# Patient Record
Sex: Male | Born: 1950
Health system: Southern US, Community
[De-identification: ages and names within clinical notes are randomized; demographics above are authoritative.]

## PROBLEM LIST (undated history)

## (undated) DIAGNOSIS — G459 Transient cerebral ischemic attack, unspecified: Secondary | ICD-10-CM

## (undated) DIAGNOSIS — Z89519 Acquired absence of unspecified leg below knee: Secondary | ICD-10-CM

## (undated) DIAGNOSIS — E119 Type 2 diabetes mellitus without complications: Secondary | ICD-10-CM

## (undated) DIAGNOSIS — E7800 Pure hypercholesterolemia, unspecified: Secondary | ICD-10-CM

## (undated) DIAGNOSIS — I10 Essential (primary) hypertension: Secondary | ICD-10-CM

## (undated) DIAGNOSIS — F329 Major depressive disorder, single episode, unspecified: Secondary | ICD-10-CM

## (undated) DIAGNOSIS — F32A Depression, unspecified: Secondary | ICD-10-CM

## (undated) DIAGNOSIS — I639 Cerebral infarction, unspecified: Secondary | ICD-10-CM

## (undated) DIAGNOSIS — F0390 Unspecified dementia without behavioral disturbance: Secondary | ICD-10-CM

## (undated) HISTORY — DX: Major depressive disorder, single episode, unspecified: F32.9

## (undated) HISTORY — DX: Acquired absence of unspecified leg below knee: Z89.519

## (undated) HISTORY — DX: Essential (primary) hypertension: I10

## (undated) HISTORY — DX: Cerebral infarction, unspecified: I63.9

## (undated) HISTORY — DX: Unspecified dementia, unspecified severity, without behavioral disturbance, psychotic disturbance, mood disturbance, and anxiety: F03.90

## (undated) HISTORY — PX: OTHER SURGICAL HISTORY: SHX169

## (undated) HISTORY — DX: Type 2 diabetes mellitus without complications: E11.9

## (undated) HISTORY — DX: Transient cerebral ischemic attack, unspecified: G45.9

## (undated) HISTORY — DX: Depression, unspecified: F32.A

## (undated) HISTORY — PX: SHOULDER SURGERY: SHX246

---

## 2007-06-25 ENCOUNTER — Ambulatory Visit (HOSPITAL_COMMUNITY): Payer: Self-pay | Admitting: Psychiatry

## 2007-06-30 ENCOUNTER — Ambulatory Visit (HOSPITAL_COMMUNITY): Payer: Self-pay | Admitting: Psychiatry

## 2007-08-20 ENCOUNTER — Ambulatory Visit (HOSPITAL_COMMUNITY): Admission: RE | Admit: 2007-08-20 | Discharge: 2007-08-20 | Payer: Self-pay | Admitting: Ophthalmology

## 2007-10-06 ENCOUNTER — Ambulatory Visit (HOSPITAL_COMMUNITY): Payer: Self-pay | Admitting: Psychiatry

## 2007-10-19 ENCOUNTER — Encounter: Admission: RE | Admit: 2007-10-19 | Discharge: 2007-11-19 | Payer: Self-pay | Admitting: Surgical Oncology

## 2007-10-20 ENCOUNTER — Ambulatory Visit (HOSPITAL_COMMUNITY): Payer: Self-pay | Admitting: Psychiatry

## 2008-02-09 ENCOUNTER — Ambulatory Visit (HOSPITAL_COMMUNITY): Payer: Self-pay | Admitting: Psychiatry

## 2008-02-19 ENCOUNTER — Ambulatory Visit (HOSPITAL_COMMUNITY): Payer: Self-pay | Admitting: Psychiatry

## 2008-06-01 ENCOUNTER — Ambulatory Visit (HOSPITAL_COMMUNITY): Payer: Self-pay | Admitting: Psychiatry

## 2008-06-07 ENCOUNTER — Ambulatory Visit (HOSPITAL_COMMUNITY): Payer: Self-pay | Admitting: Psychiatry

## 2008-06-15 ENCOUNTER — Ambulatory Visit (HOSPITAL_COMMUNITY): Payer: Self-pay | Admitting: Psychiatry

## 2008-08-04 ENCOUNTER — Ambulatory Visit (HOSPITAL_COMMUNITY): Payer: Self-pay | Admitting: Psychiatry

## 2008-08-23 ENCOUNTER — Ambulatory Visit (HOSPITAL_COMMUNITY): Payer: Self-pay | Admitting: Psychiatry

## 2008-09-20 ENCOUNTER — Ambulatory Visit (HOSPITAL_COMMUNITY): Payer: Self-pay | Admitting: Psychiatry

## 2008-10-31 ENCOUNTER — Ambulatory Visit: Payer: Self-pay | Admitting: Surgery

## 2009-02-07 ENCOUNTER — Ambulatory Visit (HOSPITAL_COMMUNITY): Payer: Self-pay | Admitting: Psychiatry

## 2009-02-28 ENCOUNTER — Ambulatory Visit (HOSPITAL_COMMUNITY): Payer: Self-pay | Admitting: Psychiatry

## 2009-03-20 ENCOUNTER — Ambulatory Visit (HOSPITAL_COMMUNITY): Payer: Self-pay | Admitting: Psychiatry

## 2009-03-30 ENCOUNTER — Ambulatory Visit (HOSPITAL_COMMUNITY): Payer: Self-pay | Admitting: Psychiatry

## 2009-04-11 ENCOUNTER — Ambulatory Visit (HOSPITAL_COMMUNITY): Payer: Self-pay | Admitting: Psychiatry

## 2009-04-27 ENCOUNTER — Ambulatory Visit (HOSPITAL_COMMUNITY): Payer: Self-pay | Admitting: Psychiatry

## 2009-05-25 ENCOUNTER — Ambulatory Visit (HOSPITAL_COMMUNITY): Payer: Self-pay | Admitting: Psychiatry

## 2009-06-29 ENCOUNTER — Ambulatory Visit (HOSPITAL_COMMUNITY): Payer: Self-pay | Admitting: Psychiatry

## 2009-07-07 ENCOUNTER — Ambulatory Visit (HOSPITAL_COMMUNITY): Payer: Self-pay | Admitting: Psychology

## 2009-07-27 ENCOUNTER — Ambulatory Visit (HOSPITAL_COMMUNITY): Payer: Self-pay | Admitting: Psychiatry

## 2009-08-03 ENCOUNTER — Ambulatory Visit (HOSPITAL_COMMUNITY): Payer: Self-pay | Admitting: Psychology

## 2009-08-23 ENCOUNTER — Ambulatory Visit (HOSPITAL_COMMUNITY): Payer: Self-pay | Admitting: Psychology

## 2009-09-07 ENCOUNTER — Ambulatory Visit (HOSPITAL_COMMUNITY): Payer: Self-pay | Admitting: Psychology

## 2009-09-21 ENCOUNTER — Ambulatory Visit (HOSPITAL_COMMUNITY): Payer: Self-pay | Admitting: Psychiatry

## 2009-10-12 ENCOUNTER — Ambulatory Visit (HOSPITAL_COMMUNITY): Payer: Self-pay | Admitting: Psychology

## 2009-11-02 ENCOUNTER — Ambulatory Visit (HOSPITAL_COMMUNITY): Payer: Self-pay | Admitting: Psychiatry

## 2009-11-30 ENCOUNTER — Ambulatory Visit (HOSPITAL_COMMUNITY): Payer: Self-pay | Admitting: Psychology

## 2009-12-21 ENCOUNTER — Ambulatory Visit (HOSPITAL_COMMUNITY): Payer: Self-pay | Admitting: Psychiatry

## 2010-02-22 ENCOUNTER — Ambulatory Visit (HOSPITAL_COMMUNITY): Admit: 2010-02-22 | Payer: Self-pay | Admitting: Psychiatry

## 2010-02-22 ENCOUNTER — Encounter (HOSPITAL_COMMUNITY): Payer: Self-pay | Admitting: Psychiatry

## 2010-03-05 ENCOUNTER — Encounter (INDEPENDENT_AMBULATORY_CARE_PROVIDER_SITE_OTHER): Payer: 59 | Admitting: Surgery

## 2010-03-05 ENCOUNTER — Other Ambulatory Visit (INDEPENDENT_AMBULATORY_CARE_PROVIDER_SITE_OTHER): Payer: 59

## 2010-03-05 DIAGNOSIS — E1159 Type 2 diabetes mellitus with other circulatory complications: Secondary | ICD-10-CM

## 2010-03-05 DIAGNOSIS — I63239 Cerebral infarction due to unspecified occlusion or stenosis of unspecified carotid arteries: Secondary | ICD-10-CM

## 2010-03-05 DIAGNOSIS — L98499 Non-pressure chronic ulcer of skin of other sites with unspecified severity: Secondary | ICD-10-CM

## 2010-03-06 ENCOUNTER — Encounter (HOSPITAL_COMMUNITY): Payer: Self-pay | Admitting: Psychiatry

## 2010-03-06 NOTE — Assessment & Plan Note (Signed)
OFFICE VISIT  Chad Grant, Chad Grant DOB:  1950-05-19                                       03/05/2010 CHART#:[id number]  REASON FOR CONSULT:  Right great toe ulcer.  REFERRING PHYSICIAN:  Jake Shark A. Tanda Rockers, M.D.  PRIMARY FOOT SPECIALIST:  Denny Peon. Ulice Brilliant, D.P.M.  HISTORY:  The patient is a very pleasant 60 year old gentleman who I am seeing at the request of Dr. Tanda Rockers for evaluation of a right great toe ulcer.  The patient states that the ulcer has been there since approximately October.  Dr. Ulice Brilliant performed treatments with Silvadene as well as Regranex.  However, this failed to get his wound to heal.  He recently saw Dr. Tanda Rockers who has performed debridement as well as a bone biopsy.  He has also started on hyperbaric oxygen.  The patient states that the wound is slowly getting better.  He denies fevers, chills, redness, drainage, or foul odor.  The patient suffers from diabetes.  His blood sugars are moderately well controlled; they are usually in the 150s.  He also is medically managed for his hypertension and hypercholesterolemia.  The patient had a history of stroke in 2010 which has left him with some memory difficulties.  He has also had a left below-knee amputation.  REVIEW OF SYSTEMS:  VASCULAR:  Positive for nonhealing ulcer and stroke. GI:  Positive for reflux. MUSCULOSKELETAL:  Positive for joint pain. PSYCH:  Positive for anxiety. SKIN:  Positive for right great toe ulcer. All other review of systems are negative.  PAST MEDICAL HISTORY:  Diabetes, hypertension, hypercholesterolemia, peripheral vascular disease, history of stroke in 2010.  SOCIAL HISTORY:  He is married with 2 children.  Does not smoke or drink.  Has never smoked.  FAMILY HISTORY:  Positive for stroke in a sister at age 47.  PHYSICAL EXAMINATION:  Vital signs:  Heart rate 68, blood pressure 137/71, temperature is 97.8.  General:  Well-appearing in no distress. HEENT:   Within normal limits.  Lungs:  Clear bilaterally.  No wheezes or rhonchi.  Cardiovascular:  Regular rate and rhythm.  No carotid bruits. I cannot palpate pedal pulses.  Abdomen:  Soft, nontender. Musculoskeletal:  Left below-knee amputation.  Neuro:  No focal deficits.  Skin:  Without rash.  There is an ulcer on the tip of right great toe which does not have any evidence of infection.  Vascular lab studies were performed.  Although his ABIs are near normal, he has dampened wave forms.  ASSESSMENT:  Right great toe ulcer.  PLAN:  I have discussed with the patient that while his Doppler study shows he has relatively good blood flow, the wave forms are somewhat dampened.  I feel like in the setting with a diabetic with nonhealing wound that we at least need to proceed with a formal angiogram to confirm the ultrasound findings or to determine if he needs revascularization.  In the meantime, we will continue with his wound care and hyperbaric therapy.  I have scheduled his angiogram for this Wednesday.  Two years ago, the patient had a stroke and had an ultrasound which showed approximately 70% left common carotid stenosis.  This was on the contralateral side of his stroke.  I do not believe this has been evaluated since that time.  We will get a formal carotid ultrasound today in the office.    Lala Lund  Basilio Cairo, MD Electronically Signed  VWB/MEDQ  D:  03/05/2010  T:  03/06/2010  Job:  [id number]  cc:   Kirstie Peri, MD Theresia Majors. Tanda Rockers, M.D. Denny Peon. Ulice Brilliant, D.P.M.

## 2010-03-07 ENCOUNTER — Ambulatory Visit (HOSPITAL_COMMUNITY)
Admission: RE | Admit: 2010-03-07 | Discharge: 2010-03-07 | Disposition: A | Discharge status: Home / Self Care | Payer: 59 | Source: Ambulatory Visit | Attending: Surgery | Admitting: Surgery

## 2010-03-07 DIAGNOSIS — Z01810 Encounter for preprocedural cardiovascular examination: Secondary | ICD-10-CM | POA: Insufficient documentation

## 2010-03-07 DIAGNOSIS — L97509 Non-pressure chronic ulcer of other part of unspecified foot with unspecified severity: Secondary | ICD-10-CM | POA: Insufficient documentation

## 2010-03-07 DIAGNOSIS — L98499 Non-pressure chronic ulcer of skin of other sites with unspecified severity: Secondary | ICD-10-CM

## 2010-03-07 DIAGNOSIS — Z01812 Encounter for preprocedural laboratory examination: Secondary | ICD-10-CM | POA: Insufficient documentation

## 2010-03-07 DIAGNOSIS — I739 Peripheral vascular disease, unspecified: Secondary | ICD-10-CM

## 2010-03-07 LAB — GLUCOSE, CAPILLARY: Glucose-Capillary: 130 mg/dL — ABNORMAL HIGH (ref 70–99)

## 2010-03-07 LAB — POCT I-STAT, CHEM 8
BUN: 15 mg/dL (ref 6–23)
Calcium, Ion: 1.22 mmol/L (ref 1.12–1.32)
Chloride: 105 meq/L (ref 96–112)
Creatinine, Ser: 1.1 mg/dL (ref 0.4–1.5)
Glucose, Bld: 129 mg/dL — ABNORMAL HIGH (ref 70–99)
HCT: 40 % (ref 39.0–52.0)
Hemoglobin: 13.6 g/dL (ref 13.0–17.0)
Potassium: 4.7 meq/L (ref 3.5–5.1)
Sodium: 138 meq/L (ref 135–145)
TCO2: 24 mmol/L (ref 0–100)

## 2010-03-13 NOTE — Procedures (Unsigned)
CAROTID DUPLEX EXAM  INDICATION:  CVA, carotid disease.  HISTORY: Diabetes:  Yes. Cardiac:  No. Hypertension:  No. Smoking: Previous Surgery: CV History:  CVA. Amaurosis Fugax No, Paresthesias No, Hemiparesis Yes.                                      RIGHT             LEFT Brachial systolic pressure:                           137 Brachial Doppler waveforms: Vertebral direction of flow:        Antegrade         Antegrade DUPLEX VELOCITIES (cm/sec) CCA peak systolic                   117               306 ECA peak systolic                   67                62 ICA peak systolic                   71                67 ICA end diastolic                   16                18 PLAQUE MORPHOLOGY:                  Heterogenous Heterogenous/smooth PLAQUE AMOUNT:                      Mild              Moderate PLAQUE LOCATION:                    ICA/ECA           CCA/ICA/ECA  IMPRESSION: 1. 1% to 39% bilateral internal carotid artery stenosis. 2. Bilateral external carotid artery waveforms appear slightly     abnormal. 3. Left common carotid artery is abnormal with significant plaquing. 4. Bilateral vertebral arteries are within normal limits.  ___________________________________________ V. Charlena Cross, MD  LT/MEDQ  D:  03/05/2010  T:  03/05/2010  Job:  [id number]

## 2010-03-19 ENCOUNTER — Encounter: Payer: Medicare Other | Admitting: Surgery

## 2010-03-20 ENCOUNTER — Encounter (INDEPENDENT_AMBULATORY_CARE_PROVIDER_SITE_OTHER): Payer: 59 | Admitting: Psychiatry

## 2010-03-20 DIAGNOSIS — F329 Major depressive disorder, single episode, unspecified: Secondary | ICD-10-CM

## 2010-03-25 NOTE — Op Note (Addendum)
  [name], [name]                ACCOUNT NO.:  [id number]  MEDICAL RECORD NO.:  [id number]           PATIENT TYPE:  O  LOCATION:  [facility]                         FACILITY:  [facility]  PHYSICIAN:  Juleen China IV, MDDATE OF BIRTH:  Jul 13, 1950  DATE OF PROCEDURE:  03/07/2010 DATE OF DISCHARGE:  03/07/2010                              OPERATIVE REPORT   PREOPERATIVE DIAGNOSIS:  Right toe ulcer.  POSTOPERATIVE DIAGNOSIS:  Right toe ulcer.  PROCEDURE PERFORMED: 1. Ultrasound access, left femoral artery. 2. Abdominal aortogram. 3. Right leg runoff. 4. Second-order catheterization.  INDICATIONS:  This is a 60 year old gentleman with history of left leg amputation, who has had a ulcer on his right toe.  He had duplex which showed monophasic waveforms, but normal near normal ankle-brachial indices.  He comes in today for formal angiographic evaluation and possible intervention.  PROCEDURE:  The patient was identified in the holding area and taken to room #8, placed supine on the table.  The both groins were prepped and draped in usual fashion.  Time-out was called.  The left femoral artery was evaluated with ultrasound and found to be widely patent.  A digital image was acquired.  Anesthetized with 1% lidocaine.  The left femoral artery was accessed under ultrasound guidance with an 18-gauge needle. An 0.035 wire was advanced into the aorta under fluoroscopic visualization.  A 5-French sheath was placed.  Over the wire, Omniflush catheter was advanced to the level of L1.  Abdominal aortogram was obtained.  Next, using Omniflush catheter and Bentson wire, the area of bifurcation was crossed.  Catheter was placed in the right external iliac artery and right leg runoff was performed.  FINDINGS:  Aortogram:  The visualized portions of suprarenal abdominal aorta showed no significant disease.  There are single renal arteries which are patent without significant stenosis bilaterally.   The infrarenal abdominal aorta is widely patent.  Bilateral common iliac arteries are widely patent.  Bilateral hypogastric arteries are widely patent.  Bilateral external iliac arteries are widely patent.  Right lower extremity:  The right common femoral artery is widely patent. The right profunda femoral artery is widely patent. The right superficial femoral artery is widely patent. The right popliteal artery is widely patent above and below the knee.  The anterior tibial and posterior tibial arteries are occluded.  There is a single-vessel runoff via the peroneal artery.  There is reconstitution of the dorsalis pedis at the ankle from the peroneal collaterals.  At this point in time, decision made to terminate the procedure.  No revascularization options were indicated.  The patient was taken to holding area for sheath pull.  IMPRESSION: 1. No significant aortoiliac occlusive disease. 2. Widely patent right superficial femoral and popliteal artery. 3. Single-vessel runoff via the peroneal artery, which reconstitutes     the dorsalis pedis at the ankle.     Chad Ny, MD     VWB/MEDQ  D:  03/07/2010  T:  03/07/2010  Job:  [id number]  Electronically Signed by Arelia Longest IV MD on 03/25/2010 07:39:41 PM

## 2010-03-26 ENCOUNTER — Encounter: Payer: Self-pay | Admitting: Surgery

## 2010-04-16 ENCOUNTER — Ambulatory Visit: Payer: 59 | Admitting: Surgery

## 2010-04-16 ENCOUNTER — Ambulatory Visit (INDEPENDENT_AMBULATORY_CARE_PROVIDER_SITE_OTHER): Payer: 59 | Admitting: Surgery

## 2010-04-16 DIAGNOSIS — L98499 Non-pressure chronic ulcer of skin of other sites with unspecified severity: Secondary | ICD-10-CM

## 2010-04-17 NOTE — Assessment & Plan Note (Signed)
OFFICE VISIT  Chad Grant, Chad Grant DOB:  February 11, 1950                                       04/16/2010 CHART#:[id number]  Patient comes back today for follow-up of his right great toe ulcer which has been there since approximately October.  He underwent an angiogram on 02/15, which showed a single vessel runoff via the peroneal artery which had a brisk collateral supply getting blood flow on the foot.  His anterior tibial and posterior tibial arteries were chronically occluded.  Based on his angiogram, I felt he should have adequate blood supply to heal his wound.  No percutaneous revascularization options were needed.  He comes back today for followup.  He has recently had the nail bed removed with concern over fungus.  He is undergoing continued hyperbaric oxygen treatments.  PHYSICAL EXAMINATION:  Heart rate 80, blood pressure 146/91, temperature 97.8.  Generally, he is well-appearing in no distress.  The left leg is surgically absent.  The right great toe has an area of ulceration where the nail bed has been removed.  There is some pink granulation tissue there as well as some areas of concern.  I cannot palpate a pulse today.  PLAN:  Based on the patient's blood supply from his angiogram, I would think he has adequate circulation to heal this wound.  However, there has been extensive soft tissue damage from his ulcer that may preclude its ability to heal.  He may require a toe amputation, which I would expect should heal; however, there is a possibility that it may not, and I reiterated this to the patient.  I agree with continuing local wound care as well as hyperbaric oxygen therapy to give this every opportunity to heal.  If it does not, I would recommend a great toe amputation.  I will let, at this point, Dr. Tanda Rockers contact me if he fails treatment at the wound center, and I would be happy to get re-involved with his toe ulcer.  Otherwise, I will plan on  seeing him back in a year for a follow- up carotid ultrasound.    Jorge Ny, MD Electronically Signed  VWB/MEDQ  D:  04/16/2010  T:  04/17/2010  Job:  [id number]  cc:   Jake Shark A. Tanda Rockers, M.D. Kirstie Peri, MD Denny Peon. Ulice Brilliant, D.P.M.

## 2010-06-05 NOTE — Assessment & Plan Note (Signed)
OFFICE VISIT   Seitzinger, Londell  DOB:  [birth year]                                       10/31/2008  CHART#:[id number]   REASON FOR VISIT:  Evaluate stroke.   REFERRING PHYSICIANS:  Kirstie Peri, MD   HISTORY:  This is a 60 year old gentleman who [place] developed dizziness and the inability to speak of, as well as  having problems dragging his left leg and left arm weakness.  His  symptoms lasted approximately 7-10 days.  His workup at the beach  included a carotid ultrasound which revealed normal internal carotid  arteries bilaterally with a likely near 70% stenosis of the left common  carotid artery.  A CT head showed a focal hypodensity in the right  thalamus compatible with age indeterminate lacunar infarct.  The MRI  reveals subacute right pontine infarct with a remote infarction in the  right thalamus and dorsal inferior right pons, with moderate chronic  microvascular ischemic changes and supratentorial brain.  The patient  was sent home on new medications including Plavix as well as blood  pressure and cholesterol medications.  Both his blood pressure and  cholesterol were found to be elevated.  He is referred to me for  evaluation of his carotid disease with regards to contributions to his  neurologic decline.  The patient is near his baseline.  He still does  have issues with memory according to his family.  His strength issues  have nearly resolved.   PAST MEDICAL HISTORY:  Diabetes, hypertension, and hypercholesterolemia  as well as history of stroke in September 2010.   PAST SURGICAL HISTORY:  Left below-knee amputation secondary to  complications of diabetes.  This was done in [place] in 2009.  He  had left shoulder surgery in December 2009 as well as cataract removal.   FAMILY HISTORY:  Positive for stroke in his sister at age 22.   SOCIAL HISTORY:  He is married with 2 children.  He is a retired  Estate agent.  He does not smoke, has never smoked.  He does not  drink alcohol currently.   REVIEW OF SYSTEMS:  GENERAL:  Positive for weight gain.  CARDIAC:  Negative for chest pain and chest tightness.  PULMONARY:  Negative shortness of breath, coughing up blood, or asthma.  GI:  Positive for reflux.  GU:  Positive for frequent urination.  VASCULAR:  Positive for stroke and slurred speech.  NEURO:  Positive dizziness.  ORTHO:  Negative.  PSYCH:  Positive for depression, nervousness.  ENT:  Negative.  HEM:  Negative.   MEDICATIONS:  Include Reglan, nortriptyline, Humulin, Plavix,  metoprolol, Zocor, Celexa, Pepcid.   ALLERGIES:  None.   PHYSICAL EXAMINATION:  Blood pressure 146/77, pulse 75.  General:  Sell-  appearing, in no distress.  HEENT:  Normocephalic, atraumatic.  Pupils  equal.  Sclerae anicteric.  Neck:  Supple.  No JVD.  No carotid bruits.  Cardiovascular:  Regular rate and rhythm.  No murmurs, rubs, or gallops.  Abdomen:  Soft, nontender.  Extremities:  Left leg is absent secondary  to amputation.  Right leg is warm and well-perfused.  Neuro:  Cranial  nerves II-XII are grossly intact.  Skin:  Without rash.   ASSESSMENT:  Right brain stroke.   PLAN:  The patient does not have significant extracranial  carotid  disease which would account for his symptoms.  His only finding on  ultrasound was a left common carotid lesion which was less than 70%.  I  think at this point he will need to be managed medically for his stroke.  I do not think his carotid disease contributed towards it.  I am going  to recommend sending him to Neurology for a complete stroke workup.  I  do not see an echo in his reports.  He is not going to need an echo to  rule out cardiac sources.  He will be maintained on his Plavix as well  as his blood pressure and cholesterol medication.  He will follow up  with me on a p.r.n. basis.   Jorge Ny, MD  Electronically Signed    VWB/MEDQ  D:  10/31/2008  T:  11/01/2008  Job:  [id number]   cc:   Kirstie Peri, MD

## 2010-06-14 ENCOUNTER — Encounter (HOSPITAL_COMMUNITY): Payer: 59 | Admitting: Psychiatry

## 2010-06-21 ENCOUNTER — Encounter (INDEPENDENT_AMBULATORY_CARE_PROVIDER_SITE_OTHER): Payer: 59 | Admitting: Psychiatry

## 2010-06-21 DIAGNOSIS — F329 Major depressive disorder, single episode, unspecified: Secondary | ICD-10-CM

## 2010-09-11 ENCOUNTER — Encounter (INDEPENDENT_AMBULATORY_CARE_PROVIDER_SITE_OTHER): Payer: 59 | Admitting: Psychiatry

## 2010-09-11 DIAGNOSIS — F329 Major depressive disorder, single episode, unspecified: Secondary | ICD-10-CM

## 2010-09-12 ENCOUNTER — Encounter (INDEPENDENT_AMBULATORY_CARE_PROVIDER_SITE_OTHER): Payer: 59 | Admitting: Psychology

## 2010-09-12 DIAGNOSIS — F0630 Mood disorder due to known physiological condition, unspecified: Secondary | ICD-10-CM

## 2010-09-12 DIAGNOSIS — F09 Unspecified mental disorder due to known physiological condition: Secondary | ICD-10-CM

## 2010-09-13 ENCOUNTER — Encounter (HOSPITAL_COMMUNITY): Payer: 59 | Admitting: Psychiatry

## 2010-09-13 ENCOUNTER — Encounter (HOSPITAL_COMMUNITY): Payer: 59 | Admitting: Psychology

## 2010-09-18 ENCOUNTER — Encounter (HOSPITAL_COMMUNITY): Payer: 59 | Admitting: Psychiatry

## 2010-10-04 ENCOUNTER — Encounter (INDEPENDENT_AMBULATORY_CARE_PROVIDER_SITE_OTHER): Payer: 59 | Admitting: Psychiatry

## 2010-10-04 DIAGNOSIS — F329 Major depressive disorder, single episode, unspecified: Secondary | ICD-10-CM

## 2010-10-12 ENCOUNTER — Encounter (INDEPENDENT_AMBULATORY_CARE_PROVIDER_SITE_OTHER): Payer: 59 | Admitting: Psychology

## 2010-10-12 DIAGNOSIS — F0630 Mood disorder due to known physiological condition, unspecified: Secondary | ICD-10-CM

## 2010-10-12 DIAGNOSIS — F09 Unspecified mental disorder due to known physiological condition: Secondary | ICD-10-CM

## 2010-10-19 LAB — BASIC METABOLIC PANEL
BUN: 14
GFR calc non Af Amer: >60
Potassium: 4.2
Sodium: 137

## 2010-10-19 LAB — HEMOGLOBIN AND HEMATOCRIT, BLOOD: HCT: 42.2

## 2010-10-19 LAB — HEMOGLOBIN A1C: Hgb A1c MFr Bld: 6.3 — ABNORMAL HIGH

## 2010-11-06 ENCOUNTER — Encounter (HOSPITAL_COMMUNITY): Payer: 59 | Admitting: Psychiatry

## 2010-11-08 ENCOUNTER — Encounter (INDEPENDENT_AMBULATORY_CARE_PROVIDER_SITE_OTHER): Payer: BC Managed Care – PPO | Admitting: Psychology

## 2010-11-08 DIAGNOSIS — F0630 Mood disorder due to known physiological condition, unspecified: Secondary | ICD-10-CM

## 2010-11-08 DIAGNOSIS — F09 Unspecified mental disorder due to known physiological condition: Secondary | ICD-10-CM

## 2010-12-25 ENCOUNTER — Encounter (HOSPITAL_COMMUNITY): Payer: BC Managed Care – PPO | Admitting: Psychiatry

## 2010-12-25 ENCOUNTER — Ambulatory Visit (INDEPENDENT_AMBULATORY_CARE_PROVIDER_SITE_OTHER): Payer: BC Managed Care – PPO | Admitting: Psychiatry

## 2010-12-25 DIAGNOSIS — F329 Major depressive disorder, single episode, unspecified: Secondary | ICD-10-CM

## 2010-12-25 MED ORDER — LAMOTRIGINE 100 MG PO TABS: 100.0000 mg | ORAL_TABLET | Freq: Every day | ORAL | Status: DC

## 2010-12-25 MED ORDER — BUPROPION HCL ER (SR) 150 MG PO TB12: 150.0000 mg | ORAL_TABLET | Freq: Every day | ORAL | Status: DC

## 2010-12-25 NOTE — Progress Notes (Signed)
Patient came for his followup appointment with his wife. He has been compliant with his medication. He reported no side effects including any rash or itching with Lamictal. He had a good Thanksgiving he went to his sister and enjoy the day. He is sleeping better. He denies any agitation anger or mood swings. Wife also endorsed that his behavior has much improved and he's been more compliant with his medication. He is not taking Ambien every night and sleeping better without Ambien.  Mental status examination She is calm cooperative pleasant. He maintained fair eye contact. His speech is slow but coherent. He has poverty of thought content but he denies any paranoid thinking or delusions. There are no psychotic symptoms present. He denies any active or passive suicidal thinking homicidal thinking. He denies any auditory or visual hallucination. He's alert and oriented x3. He uses cane for his walking. His insight judgment and impulse control is fair.  Assessment Depressive disorder NOS  Plan I will continue his Lamictal 100 mg daily Wellbutrin SR 150 mg daily. Prescription for 90 days per given today. I have explained risks and benefits of medication in detail. I will see him again in 2 months

## 2010-12-29 ENCOUNTER — Other Ambulatory Visit (HOSPITAL_COMMUNITY): Payer: Self-pay | Admitting: Psychiatry

## 2011-01-22 DIAGNOSIS — I639 Cerebral infarction, unspecified: Secondary | ICD-10-CM

## 2011-01-22 HISTORY — DX: Cerebral infarction, unspecified: I63.9

## 2011-01-31 ENCOUNTER — Ambulatory Visit (HOSPITAL_COMMUNITY): Payer: BC Managed Care – PPO | Admitting: Psychiatry

## 2011-02-13 ENCOUNTER — Ambulatory Visit (INDEPENDENT_AMBULATORY_CARE_PROVIDER_SITE_OTHER): Payer: BC Managed Care – PPO | Admitting: Psychiatry

## 2011-02-13 DIAGNOSIS — F329 Major depressive disorder, single episode, unspecified: Secondary | ICD-10-CM

## 2011-02-18 NOTE — Patient Instructions (Signed)
Discussed orally 

## 2011-02-18 NOTE — Progress Notes (Signed)
Patient:  Chad Grant   DOB: 1950/02/21  MR Number: [id number]  [facility]  [address] [place] [address] [place],  [place], [address]  Start: Wednesday 02/13/2011 3:00 PM End: Wednesday 02/13/2011 3:50 PM  Provider/Observer:     Florencia Reasons, MSW, LCSW   Chief Complaint:      Chief Complaint  Patient presents with  . Depression    Reason For Service:     The patient is referred for services by Dr.Arfeen to improve coping skills. Patient has severe diabetes and lost his leg and 2009 due to to complications of diabetes. The patient has had difficulty coping with this as well as issues related to having a stroke in 2010. His wife reports the patient has no motivation to take care of his health and does not follow instructions regarding his health care such as using his insulin and testing his blood sugar at scheduled times. She also reports that patient is not motivated to perform tasks he can perform at home. The patient expresses frustration with his wife as he reports that she does not understand and that she does not see what he does.  Interventions Strategy:  Supportive therapy  Participation Level:   Active  Participation Quality:  Attentive      Behavioral Observation:  Casual, Alert, and Appropriate.   Current Psychosocial Factors: Patient and his wife have marital discord.  Content of Session:   Establishing rapport, reviewing symptoms, processing feelings, identifying ways to improve self-care  Current Status:   The patient is experiencing sadness, poor motivation, and anger.  Patient Progress:   Fair. The patient expresses frustration about having diabetes, having to take insulin, and having to stick his finger. Patient reports this is very painful. He states getting tired of having to hurt himself. He also expresses frustration with his wife as he states she does not see what he really does. He admits poor motivation regarding doing activities. However he is in  agreement to try to increase physical activity and at effort to help manage diabetes.  Target Goals:   Improve coping and stress management skills  Last Reviewed:   02/13/2011  Goals Addressed Today:    Improve coping and stress management skills  Impression/Diagnosis:   The patient initially was referred to this practice due to the experiencing depressed mood, crying spells and increased emotionality along with sleep difficulty and loss of appetite shortly after his left leg was amputated in 2009 due to complications from diabetes. In 2010, patient suffered a stroke. The patient currently is experiencing anger, irritability, loss of interest in activities, and poor motivation. Diagnoses depressive disorder NOS  Diagnosis:  Axis I:  1. Depressive disorder             Axis II: Deferred

## 2011-02-26 ENCOUNTER — Ambulatory Visit (HOSPITAL_COMMUNITY): Payer: BC Managed Care – PPO | Admitting: Psychiatry

## 2011-03-12 ENCOUNTER — Encounter (HOSPITAL_COMMUNITY): Payer: Self-pay | Admitting: Psychiatry

## 2011-03-12 ENCOUNTER — Ambulatory Visit (INDEPENDENT_AMBULATORY_CARE_PROVIDER_SITE_OTHER): Payer: BC Managed Care – PPO | Admitting: Psychiatry

## 2011-03-12 DIAGNOSIS — F329 Major depressive disorder, single episode, unspecified: Secondary | ICD-10-CM

## 2011-03-12 NOTE — Patient Instructions (Signed)
Discussed orally 

## 2011-03-12 NOTE — Progress Notes (Signed)
Patient:  Chad Grant   DOB: 1950-04-07  MR Number: [id number]  Location: [facility]:  [address] [place] [place],  [place], [address]  Start: Tuesday 03/12/2011 9:35 AM End: Tuesday 03/12/2011 10:20 AM  Provider/Observer:     Florencia Reasons, MSW, LCSW   Chief Complaint:      Chief Complaint  Patient presents with  . Depression    Reason For Service:     The patient is referred for services by Dr.Arfeen to improve coping skills. Patient has severe diabetes and lost his leg and 2009 due to to complications of diabetes. The patient has had difficulty coping with this as well as issues related to having a stroke in 2010. His wife reports the patient has no motivation to take care of his health and does not follow instructions regarding his health care such as using his insulin and testing his blood sugar at scheduled times. She also reports that patient is not motivated to perform tasks he can perform at home. The patient expresses frustration with his wife as he reports that she does not understand and that she does not see what he does.  Interventions Strategy:  Supportive therapy  Participation Level:   Active  Participation Quality:  Attentive      Behavioral Observation:  Casual, Alert, and Appropriate.   Current Psychosocial Factors: Patient and his wife have marital discord.  Content of Session:    reviewing symptoms, processing feelings, identifying ways to improve self-care and medication compliance  Current Status:   The patient is experiencing sadness, poor motivation, and worry  Patient Progress:   Fair. The patient expresses continued frustration about having diabetes, having to take insulin, and having to stick his finger. Although he has been prescribed a different medication that requires him to do an injection once a day and instead of 3 times a day, he continues stating that it is painful and getting tired of having to hurt himself. His wife reports that patient  does go days without taking his insulin. He  expresses continued frustration with his wife as he states she does not see what he really does her see him every time he takes his medication. However, he does verbalize today that he knows his wife is just concerned.  He admits poor motivation regarding doing activities but states that he does plan to use treadmill to increase exercise. The patient expresses worry about his son who is in an interracial marriage. He worries about how people may treat his son and daughter-in-law due to negative experiences patient has had as well as negative experiences his son and daughter-in-law have shared with patient. He is happy about his 74 month old granddaughter.  Target Goals:   Improve coping and stress management skills  Last Reviewed:   02/13/2011  Goals Addressed Today:    Improve coping and stress management skills  Impression/Diagnosis:   The patient initially was referred to this practice due to the experiencing depressed mood, crying spells and increased emotionality along with sleep difficulty and loss of appetite shortly after his left leg was amputated in 2009 due to complications from diabetes. In 2010, patient suffered a stroke. The patient currently is experiencing anger, irritability, loss of interest in activities, and poor motivation. Diagnoses depressive disorder NOS  Diagnosis:  Axis I:  1. Depressive disorder             Axis II: Deferred

## 2011-03-19 ENCOUNTER — Encounter (HOSPITAL_COMMUNITY): Payer: Self-pay | Admitting: Psychiatry

## 2011-03-19 ENCOUNTER — Ambulatory Visit (INDEPENDENT_AMBULATORY_CARE_PROVIDER_SITE_OTHER): Payer: BC Managed Care – PPO | Admitting: Psychiatry

## 2011-03-19 DIAGNOSIS — F329 Major depressive disorder, single episode, unspecified: Secondary | ICD-10-CM

## 2011-03-19 DIAGNOSIS — I10 Essential (primary) hypertension: Secondary | ICD-10-CM | POA: Insufficient documentation

## 2011-03-19 MED ORDER — BUPROPION HCL ER (SR) 150 MG PO TB12: 150.0000 mg | ORAL_TABLET | Freq: Every day | ORAL | Status: DC

## 2011-03-19 MED ORDER — LAMOTRIGINE 150 MG PO TABS: 150.0000 mg | ORAL_TABLET | Freq: Every day | ORAL | Status: DC

## 2011-03-19 NOTE — Progress Notes (Signed)
Chief complaint I have difficulty controlling my temper  History of present illness Patient is 61 year old [place] man who came with his wife for a followup appointment. Patient endorse recently he has noticed increased agitation anger and difficulty to control his temper. He admitted poor sleep and racing thoughts. He do not know what triggered the symptoms. Wife also endorse that patient having mood swings. He is compliant with the medication and reported no side effects. He denies any itching rash with Lamictal. She denies any active or passive suicidal thinking.  Current psychiatric medication Lamictal 100 mg daily Wellbutrin SR 150 mg daily  Medical history Patient has history of CVA with weakness on one side. Patient has diabetes, hypertension and hyperlipidemia. Patient sees Dr. Sherryll Burger in [place].  Mental status examination Patient is casually dressed and fairly groomed. He maintained poor eye contact. He is somewhat guarded. His speech is soft with poverty of thought content. He denies any paranoid thinking or any delusions. He denies any active or passive suicidal thinking and homicidal thinking. His attention and concentration is fair. He described his mood is depressed and anxious. His affect is constricted. He is alert and oriented x3. His insight judgment and impulse control is okay.  Assessment Axis I Major depressive disorder Axis II deferred Axis III see medical history Axis IV moderate  Plan I will increase his Lamictal to 150 mg. He will continue Wellbutrin as prescribed. I have explained risks and benefits of medication including rash in that case he needs to stop medicine and call us immediately. He will continue to see therapist regularly. I will see him again in 6 weeks

## 2011-04-18 ENCOUNTER — Ambulatory Visit (HOSPITAL_COMMUNITY): Payer: BC Managed Care – PPO | Admitting: Psychiatry

## 2011-04-22 ENCOUNTER — Ambulatory Visit: Payer: Medicare Other | Admitting: Surgery

## 2011-04-29 ENCOUNTER — Ambulatory Visit: Payer: Medicare Other | Admitting: Surgery

## 2011-04-29 ENCOUNTER — Ambulatory Visit: Payer: Self-pay | Admitting: Surgery

## 2011-05-02 ENCOUNTER — Ambulatory Visit (INDEPENDENT_AMBULATORY_CARE_PROVIDER_SITE_OTHER): Payer: BC Managed Care – PPO | Admitting: Psychiatry

## 2011-05-02 ENCOUNTER — Encounter (HOSPITAL_COMMUNITY): Payer: Self-pay | Admitting: Psychiatry

## 2011-05-02 DIAGNOSIS — F329 Major depressive disorder, single episode, unspecified: Secondary | ICD-10-CM

## 2011-05-02 MED ORDER — LAMOTRIGINE 100 MG PO TABS: 100.0000 mg | ORAL_TABLET | Freq: Every day | ORAL | Status: DC

## 2011-05-02 MED ORDER — BUPROPION HCL ER (SR) 150 MG PO TB12: 150.0000 mg | ORAL_TABLET | Freq: Every day | ORAL | Status: DC

## 2011-05-02 NOTE — Progress Notes (Signed)
Chief complaint Medication management followup   History of present illness Patient is 61 year old [place] man who came with his wife for a followup appointment.  As per wife patient tried Lamictal 150 mg but he became very tired and groggy and stopped taking .  Patient now taking Lamictal 100 mg and doing much better.  Has provided patient does not have any severe agitation anger or mood swings.  He sleeps good.  He is happy as he getting new prosthesis and hoping it will fit better and help walking .  Patient occasional complain of poor sleep and takes Ambien as needed .  Patient denies any side effects of medication.  He denies any rash or itching.  He denies any active or passive suicidal thinking.  His appetite is unchanged.  Current psychiatric medication Lamictal 100 mg daily Wellbutrin SR 150 mg daily  Medical history Patient has history of CVA with weakness on one side. Patient has diabetes, hypertension and hyperlipidemia. Patient sees Dr. Sherryll Burger in [place].  Mental status examination Patient is casually dressed and fairly groomed. He maintained poor eye contact. He described his mood okay and his affect is neutral.  His speech is soft with poverty of thought content. He denies any paranoid thinking or any delusions. He denies any active or passive suicidal thinking and homicidal thinking. His attention and concentration is fair. He described his mood is depressed and anxious. His affect is constricted. He is alert and oriented x3. His insight judgment and impulse control is okay.  Assessment Axis I Major depressive disorder Axis II deferred Axis III see medical history Axis IV moderate  Plan I will discontinue Lamictal 150 mg and restart Lamictal 100 mg daily.  He will continue Wellbutrin SR 150 mg daily.  At this time patient not experiencing any side effects of medication.  I explained risks and benefits of medication in detail.  I recommended to call us if he has any  question or concern or if he feel worsening of her symptoms.  I will see him again in 3 months.

## 2011-08-01 ENCOUNTER — Ambulatory Visit (HOSPITAL_COMMUNITY): Payer: BC Managed Care – PPO | Admitting: Psychiatry

## 2011-08-15 ENCOUNTER — Ambulatory Visit (INDEPENDENT_AMBULATORY_CARE_PROVIDER_SITE_OTHER): Payer: BC Managed Care – PPO | Admitting: Psychiatry

## 2011-08-15 ENCOUNTER — Encounter (HOSPITAL_COMMUNITY): Payer: Self-pay | Admitting: Psychiatry

## 2011-08-15 DIAGNOSIS — F329 Major depressive disorder, single episode, unspecified: Secondary | ICD-10-CM

## 2011-08-15 MED ORDER — LAMOTRIGINE 150 MG PO TABS: 150.0000 mg | ORAL_TABLET | Freq: Every day | ORAL | Status: DC

## 2011-08-15 MED ORDER — BUPROPION HCL ER (SR) 150 MG PO TB12: 150.0000 mg | ORAL_TABLET | Freq: Every day | ORAL | Status: DC

## 2011-08-15 NOTE — Progress Notes (Signed)
Chief complaint Medication management followup   History of present illness Patient is 61 year old [place] man who came with his wife for a followup appointment.  As per wife patient started to shows some mood irritability and angry episodes. He is not motivated to do anything.  At time she needs to encourage him for his insulin compliance.  Patient admitted sometime he does not take insulin however he is been compliant with his Wellbutrin and Lamictal.  On his last visit we had cut down the Lamictal as patient was complaining of excessive sedation but recently patient and his wife notice more irritability agitation anger and depression.  He is not motivated do anything.  He's not sleeping very well.  He is up all night and then he sleeps during the day.  He denies any side effects of medication.  He is not taking any Ambien.  He's not drinking or using any illegal substance.  Current psychiatric medication Lamictal 100 mg daily Wellbutrin SR 150 mg daily  Psychiatric history Patient has been seeing in this office since 2009.  She was referred by his primary care physician.  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  Since then patient has been notice very depressed and isolated.  He denies any history of inpatient psychiatric treatment or any suicidal attempt.  In the past he had tried Depakote Celexa Xanax temazepam Cymbalta nortriptyline Ritalin however patient either due to side effects or had a poor response with these medication.  Family history Patient denies any history of psychiatric illness.  Medical history Patient has history of CVA with weakness on one side. Patient has diabetes, hypertension and hyperlipidemia. Patient sees Dr. Sherryll Burger in [place].  Mental status examination Patient is casually dressed and fairly groomed. He maintained poor eye contact. He described his mood irritable and his affect is mood appropriate.  His speech is slow and he has  poverty of thought content.  He denies any paranoid thinking or any delusions. He denies any active or passive suicidal thinking and homicidal thinking. His attention and concentration is fair.  He denies any auditory or visual hallucination.  There were no delusion present.  He is difficult to engage in conversation.  He is alert and oriented x3. His insight judgment and impulse control is okay.  Assessment Axis I Major depressive disorder Axis II deferred Axis III see medical history Axis IV moderate  Plan I will increase his Lamictal to 50 mg which he was taking before.  I will continue his Wellbutrin at present does.  I recommend to see therapist however patient declined.  Recommend to call us if he has any question or concern about the medication or if he feel worsening of the symptoms.  I will see him again in 3 months.

## 2011-11-14 ENCOUNTER — Ambulatory Visit (INDEPENDENT_AMBULATORY_CARE_PROVIDER_SITE_OTHER): Payer: BC Managed Care – PPO | Admitting: Psychiatry

## 2011-11-14 ENCOUNTER — Encounter (HOSPITAL_COMMUNITY): Payer: Self-pay | Admitting: Psychiatry

## 2011-11-14 DIAGNOSIS — F329 Major depressive disorder, single episode, unspecified: Secondary | ICD-10-CM

## 2011-11-14 MED ORDER — BUPROPION HCL ER (SR) 150 MG PO TB12: 150.0000 mg | ORAL_TABLET | Freq: Every day | ORAL | Status: DC

## 2011-11-14 MED ORDER — LAMOTRIGINE 150 MG PO TABS: 150.0000 mg | ORAL_TABLET | Freq: Every day | ORAL | Status: DC

## 2011-11-14 NOTE — Progress Notes (Signed)
Chief complaint Medication management followup   History of present illness Patient came with his wife for his followup appointment.  On his last visit we increased Lamictal to 150 mg.  Patient is tolerating the medication without any side effects.  He does not have any tremors shakes itching or rash.  As per wife he is more calm however he continues to have episodic irritability.  His been compliant with his oral hypoglycemic medication but does not take insulin as prescribed.  Patient endorse much improvement in his sleep and depression.  He still has social isolation, decreased energy and decreased motivation to do things but he denies any anger crying spells or any active or passive suicidal thoughts.  He wants to continue his current psychiatric medication.  He's not drinking or using any illegal substance.  He saw his primary care physician and his hemoglobin A1c is 7.1.  Current psychiatric medication Lamictal 150 mg daily Wellbutrin SR 150 mg daily  Psychiatric history Patient has been seeing in this office since 2009.  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  Since then patient has been notice very depressed and isolated.  He denies any history of inpatient psychiatric treatment or any suicidal attempt.  In the past he had tried Depakote Celexa Xanax temazepam Cymbalta nortriptyline Ritalin however patient either due to side effects or had a poor response with these medication.  Family history Patient denies any history of psychiatric illness.  Medical history Patient has history of CVA with weakness on one side. Patient has diabetes, hypertension and hyperlipidemia. Patient sees Dr. Sherryll Burger in [place].  His last hemoglobin A1c is 7.1  Mental status examination Patient is casually dressed and fairly groomed. He maintained fair eye contact. He described his mood better and his affect is improved from the past. His speech is slow but he has poverty of thought  content.  He denies any paranoid thinking or any delusions. He denies any active or passive suicidal thinking and homicidal thinking. His attention and concentration is fair.  He denies any auditory or visual hallucination.  There were no delusion present.  He is difficult to engage in conversation.  He is alert and oriented x3. His insight judgment and impulse control is okay.  Assessment Axis I Major depressive disorder Axis II deferred Axis III see medical history Axis IV moderate  Plan I will continue his current Lamictal and Wellbutrin.  At this time patient does not have any side effects.  I explain in detail the risk and benefits of medication and recommend to call us if he is any question or concern about the medication .  I also informed that he will see a new psychiatrist since moving full-time [place].  He will see Dr. Dan Humphreys in 3 months.

## 2012-01-17 ENCOUNTER — Encounter: Payer: Self-pay | Admitting: Vascular Surgery

## 2012-02-11 ENCOUNTER — Encounter (HOSPITAL_COMMUNITY): Payer: Self-pay | Admitting: Psychiatry

## 2012-02-11 ENCOUNTER — Ambulatory Visit (INDEPENDENT_AMBULATORY_CARE_PROVIDER_SITE_OTHER): Payer: Medicare Other | Admitting: Psychiatry

## 2012-02-11 VITALS — Wt 166.0 lb

## 2012-02-11 DIAGNOSIS — F329 Major depressive disorder, single episode, unspecified: Secondary | ICD-10-CM

## 2012-02-11 DIAGNOSIS — E1159 Type 2 diabetes mellitus with other circulatory complications: Secondary | ICD-10-CM

## 2012-02-11 DIAGNOSIS — E1151 Type 2 diabetes mellitus with diabetic peripheral angiopathy without gangrene: Secondary | ICD-10-CM | POA: Insufficient documentation

## 2012-02-11 DIAGNOSIS — I70209 Unspecified atherosclerosis of native arteries of extremities, unspecified extremity: Secondary | ICD-10-CM

## 2012-02-11 MED ORDER — BUPROPION HCL ER (SR) 150 MG PO TB12: 150.0000 mg | ORAL_TABLET | Freq: Every day | ORAL | Status: DC

## 2012-02-11 MED ORDER — LAMOTRIGINE 150 MG PO TABS: 150.0000 mg | ORAL_TABLET | Freq: Every day | ORAL | Status: DC

## 2012-02-11 NOTE — Patient Instructions (Signed)
It could be helpful to sort out what exactly you want.  All of the behavior demonstrated in the office setting and described by wife indicate that you want yourself dead.  Call if problems or concerns.

## 2012-02-11 NOTE — Progress Notes (Signed)
[facility] [id number] Progress Note Chad Grant MRN: [id number] DOB: 1950/08/02 Age: 62 y.o.  Date: 02/11/2012 Start Time: 2:20 PM End Time: 2:42 PM  Chief Complaint: Chief Complaint  Patient presents with  . Depression  . Follow-up  . Medication Refill   Subjective: "I'm doing okay".  History of present illness Patient came with his wife for his followup appointment.  Pt reports that he is compliant with the psychotropic medications with good benefit and no noticeable side effects.  Pt comes to the appointment with a very lack luster presentation.  His wife reports that he is non compliant with most recommendations for his diabetes.    Current psychiatric medication Lamictal 150 mg daily Wellbutrin SR 150 mg daily  Psychiatric history Patient has been seeing in this office since 2009.  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  Since then patient has been notice very depressed and isolated.  He denies any history of inpatient psychiatric treatment or any suicidal attempt.  In the past he had tried Depakote Celexa Xanax temazepam Cymbalta nortriptyline Ritalin however patient either due to side effects or had a poor response with these medication.  Family history Patient denies any history of psychiatric illness. family history includes Alcohol abuse in his brother.  There is no history of Anxiety disorder, and Bipolar disorder, and Dementia, and Depression, and Drug abuse, and Paranoid behavior, and Schizophrenia, and OCD, and Seizures, and Sexual abuse, and Physical abuse, and ADD / ADHD, .  Medical history Patient has history of CVA with weakness on one side. Patient has diabetes, hypertension and hyperlipidemia. Patient sees Dr. Sherryll Burger in [place].  His last hemoglobin A1c is 7.1  Mental status examination Patient is casually dressed and fairly groomed. He maintained fair eye contact. He described his mood better and his affect is improved from  the past. His speech is slow but he has poverty of thought content.  He denies any paranoid thinking or any delusions. He denies any active or passive suicidal thinking and homicidal thinking. His attention and concentration is fair.  He denies any auditory or visual hallucination.  There were no delusion present.  He is difficult to engage in conversation.  He is alert and oriented x3. His insight judgment and impulse control is okay.  Assessment Axis I Major depressive disorder Axis II deferred Axis III see medical history Axis IV moderate  Plan/Discussion/Summary: I took his vitals.  I reviewed CC, tobacco/med/surg Hx, meds effects/ side effects, problem list, therapies and responses as well as current situation/symptoms discussed options. See orders and pt instructions for more details.  Orson Aloe, MD, [facility]

## 2012-04-14 ENCOUNTER — Ambulatory Visit (HOSPITAL_COMMUNITY): Payer: Self-pay | Admitting: Psychiatry

## 2012-04-17 ENCOUNTER — Other Ambulatory Visit: Payer: Self-pay

## 2012-04-17 ENCOUNTER — Encounter: Payer: Self-pay | Admitting: Surgery

## 2012-04-17 DIAGNOSIS — Z48812 Encounter for surgical aftercare following surgery on the circulatory system: Secondary | ICD-10-CM

## 2012-04-20 ENCOUNTER — Ambulatory Visit (INDEPENDENT_AMBULATORY_CARE_PROVIDER_SITE_OTHER): Payer: BC Managed Care – PPO | Admitting: Surgery

## 2012-04-20 ENCOUNTER — Other Ambulatory Visit: Payer: Self-pay

## 2012-04-20 ENCOUNTER — Encounter: Payer: Self-pay | Admitting: Surgery

## 2012-04-20 ENCOUNTER — Other Ambulatory Visit (INDEPENDENT_AMBULATORY_CARE_PROVIDER_SITE_OTHER): Payer: BC Managed Care – PPO | Admitting: *Deleted

## 2012-04-20 VITALS — BP 139/116 | HR 70 | Ht 69.5 in | Wt 169.0 lb

## 2012-04-20 DIAGNOSIS — Z48812 Encounter for surgical aftercare following surgery on the circulatory system: Secondary | ICD-10-CM

## 2012-04-20 DIAGNOSIS — I6529 Occlusion and stenosis of unspecified carotid artery: Secondary | ICD-10-CM | POA: Insufficient documentation

## 2012-04-20 DIAGNOSIS — I7025 Atherosclerosis of native arteries of other extremities with ulceration: Secondary | ICD-10-CM | POA: Insufficient documentation

## 2012-04-20 DIAGNOSIS — L98499 Non-pressure chronic ulcer of skin of other sites with unspecified severity: Secondary | ICD-10-CM

## 2012-04-20 DIAGNOSIS — I739 Peripheral vascular disease, unspecified: Secondary | ICD-10-CM | POA: Insufficient documentation

## 2012-04-20 NOTE — Progress Notes (Signed)
[facility] and Vein Specialist of [facility]   Patient name: Chad Grant MRN: [id number] DOB: Aug 05, 1950 Sex: male     Chief Complaint  Patient presents with  . Re-evaluation    non healing ulcer R foot - Dr. Tanda Rockers est pt last OV 04/16/2010    HISTORY OF PRESENT ILLNESS: The patient is here for evaluation of a right third toe ulcer. I have seen him in the past for a right great toe ulcer. This went on to require amputation. He did undergo angiography which revealed no significant aortoiliac occlusive disease no significant outflow disease, and single-vessel runoff via the peroneal artery which reconstitutes a dorsalis pedis. He was able to heal 8 great toe amputation. He states that he bumped his right third toe under donor about 2 weeks ago. He has been seen at the wound center. He has developed necrotic tissue at the wound. He is sent for vascular evaluation. He is previously undergone a left leg amputation at [facility]  The patient suffers from type 2 diabetes. This is managed with oral medication. He is on Zocor for hypercholesterolemia.  Past Medical History  Diagnosis Date  . Diabetes mellitus type II   . Hypertension   . Stroke   . Depression   . S/P BKA (below knee amputation) unilateral     Past Surgical History  Procedure Laterality Date  . Left leg amputa    . Left leg amputated    . Shoulder surgery    . Cataract removed      History   Social History  . Marital Status: Married    Spouse Name: N/A    Number of Children: N/A  . Years of Education: N/A   Occupational History  . Not on file.   Social History Main Topics  . Smoking status: Never Smoker   . Smokeless tobacco: Never Used  . Alcohol Use: No  . Drug Use: No  . Sexually Active: Not on file   Other Topics Concern  . Not on file   Social History Narrative  . No narrative on file    Family History  Problem Relation Age of Onset  . Alcohol abuse Brother   . Anxiety disorder Neg Hx   .  Bipolar disorder Neg Hx   . Dementia Neg Hx   . Depression Neg Hx   . Drug abuse Neg Hx   . Paranoid behavior Neg Hx   . Schizophrenia Neg Hx   . OCD Neg Hx   . Seizures Neg Hx   . Sexual abuse Neg Hx   . Physical abuse Neg Hx   . ADD / ADHD Neg Hx     Allergies as of 04/20/2012  . (No Known Allergies)    Current Outpatient Prescriptions on File Prior to Visit  Medication Sig Dispense Refill  . buPROPion (WELLBUTRIN SR) 150 MG 12 hr tablet Take 1 tablet (150 mg total) by mouth daily.  90 tablet  0  . clopidogrel (PLAVIX) 75 MG tablet Take 75 mg by mouth daily.      . insulin glargine (LANTUS) 100 UNIT/ML injection Inject into the skin at bedtime.      . lamoTRIgine (LAMICTAL) 150 MG tablet Take 1 tablet (150 mg total) by mouth daily.  90 tablet  0  . metFORMIN (GLUCOPHAGE) 500 MG tablet       . metoprolol tartrate (LOPRESSOR) 25 MG tablet Take 25 mg by mouth 2 (two) times daily.      . simvastatin (  ZOCOR) 40 MG tablet Take 40 mg by mouth every evening.      Marland Kitchen NOVOFINE 32G X 6 MM MISC        No current facility-administered medications on file prior to visit.     REVIEW OF SYSTEMS: Please see history of present illness, otherwise no changes  PHYSICAL EXAMINATION:   Vital signs are BP 139/116  Pulse 70  Ht 5' 9.5" (1.765 m)  Wt 169 lb (76.658 kg)  BMI 24.61 kg/m2  SpO2 100% General: The patient appears their stated age. HEENT:  No gross abnormalities Pulmonary:  Non labored breathing Abdomen: Soft and non-tender Musculoskeletal: There are no major deformities. Neurologic: No focal weakness or paresthesias are detected, Skin: Necrotic right third toe ulcer with drainage Psychiatric: The patient has normal affect. Cardiovascular: There is a regular rate and rhythm without significant murmur appreciated. Pedal pulses are not palpable. He has bilateral palpable femoral pulses   Diagnostic Studies Outside ABIs are 1.0. With monophasic waveforms and a toe pressure of  35  Assessment: Right third toe ulcer Plan: This is a limb threatening situation. The patient has a nonhealing ulcer on his right third toe which I do not feel a salvageable. Because of his diabetes and history of peripheral vascular disease, I feel that he needs to undergo repeat angiography to further evaluate his lower extremity circulation. There is any lesion amenable to repair, I would proceed in that setting. I do feel that ultimately he will require toe amputation. This could be done by Dr. Tanda Rockers for myself. The patient does have transportation issues that may be better for Dr. Tanda Rockers to do this. I have scheduled his angiogram for this Wednesday, April 2  V. Charlena Cross, M.D. [facility] of [place] Office: [phone] Pager:  [phone]

## 2012-04-21 ENCOUNTER — Encounter (HOSPITAL_COMMUNITY): Payer: Self-pay | Admitting: Pharmacy Technician

## 2012-04-21 ENCOUNTER — Encounter: Payer: Self-pay | Admitting: Surgery

## 2012-04-24 ENCOUNTER — Ambulatory Visit (HOSPITAL_COMMUNITY): Payer: Self-pay | Admitting: Psychiatry

## 2012-04-28 ENCOUNTER — Other Ambulatory Visit: Payer: Self-pay | Admitting: *Deleted

## 2012-04-29 ENCOUNTER — Other Ambulatory Visit: Payer: Self-pay

## 2012-04-29 ENCOUNTER — Encounter (HOSPITAL_COMMUNITY)
Admission: RE | Disposition: A | Discharge status: Home / Self Care | Payer: BC Managed Care – PPO | Source: Ambulatory Visit | Attending: Surgery

## 2012-04-29 ENCOUNTER — Ambulatory Visit (HOSPITAL_COMMUNITY)
Admission: RE | Admit: 2012-04-29 | Discharge: 2012-04-29 | Disposition: A | Discharge status: Home / Self Care | Payer: BC Managed Care – PPO | Source: Ambulatory Visit | Attending: Surgery | Admitting: Surgery

## 2012-04-29 ENCOUNTER — Ambulatory Visit: Payer: Self-pay | Admitting: Neurosurgery

## 2012-04-29 ENCOUNTER — Telehealth: Payer: Self-pay | Admitting: Surgery

## 2012-04-29 DIAGNOSIS — I739 Peripheral vascular disease, unspecified: Secondary | ICD-10-CM | POA: Insufficient documentation

## 2012-04-29 DIAGNOSIS — E119 Type 2 diabetes mellitus without complications: Secondary | ICD-10-CM | POA: Insufficient documentation

## 2012-04-29 DIAGNOSIS — Z79899 Other long term (current) drug therapy: Secondary | ICD-10-CM | POA: Insufficient documentation

## 2012-04-29 DIAGNOSIS — L97509 Non-pressure chronic ulcer of other part of unspecified foot with unspecified severity: Secondary | ICD-10-CM | POA: Insufficient documentation

## 2012-04-29 DIAGNOSIS — L98499 Non-pressure chronic ulcer of skin of other sites with unspecified severity: Secondary | ICD-10-CM

## 2012-04-29 DIAGNOSIS — Z794 Long term (current) use of insulin: Secondary | ICD-10-CM | POA: Insufficient documentation

## 2012-04-29 DIAGNOSIS — I10 Essential (primary) hypertension: Secondary | ICD-10-CM | POA: Insufficient documentation

## 2012-04-29 DIAGNOSIS — F329 Major depressive disorder, single episode, unspecified: Secondary | ICD-10-CM | POA: Insufficient documentation

## 2012-04-29 DIAGNOSIS — S88119A Complete traumatic amputation at level between knee and ankle, unspecified lower leg, initial encounter: Secondary | ICD-10-CM | POA: Insufficient documentation

## 2012-04-29 DIAGNOSIS — F3289 Other specified depressive episodes: Secondary | ICD-10-CM | POA: Insufficient documentation

## 2012-04-29 DIAGNOSIS — Z8673 Personal history of transient ischemic attack (TIA), and cerebral infarction without residual deficits: Secondary | ICD-10-CM | POA: Insufficient documentation

## 2012-04-29 DIAGNOSIS — Z7902 Long term (current) use of antithrombotics/antiplatelets: Secondary | ICD-10-CM | POA: Insufficient documentation

## 2012-04-29 HISTORY — PX: ABDOMINAL AORTAGRAM: SHX5454

## 2012-04-29 LAB — POCT I-STAT, CHEM 8
BUN: 15 mg/dL (ref 6–23)
Creatinine, Ser: 1.1 mg/dL (ref 0.50–1.35)
Sodium: 140 mEq/L (ref 135–145)
TCO2: 27 mmol/L (ref 0–100)

## 2012-04-29 SURGERY — ABDOMINAL AORTAGRAM
Anesthesia: LOCAL

## 2012-04-29 MED ORDER — ALUM & MAG HYDROXIDE-SIMETH 200-200-20 MG/5ML PO SUSP: 15.0000 mL | ORAL | Status: DC | PRN

## 2012-04-29 MED ORDER — ACETAMINOPHEN 325 MG RE SUPP: 325.0000 mg | RECTAL | Status: DC | PRN

## 2012-04-29 MED ORDER — SODIUM CHLORIDE 0.9 % IV SOLN
INTRAVENOUS | Status: DC
  Administered 2012-04-29: 08:00:00 via INTRAVENOUS

## 2012-04-29 MED ORDER — MORPHINE SULFATE 10 MG/ML IJ SOLN: 2.0000 mg | INTRAMUSCULAR | Status: DC | PRN

## 2012-04-29 MED ORDER — HYDRALAZINE HCL 20 MG/ML IJ SOLN: 10.0000 mg | INTRAMUSCULAR | Status: DC | PRN

## 2012-04-29 MED ORDER — ACETAMINOPHEN 325 MG PO TABS: 325.0000 mg | ORAL_TABLET | ORAL | Status: DC | PRN

## 2012-04-29 MED ORDER — SODIUM CHLORIDE 0.9 % IV SOLN: INTRAVENOUS | Status: DC

## 2012-04-29 MED ORDER — LABETALOL HCL 5 MG/ML IV SOLN: 10.0000 mg | INTRAVENOUS | Status: DC | PRN

## 2012-04-29 MED ORDER — MIDAZOLAM HCL 2 MG/2ML IJ SOLN
INTRAMUSCULAR | Status: AC
  Filled 2012-04-29: qty 2

## 2012-04-29 MED ORDER — FENTANYL CITRATE 0.05 MG/ML IJ SOLN
INTRAMUSCULAR | Status: AC
  Filled 2012-04-29: qty 2

## 2012-04-29 MED ORDER — ONDANSETRON HCL 4 MG/2ML IJ SOLN: 4.0000 mg | Freq: Four times a day (QID) | INTRAMUSCULAR | Status: DC | PRN

## 2012-04-29 MED ORDER — METOPROLOL TARTRATE 1 MG/ML IV SOLN: 2.0000 mg | INTRAVENOUS | Status: DC | PRN

## 2012-04-29 MED ORDER — LIDOCAINE HCL (PF) 1 % IJ SOLN
INTRAMUSCULAR | Status: AC
  Filled 2012-04-29: qty 30

## 2012-04-29 MED ORDER — PHENOL 1.4 % MT LIQD: 1.0000 | OROMUCOSAL | Status: DC | PRN

## 2012-04-29 MED ORDER — OXYCODONE-ACETAMINOPHEN 5-325 MG PO TABS: 1.0000 | ORAL_TABLET | ORAL | Status: DC | PRN

## 2012-04-29 MED ORDER — GUAIFENESIN-DM 100-10 MG/5ML PO SYRP: 15.0000 mL | ORAL_SOLUTION | ORAL | Status: DC | PRN

## 2012-04-29 NOTE — Op Note (Signed)
Vascular and Vein Specialists of [facility]  Patient name: Chad Grant MRN: [id number] DOB: [birth year] Sex: male  04/29/2012 Pre-operative Diagnosis: Right toe ulcer Post-operative diagnosis:  Same Surgeon:  Jorge Ny Procedure Performed:  1.  ultrasound-guided access, left femoral artery  2.  abdominal aortogram  3.  right lower extremity runoff  4.  second order catheterization   Indications:  The patient has a nonhealing right third toe ulcer. There is concern for vascular insufficiency. He comes in today for arteriogram and possible intervention  Procedure:  The patient was identified in the holding area and taken to room 8.  The patient was then placed supine on the table and prepped and draped in the usual sterile fashion.  A time out was called.  Ultrasound was used to evaluate the left common femoral artery.  It was patent .  A digital ultrasound image was acquired.  A micropuncture needle was used to access the left common femoral artery under ultrasound guidance.  An 018 wire was advanced without resistance and a micropuncture sheath was placed.  The 018 wire was removed and a benson wire was placed.  The micropuncture sheath was exchanged for a 5 french sheath.  An omniflush catheter was advanced over the wire to the level of L-1.  An abdominal angiogram was obtained.  Next, using the omniflush catheter and a benson wire, the aortic bifurcation was crossed and the catheter was placed into theright external iliac artery and right runoff was obtained.    Findings:   Aortogram:  The visualized portions of the suprarenal abdominal aorta showed no significant stenosis. There are single renal arteries which are widely patent. The infrarenal abdominal aorta is widely patent. Bilateral common, external, and internal iliac arteries are all widely patent.  Right Lower Extremity:  The right common femoral artery is widely patent as is the right profunda and superficial femoral artery.  The right popliteal artery is patent throughout it's course without significant stenosis. There is single vessel runoff via the peroneal artery which collateralizes to a very diminutive posterior tibial and dorsalis pedis at the ankle. Minimal flow is visualized out in the digital arteries. The posterior tibial and anterior tibial arteries are occluded at their origin   Intervention:  None  Impression:  #1  no significant aortoiliac occlusive disease.  #2  no significant superficial femoral-popliteal artery stenosis.  #3  single vessel runoff via the peroneal artery which collateralizes at the ankle to a very diminutive pedal vessels.  #4  no percutaneous revascularization indicated      V. Durene Cal, M.D. [facility] of [place] Office: [phone] Pager:  [phone]

## 2012-04-29 NOTE — Telephone Encounter (Signed)
Spoke with wife, gave appt info, sent letter - kf

## 2012-04-29 NOTE — Interval H&P Note (Signed)
History and Physical Interval Note:  04/29/2012 8:50 AM  Chad Grant  has presented today for surgery, with the diagnosis of non healing ulcer/right foot  The various methods of treatment have been discussed with the patient and family. After consideration of risks, benefits and other options for treatment, the patient has consented to  Procedure(s): ABDOMINAL AORTAGRAM (N/A) as a surgical intervention .  The patient's history has been reviewed, patient examined, no change in status, stable for surgery.  I have reviewed the patient's chart and labs.  Questions were answered to the patient's satisfaction.     BRABHAM IV, V. WELLS

## 2012-04-29 NOTE — H&P (View-Only) (Signed)
Vascular and Vein Specialist of [place]   Patient name: Chad Grant MRN: [id number] DOB: 06/24/1950 Sex: male     Chief Complaint  Patient presents with  . Re-evaluation    non healing ulcer R foot - Dr. Nichols est pt last OV 04/16/2010    HISTORY OF PRESENT ILLNESS: The patient is here for evaluation of a right third toe ulcer. I have seen him in the past for a right great toe ulcer. This went on to require amputation. He did undergo angiography which revealed no significant aortoiliac occlusive disease no significant outflow disease, and single-vessel runoff via the peroneal artery which reconstitutes a dorsalis pedis. He was able to heal 8 great toe amputation. He states that he bumped his right third toe under donor about 2 weeks ago. He has been seen at the wound center. He has developed necrotic tissue at the wound. He is sent for vascular evaluation. He is previously undergone a left leg amputation at [facility]  The patient suffers from type 2 diabetes. This is managed with oral medication. He is on Zocor for hypercholesterolemia.  Past Medical History  Diagnosis Date  . Diabetes mellitus type II   . Hypertension   . Stroke   . Depression   . S/P BKA (below knee amputation) unilateral     Past Surgical History  Procedure Laterality Date  . Left leg amputa    . Left leg amputated    . Shoulder surgery    . Cataract removed      History   Social History  . Marital Status: Married    Spouse Name: N/A    Number of Children: N/A  . Years of Education: N/A   Occupational History  . Not on file.   Social History Main Topics  . Smoking status: Never Smoker   . Smokeless tobacco: Never Used  . Alcohol Use: No  . Drug Use: No  . Sexually Active: Not on file   Other Topics Concern  . Not on file   Social History Narrative  . No narrative on file    Family History  Problem Relation Age of Onset  . Alcohol abuse Brother   . Anxiety disorder Neg Hx   .  Bipolar disorder Neg Hx   . Dementia Neg Hx   . Depression Neg Hx   . Drug abuse Neg Hx   . Paranoid behavior Neg Hx   . Schizophrenia Neg Hx   . OCD Neg Hx   . Seizures Neg Hx   . Sexual abuse Neg Hx   . Physical abuse Neg Hx   . ADD / ADHD Neg Hx     Allergies as of 04/20/2012  . (No Known Allergies)    Current Outpatient Prescriptions on File Prior to Visit  Medication Sig Dispense Refill  . buPROPion (WELLBUTRIN SR) 150 MG 12 hr tablet Take 1 tablet (150 mg total) by mouth daily.  90 tablet  0  . clopidogrel (PLAVIX) 75 MG tablet Take 75 mg by mouth daily.      . insulin glargine (LANTUS) 100 UNIT/ML injection Inject into the skin at bedtime.      . lamoTRIgine (LAMICTAL) 150 MG tablet Take 1 tablet (150 mg total) by mouth daily.  90 tablet  0  . metFORMIN (GLUCOPHAGE) 500 MG tablet       . metoprolol tartrate (LOPRESSOR) 25 MG tablet Take 25 mg by mouth 2 (two) times daily.      . simvastatin (  ZOCOR) 40 MG tablet Take 40 mg by mouth every evening.      . NOVOFINE 32G X 6 MM MISC        No current facility-administered medications on file prior to visit.     REVIEW OF SYSTEMS: Please see history of present illness, otherwise no changes  PHYSICAL EXAMINATION:   Vital signs are BP 139/116  Pulse 70  Ht 5' 9.5" (1.765 m)  Wt 169 lb (76.658 kg)  BMI 24.61 kg/m2  SpO2 100% General: The patient appears their stated age. HEENT:  No gross abnormalities Pulmonary:  Non labored breathing Abdomen: Soft and non-tender Musculoskeletal: There are no major deformities. Neurologic: No focal weakness or paresthesias are detected, Skin: Necrotic right third toe ulcer with drainage Psychiatric: The patient has normal affect. Cardiovascular: There is a regular rate and rhythm without significant murmur appreciated. Pedal pulses are not palpable. He has bilateral palpable femoral pulses   Diagnostic Studies Outside ABIs are 1.0. With monophasic waveforms and a toe pressure of  35  Assessment: Right third toe ulcer Plan: This is a limb threatening situation. The patient has a nonhealing ulcer on his right third toe which I do not feel a salvageable. Because of his diabetes and history of peripheral vascular disease, I feel that he needs to undergo repeat angiography to further evaluate his lower extremity circulation. There is any lesion amenable to repair, I would proceed in that setting. I do feel that ultimately he will require toe amputation. This could be done by Dr. Nichols for myself. The patient does have transportation issues that may be better for Dr. Nichols to do this. I have scheduled his angiogram for this Wednesday, April 2  V. Wells Sher Shampine IV, M.D. [facility] of [place] Office: [phone] Pager:  [phone]   

## 2012-04-29 NOTE — Telephone Encounter (Signed)
Message copied by Margaretmary Eddy on Wed Apr 29, 2012 11:27 AM ------      Message from: Melene Plan      Created: Wed Apr 29, 2012  9:59 AM                   ----- Message -----         From: Nada Libman, MD         Sent: 04/29/2012   9:57 AM           To: Reuel Derby, Melene Plan, RN            04/29/2012:            Procedure Performed:       1.  ultrasound-guided access, left femoral artery       2.  abdominal aortogram       3.  right lower extremity runoff       4.  second order catheterization                  Schedule patient for followup in one month. No vascular studies needed ------

## 2012-05-11 ENCOUNTER — Encounter (HOSPITAL_COMMUNITY): Payer: Self-pay | Admitting: Psychiatry

## 2012-05-11 ENCOUNTER — Encounter (HOSPITAL_COMMUNITY): Payer: Self-pay | Admitting: *Deleted

## 2012-05-11 ENCOUNTER — Ambulatory Visit (INDEPENDENT_AMBULATORY_CARE_PROVIDER_SITE_OTHER): Payer: BC Managed Care – PPO | Admitting: Psychiatry

## 2012-05-11 VITALS — Wt 169.2 lb

## 2012-05-11 DIAGNOSIS — F329 Major depressive disorder, single episode, unspecified: Secondary | ICD-10-CM

## 2012-05-11 MED ORDER — CARBAMAZEPINE ER 100 MG PO TB12: ORAL_TABLET | ORAL | Status: DC

## 2012-05-11 NOTE — Patient Instructions (Signed)
Take only 1/2 Lamictal for 3 days then stop.  After that start the Tegretol.  Stop the Wellbutrin today.  Set a timer for 8 or a certain number minutes and walk for that amount of time in the house or in the yard.  Mark the number of minutes on a calendar for that day.  Do that every day this week.  Then next week increase the time by 1 minutes and then mark the calendar with the number of minutes for that day.  Each week increase your exercise by one minute.  Keep a record of this so you can see the progress you are making.  Do this every day, just like eating and sleeping.  It is good for pain control, depression, and for your soul/spirit.  Bring the record in for your next visit so we can talk about your effort and how you feel with the new exercise program going and working for you.  Take care of yourself.  No one else is standing up to do the job and only you know what you need.   GET SERIOUS about taking care of yourself.  Do the next right thing and that often means doing something to care for yourself along the lines of are you hungry, are you angry, are you lonely, are you tired, are you scared?  [employer] is what that stands for.  Call if problems or concerns.

## 2012-05-11 NOTE — Progress Notes (Signed)
[facility] [address] Progress Note Chad Grant MRN: [id number] DOB: 04-Jan-1951 Age: 62 y.o.  Date: 05/11/2012 Start Time: 2:20 PM End Time: 2:50 PM  Chief Complaint: Chief Complaint  Patient presents with  . Depression  . Follow-up  . Medication Refill   Subjective: "He's looking at a toe amputation and possibly leg amputation". Depression 8/10 and Anxiety 8/10, where 0 is none and 10 is the worst.  Pain is 0/10.   History of Present Illness The patient returns for follow-up appointment with his wife.  Pt reports that he is compliant with the psychotropic medications with poor benefit and no noticeable side effects.  His wife notes that he gets combative with a higher dose of Wellbutrin.   Reviewed with her the symptoms of Temporal Lobe Problems of emotional instability, memory problems, aggression, headaches, and learning problems.  He has at least 3 of these symptoms, since the stroke, specifically emotional instability, memory problems, and aggression.  Will try Tegretol for him.  Discussed why pt is behaving like he wants to die.  He states that he wants to get better.  He walks every other day.  I challenged him to walk every day.  Current psychiatric medication Lamictal 150 mg daily Wellbutrin SR 150 mg daily  Psychiatric history Patient has been seeing in this office since 2009.  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  Since then patient has been notice very depressed and isolated.  He denies any history of inpatient psychiatric treatment or any suicidal attempt.  In the past he had tried Depakote Celexa Xanax temazepam Cymbalta nortriptyline Ritalin however patient either due to side effects or had a poor response with these medication.  Family history family history includes Alcohol abuse in his brother.  There is no history of Anxiety disorder, and Bipolar disorder, and Dementia, and Depression, and Drug abuse, and Paranoid  behavior, and Schizophrenia, and OCD, and Seizures, and Sexual abuse, and Physical abuse, and ADD / ADHD, .  Medical history Patient has history of CVA with weakness on one side. Patient has diabetes, hypertension and hyperlipidemia. Patient sees Dr. Sherryll Burger in [place].  His last hemoglobin A1c is 8.5.  Mental status examination Patient is casually dressed and fairly groomed. He maintained fair eye contact. He described his mood better and his affect is improved from the past. His speech is slow but he has poverty of thought content.  He denies any paranoid thinking or any delusions. He denies any active or passive suicidal thinking and homicidal thinking. His attention and concentration is fair.  He denies any auditory or visual hallucination.  There were no delusion present.  He is difficult to engage in conversation.  He is alert and oriented x3. His insight judgment and impulse control is okay.  Lab Results:  Results for orders placed during the hospital encounter of 04/29/12 (from the past 2016 hour(s))  POCT I-STAT, CHEM 8   Collection Time    04/29/12  8:04 AM      Result Value Range   Sodium 140  135 - 145 mEq/L   Potassium 4.3  3.5 - 5.1 mEq/L   Chloride 103  96 - 112 mEq/L   BUN 15  6 - 23 mg/dL   Creatinine, Ser 1.91  0.50 - 1.35 mg/dL   Glucose, Bld 478 (*) 70 - 99 mg/dL   Calcium, Ion 2.95  6.21 - 1.30 mmol/L   TCO2 27  0 - 100 mmol/L  Hemoglobin 15.0  13.0 - 17.0 g/dL   HCT 16.1  09.6 - 04.5 %  GLUCOSE, CAPILLARY   Collection Time    04/29/12 10:13 AM      Result Value Range   Glucose-Capillary 103 (*) 70 - 99 mg/dL  Last WU9W was 8.5  Assessment Axis I Major depressive disorder Axis II deferred Axis III see medical history Axis IV moderate  Plan/Discussion: I took his vitals.  I reviewed CC, tobacco/med/surg Hx, meds effects/ side effects, problem list, therapies and responses as well as current situation/symptoms discussed options. Stop Lamictal and Wellbutrin as  ineffective and try Tegretol for what could be brain damage from stroke or slight trauma in face of very fragile blood vessels. See orders and pt instructions for more details.  MEDICATIONS this encounter: No orders of the defined types were placed in this encounter.    Medical Decision Making Problem Points:  Established problem, worsening (2), New problem, with no additional work-up planned (3), Review of last therapy session (1) and Review of psycho-social stressors (1) Data Points:  Review or order clinical lab tests (1) Review of new medications or change in dosage (2)  I certify that outpatient services furnished can reasonably be expected to improve the patient's condition.   Orson Aloe, MD, [facility]

## 2012-06-08 ENCOUNTER — Ambulatory Visit: Payer: Self-pay | Admitting: Surgery

## 2012-06-09 ENCOUNTER — Ambulatory Visit (HOSPITAL_COMMUNITY): Payer: Self-pay | Admitting: Psychiatry

## 2012-06-16 ENCOUNTER — Ambulatory Visit (HOSPITAL_COMMUNITY): Payer: Self-pay | Admitting: Psychiatry

## 2012-06-30 ENCOUNTER — Ambulatory Visit (HOSPITAL_COMMUNITY): Payer: Self-pay | Admitting: Psychiatry

## 2012-07-02 ENCOUNTER — Emergency Department (HOSPITAL_COMMUNITY)
Admission: EM | Admit: 2012-07-02 | Discharge: 2012-07-03 | Disposition: A | Discharge status: Other Acute Inpatient Hospital | Payer: BC Managed Care – PPO | Attending: Emergency Medicine | Admitting: Emergency Medicine

## 2012-07-02 ENCOUNTER — Encounter (HOSPITAL_COMMUNITY): Payer: Self-pay | Admitting: Psychiatry

## 2012-07-02 ENCOUNTER — Encounter (HOSPITAL_COMMUNITY): Payer: Self-pay | Admitting: Emergency Medicine

## 2012-07-02 ENCOUNTER — Ambulatory Visit (INDEPENDENT_AMBULATORY_CARE_PROVIDER_SITE_OTHER): Payer: BC Managed Care – PPO | Admitting: Psychiatry

## 2012-07-02 VITALS — BP 124/76 | Ht 69.5 in | Wt 160.6 lb

## 2012-07-02 DIAGNOSIS — F329 Major depressive disorder, single episode, unspecified: Secondary | ICD-10-CM

## 2012-07-02 DIAGNOSIS — Z79899 Other long term (current) drug therapy: Secondary | ICD-10-CM | POA: Insufficient documentation

## 2012-07-02 DIAGNOSIS — Z794 Long term (current) use of insulin: Secondary | ICD-10-CM | POA: Insufficient documentation

## 2012-07-02 DIAGNOSIS — E119 Type 2 diabetes mellitus without complications: Secondary | ICD-10-CM | POA: Insufficient documentation

## 2012-07-02 DIAGNOSIS — I10 Essential (primary) hypertension: Secondary | ICD-10-CM | POA: Insufficient documentation

## 2012-07-02 DIAGNOSIS — F332 Major depressive disorder, recurrent severe without psychotic features: Secondary | ICD-10-CM | POA: Insufficient documentation

## 2012-07-02 DIAGNOSIS — Z8673 Personal history of transient ischemic attack (TIA), and cerebral infarction without residual deficits: Secondary | ICD-10-CM | POA: Insufficient documentation

## 2012-07-02 DIAGNOSIS — S88119A Complete traumatic amputation at level between knee and ankle, unspecified lower leg, initial encounter: Secondary | ICD-10-CM | POA: Insufficient documentation

## 2012-07-02 LAB — COMPREHENSIVE METABOLIC PANEL
ALT: 28 U/L (ref 0–53)
AST: 18 U/L (ref 0–37)
Albumin: 4.1 g/dL (ref 3.5–5.2)
CO2: 23 mEq/L (ref 19–32)
Calcium: 9.7 mg/dL (ref 8.4–10.5)
GFR calc non Af Amer: >90 mL/min (ref >90)
Sodium: 135 mEq/L (ref 135–145)

## 2012-07-02 LAB — CBC
MCH: 29 pg (ref 26.0–34.0)
Platelets: 145 10*3/uL — ABNORMAL LOW (ref 150–400)
RBC: 5.04 MIL/uL (ref 4.22–5.81)
RDW: 13.1 % (ref 11.5–15.5)
WBC: 6.5 10*3/uL (ref 4.0–10.5)

## 2012-07-02 LAB — RAPID URINE DRUG SCREEN, HOSP PERFORMED
Amphetamines: NOT DETECTED
Barbiturates: NOT DETECTED
Benzodiazepines: NOT DETECTED
Cocaine: NOT DETECTED
Opiates: NOT DETECTED
Tetrahydrocannabinol: NOT DETECTED

## 2012-07-02 MED ORDER — ACETAMINOPHEN 325 MG PO TABS: 650.0000 mg | ORAL_TABLET | ORAL | Status: DC | PRN

## 2012-07-02 MED ORDER — IBUPROFEN 200 MG PO TABS
600.0000 mg | ORAL_TABLET | Freq: Three times a day (TID) | ORAL | Status: DC | PRN
  Administered 2012-07-02: 600 mg via ORAL
  Filled 2012-07-02: qty 3

## 2012-07-02 MED ORDER — SODIUM CHLORIDE 0.9 % IV SOLN
Freq: Once | INTRAVENOUS | Status: AC
  Administered 2012-07-02: 21:00:00 via INTRAVENOUS

## 2012-07-02 MED ORDER — ONDANSETRON HCL 4 MG PO TABS
4.0000 mg | ORAL_TABLET | Freq: Three times a day (TID) | ORAL | Status: DC | PRN
Start: 2012-07-02 — End: 2012-07-03

## 2012-07-02 MED ORDER — LORAZEPAM 1 MG PO TABS
1.0000 mg | ORAL_TABLET | Freq: Three times a day (TID) | ORAL | Status: DC | PRN
  Administered 2012-07-02: 1 mg via ORAL
  Filled 2012-07-02: qty 1

## 2012-07-02 MED ORDER — ZOLPIDEM TARTRATE 5 MG PO TABS: 5.0000 mg | ORAL_TABLET | Freq: Every evening | ORAL | Status: DC | PRN

## 2012-07-02 MED ORDER — INSULIN ASPART 100 UNIT/ML IV SOLN
10.0000 [IU] | Freq: Once | INTRAVENOUS | Status: AC
  Administered 2012-07-02: 10 [IU] via INTRAVENOUS
  Filled 2012-07-02: qty 0.1

## 2012-07-02 NOTE — ED Notes (Signed)
Pts wife states pt was sent here from Dr. Dan Humphreys from [facility] d/t severe depression. Pt denies SI/HI or auditory/visual hallucinations

## 2012-07-02 NOTE — Patient Instructions (Signed)
Go to [facility] ED and say that you are there are medical clearance for [facility] admission.  Call if problems or concerns.
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
## 2012-07-02 NOTE — ED Notes (Signed)
Pt refused peripheral IV, PA aware, will talk to pt.

## 2012-07-02 NOTE — Progress Notes (Signed)
[facility] [address] Progress Note Garald Rhew MRN: [id number] DOB: [birth year] Age: 62 y.o.  Date: 07/02/2012 Start Time: 2:45 PM End Time: 3:25 PM  Chief Complaint: Chief Complaint  Patient presents with  . Depression    Acts in a pretty complete way that he wants to be dead and nothing has changed.  . Follow-up  . Medication Refill   Subjective: "He's looking at a toe amputation and possibly leg amputation". Depression 8/10 and Anxiety 8/10, where 0 is none and 10 is the worst.  Pain is 0/10.   History of Present Illness The patient returns for follow-up appointment with his wife.  Pt reports that he is compliant with the psychotropic medications with poor benefit and no noticeable side effects.  His wife today states that she has given up loving him and feels like it is perhaps time ot do something different.  Pt offers statements that if he felt like it he could get out and do more.  I challenged him on what behavior has changed to convict him of actually wanting to live.  He gives no substantive answer. He reports getting hassled from his wife and if she wants to leave, then she should do just that for herself.  He says that he sill manage and that she could leave him by himself in the parking lot and he wold manage.  He seems to be quite stuck in a place of hostility and self loathing and is neglecting himself.  I ask if he wants to go in the hospital and he quickly answers yes to "satisfy her and you(meaning author)".  He has demonstrated no change or improvement in 5 months of outpatient care and nothing even suggesting a desire to help himself or change.  He describes his social situation as one of misery with his wife not wanting to do anything and just harping on him until he gets angry and then she retreats into her room and closes the door.  He has a most unhappy almost hostile look on his face and holds his body in the same way.  Will refer to inpatient care for better  medications adjustment.   Current psychiatric medication Tegretol XR 100 mg  Psychiatric history Patient has been seeing in this office since 2009.  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  Since then patient has been notice very depressed and isolated.  He denies any history of inpatient psychiatric treatment or any suicidal attempt.  In the past he had tried Depakote Celexa Xanax temazepam Cymbalta nortriptyline Ritalin however patient either due to side effects or had a poor response with these medication.  Vitals: BP 124/76  Ht 5' 9.5" (1.765 m)  Wt 160 lb 9.6 oz (72.848 kg)  BMI 23.38 kg/m2  Allergies: No Known Allergies Medical History: Past Medical History  Diagnosis Date  . Diabetes mellitus type II   . Hypertension   . Stroke   . Depression   . S/P BKA (below knee amputation) unilateral   Patient has history of CVA with weakness on one side. Patient has diabetes, hypertension and hyperlipidemia. Patient sees Dr. Sherryll Burger in [place].  His last hemoglobin A1c is 8.5.  Surgical History: Past Surgical History  Procedure Laterality Date  . Left leg amputa    . Left leg amputated    . Shoulder surgery    . Cataract removed     Family History: family history includes Alcohol abuse in his brother.  There is no history of Anxiety disorder, and Bipolar disorder, and Dementia, and Depression, and Drug abuse, and Paranoid behavior, and Schizophrenia, and OCD, and Seizures, and Sexual abuse, and Physical abuse, and ADD / ADHD, . Reviewed in office today and nothing has changed.  Mental status examination Patient is casually dressed and fairly groomed. He maintained fair eye contact. He described his mood better and his affect is improved from the past. His speech is slow but he has poverty of thought content.  He denies any paranoid thinking or any delusions. He denies any active or passive suicidal thinking and homicidal thinking. His attention and  concentration is fair.  He denies any auditory or visual hallucination.  There were no delusion present.  He is difficult to engage in conversation.  He is alert and oriented x3. His insight judgment and impulse control is okay.  Lab Results:  Results for orders placed during the hospital encounter of 04/29/12 (from the past 2016 hour(s))  POCT I-STAT, CHEM 8   Collection Time    04/29/12  8:04 AM      Result Value Range   Sodium 140  135 - 145 mEq/L   Potassium 4.3  3.5 - 5.1 mEq/L   Chloride 103  96 - 112 mEq/L   BUN 15  6 - 23 mg/dL   Creatinine, Ser 1.61  0.50 - 1.35 mg/dL   Glucose, Bld 096 (*) 70 - 99 mg/dL   Calcium, Ion 0.45  4.09 - 1.30 mmol/L   TCO2 27  0 - 100 mmol/L   Hemoglobin 15.0  13.0 - 17.0 g/dL   HCT 81.1  91.4 - 78.2 %  GLUCOSE, CAPILLARY   Collection Time    04/29/12 10:13 AM      Result Value Range   Glucose-Capillary 103 (*) 70 - 99 mg/dL  Last NF6O was 8.5  Assessment Axis I Major depressive disorder Axis II deferred Axis III see medical history Axis IV moderate  Plan/Discussion: I took his vitals.  I reviewed CC, tobacco/med/surg Hx, meds effects/ side effects, problem list, therapies and responses as well as current situation/symptoms discussed options. Go to the [facility] ED for medical clearance for admission to [facility].  Behavior that speaks loudly of wanting to be dead and nothing has changed in the last 5 months of attending out patient care. See orders and pt instructions for more details.  MEDICATIONS this encounter: No orders of the defined types were placed in this encounter.    Medical Decision Making Problem Points:  Established problem, worsening (2), Review of last therapy session (1) and Review of psycho-social stressors (1) Data Points:  Review or order clinical lab tests (1) Referral to inpatient care (1)  I certify that outpatient services furnished can reasonably be expected to improve the patient's condition.   Orson Aloe,  MD, [facility]

## 2012-07-02 NOTE — ED Provider Notes (Signed)
History  This chart was scribed for non-physician practitioner Arthor Captain, PA-C working with Richardean Canal, MD, by Candelaria Stagers, ED Scribe. This patient was seen in room [facility]TR3/[facility]PT3 and the patient's care was started at 5:15 PM   CSN: [id number]  Arrival date & time 07/02/12  1623   First MD Initiated Contact with Patient 07/02/12 1701      Chief Complaint  Patient presents with  . Medical Clearance   The history is provided by the patient. No language interpreter was used.   HPI Comments: Chad Grant is a 62 y.o. male who presents to the Emergency Department sent here from PCP, Dr. Dan Humphreys, experiencing severe depression associated with failing health c/o diabetes and HTN.  Pt states he does not want to take shots and is experiencing anxiety associated with the shots.  He denies SI/HI or visual/auditory visualization.  He has no other associated symptoms.     Past Medical History  Diagnosis Date  . Diabetes mellitus type II   . Hypertension   . Stroke   . Depression   . S/P BKA (below knee amputation) unilateral     Past Surgical History  Procedure Laterality Date  . Left leg amputa    . Left leg amputated    . Shoulder surgery    . Cataract removed      Family History  Problem Relation Age of Onset  . Alcohol abuse Brother   . Anxiety disorder Neg Hx   . Bipolar disorder Neg Hx   . Dementia Neg Hx   . Depression Neg Hx   . Drug abuse Neg Hx   . Paranoid behavior Neg Hx   . Schizophrenia Neg Hx   . OCD Neg Hx   . Seizures Neg Hx   . Sexual abuse Neg Hx   . Physical abuse Neg Hx   . ADD / ADHD Neg Hx     History  Substance Use Topics  . Smoking status: Never Smoker   . Smokeless tobacco: Never Used  . Alcohol Use: No      Review of Systems  Psychiatric/Behavioral: The patient is nervous/anxious.        Depression  All other systems reviewed and are negative.    Allergies  Review of patient's allergies indicates no known allergies.  Home  Medications   Current Outpatient Rx  Name  Route  Sig  Dispense  Refill  . carbamazepine (TEGRETOL-XR) 100 MG 12 hr tablet      Take by mouth 1 at bed for the first night or 2, then 2 at night for 2 nights, then 3 at night. Then AFTER 5 days of stable dose get labs   90 tablet   1   . clopidogrel (PLAVIX) 75 MG tablet   Oral   Take 75 mg by mouth daily.         Marland Kitchen JANUVIA 100 MG tablet   Oral   Take 1 tablet by mouth daily.         . metFORMIN (GLUCOPHAGE) 500 MG tablet   Oral   Take 500 mg by mouth 2 (two) times daily with a meal.          . metoprolol tartrate (LOPRESSOR) 25 MG tablet   Oral   Take 25 mg by mouth 2 (two) times daily.         . simvastatin (ZOCOR) 40 MG tablet   Oral   Take 40 mg by mouth every evening.         Marland Kitchen  Acetaminophen (TYLENOL EXTRA STRENGTH PO)   Oral   Take 1 tablet by mouth as needed.         . insulin glargine (LANTUS) 100 UNIT/ML injection   Subcutaneous   Inject 20-25 Units into the skin at bedtime. 20 units normally, if sugar above 180 will take additonal.         . NOVOFINE 32G X 6 MM MISC                 BP 146/94  Pulse 100  Temp(Src) 98.3 F (36.8 C) (Oral)  Resp 20  SpO2 97%  Physical Exam  Nursing note and vitals reviewed. Constitutional: He is oriented to person, place, and time. He appears well-developed and well-nourished. No distress.  HENT:  Head: Normocephalic and atraumatic.  Eyes: EOM are normal.  Neck: Neck supple. No tracheal deviation present.  Cardiovascular: Normal rate.   Pulmonary/Chest: Effort normal. No respiratory distress.  Musculoskeletal: Normal range of motion.  Left BKA.   Neurological: He is alert and oriented to person, place, and time.  Skin: Skin is warm and dry.  Psychiatric: He has a normal mood and affect. His behavior is normal.    ED Course  Procedures   DIAGNOSTIC STUDIES: Oxygen Saturation is 97% on room air, normal by my interpretation.    COORDINATION OF  CARE:  5:23 PM Discussed course of care with pt who understands and agrees.   Labs Reviewed  CBC  COMPREHENSIVE METABOLIC PANEL  ETHANOL  URINE RAPID DRUG SCREEN (HOSP PERFORMED)   No results found.   No diagnosis found.    MDM  6:46 PM Patient with Hyperglycemia. Will administer subcue insulin. No gap. No signs of DKA Telepsych ordered    9:35 PM I have spoken with Patient's family. They describe anhedonia.  Patient appears competent to make his own decisions. I have spoken with the Psych team here at [facility] who will evaluate. GLucose is down to 267. Patient seen in shared visit with Dr. Silverio Lay. I have given report PA Laisure who will assume care.  I personally performed the services described in this documentation, which was scribed in my presence. The recorded information has been reviewed and is accurate.          Arthor Captain, PA-C 07/02/12 2137

## 2012-07-02 NOTE — ED Provider Notes (Signed)
Medical screening examination/treatment/procedure(s) were conducted as a shared visit with non-physician practitioner(s) and myself.  I personally evaluated the patient during the encounter  Chad Grant is a 62 y.o. male hx of DM, depression here with worsening depression and medication uncompliance. As per family, he hasn't been taking his meds. He also felt depressed but not actively suicidal. Sent by his psychiatrist for evaluation. Labs showed glucose 300s with nl AG. After fluids, CBG dec to 267. Will get telepsych consult. He is competent to refuse meds as he understand risks of not taking his meds.    Richardean Canal, MD 07/02/12 [phone]

## 2012-07-03 ENCOUNTER — Inpatient Hospital Stay (HOSPITAL_COMMUNITY)
Admission: AD | Admit: 2012-07-03 | Discharge: 2012-07-06 | DRG: 430 | Disposition: A | Discharge status: Home / Self Care | Payer: BC Managed Care – PPO | Source: Intra-hospital | Attending: Psychiatry | Admitting: Psychiatry

## 2012-07-03 ENCOUNTER — Encounter (HOSPITAL_COMMUNITY): Payer: Self-pay | Admitting: *Deleted

## 2012-07-03 ENCOUNTER — Encounter: Payer: Self-pay | Admitting: Surgery

## 2012-07-03 ENCOUNTER — Telehealth (HOSPITAL_COMMUNITY): Payer: Self-pay | Admitting: Psychiatry

## 2012-07-03 DIAGNOSIS — E119 Type 2 diabetes mellitus without complications: Secondary | ICD-10-CM | POA: Diagnosis present

## 2012-07-03 DIAGNOSIS — Z91199 Patient's noncompliance with other medical treatment and regimen due to unspecified reason: Secondary | ICD-10-CM

## 2012-07-03 DIAGNOSIS — E1151 Type 2 diabetes mellitus with diabetic peripheral angiopathy without gangrene: Secondary | ICD-10-CM

## 2012-07-03 DIAGNOSIS — F322 Major depressive disorder, single episode, severe without psychotic features: Secondary | ICD-10-CM

## 2012-07-03 DIAGNOSIS — I10 Essential (primary) hypertension: Secondary | ICD-10-CM

## 2012-07-03 DIAGNOSIS — Z79899 Other long term (current) drug therapy: Secondary | ICD-10-CM

## 2012-07-03 DIAGNOSIS — S88119A Complete traumatic amputation at level between knee and ankle, unspecified lower leg, initial encounter: Secondary | ICD-10-CM

## 2012-07-03 DIAGNOSIS — Z9119 Patient's noncompliance with other medical treatment and regimen: Secondary | ICD-10-CM

## 2012-07-03 DIAGNOSIS — F332 Major depressive disorder, recurrent severe without psychotic features: Principal | ICD-10-CM

## 2012-07-03 DIAGNOSIS — Z8673 Personal history of transient ischemic attack (TIA), and cerebral infarction without residual deficits: Secondary | ICD-10-CM

## 2012-07-03 DIAGNOSIS — L98499 Non-pressure chronic ulcer of skin of other sites with unspecified severity: Secondary | ICD-10-CM

## 2012-07-03 DIAGNOSIS — F329 Major depressive disorder, single episode, unspecified: Secondary | ICD-10-CM

## 2012-07-03 LAB — GLUCOSE, CAPILLARY
Glucose-Capillary: 167 mg/dL — ABNORMAL HIGH (ref 70–99)
Glucose-Capillary: 234 mg/dL — ABNORMAL HIGH (ref 70–99)
Glucose-Capillary: 310 mg/dL — ABNORMAL HIGH (ref 70–99)

## 2012-07-03 MED ORDER — INSULIN GLARGINE 100 UNIT/ML ~~LOC~~ SOLN: 20.0000 [IU] | Freq: Every day | SUBCUTANEOUS | Status: DC

## 2012-07-03 MED ORDER — ZOLPIDEM TARTRATE 5 MG PO TABS: 5.0000 mg | ORAL_TABLET | Freq: Every evening | ORAL | Status: DC | PRN

## 2012-07-03 MED ORDER — MAGNESIUM HYDROXIDE 400 MG/5ML PO SUSP: 30.0000 mL | Freq: Every day | ORAL | Status: DC | PRN

## 2012-07-03 MED ORDER — CLOPIDOGREL BISULFATE 75 MG PO TABS
75.0000 mg | ORAL_TABLET | Freq: Every day | ORAL | Status: DC
  Administered 2012-07-03: 75 mg via ORAL
  Filled 2012-07-03: qty 1

## 2012-07-03 MED ORDER — METFORMIN HCL 500 MG PO TABS
500.0000 mg | ORAL_TABLET | Freq: Two times a day (BID) | ORAL | Status: DC
  Filled 2012-07-03 (×2): qty 1

## 2012-07-03 MED ORDER — METOPROLOL TARTRATE 25 MG PO TABS
25.0000 mg | ORAL_TABLET | Freq: Two times a day (BID) | ORAL | Status: DC
  Administered 2012-07-03: 25 mg via ORAL
  Filled 2012-07-03: qty 1

## 2012-07-03 MED ORDER — SIMVASTATIN 40 MG PO TABS
40.0000 mg | ORAL_TABLET | Freq: Every evening | ORAL | Status: DC
  Filled 2012-07-03: qty 1

## 2012-07-03 MED ORDER — METOPROLOL TARTRATE 25 MG PO TABS
25.0000 mg | ORAL_TABLET | Freq: Two times a day (BID) | ORAL | Status: DC
  Administered 2012-07-04 – 2012-07-06 (×6): 25 mg via ORAL
  Filled 2012-07-03 (×12): qty 1

## 2012-07-03 MED ORDER — LINAGLIPTIN 5 MG PO TABS
5.0000 mg | ORAL_TABLET | Freq: Every day | ORAL | Status: DC
  Administered 2012-07-04 – 2012-07-06 (×3): 5 mg via ORAL
  Filled 2012-07-03 (×5): qty 1

## 2012-07-03 MED ORDER — INSULIN GLARGINE 100 UNIT/ML ~~LOC~~ SOLN
20.0000 [IU] | Freq: Every day | SUBCUTANEOUS | Status: DC
  Filled 2012-07-03: qty 0.25

## 2012-07-03 MED ORDER — INSULIN ASPART 100 UNIT/ML ~~LOC~~ SOLN
0.0000 [IU] | Freq: Three times a day (TID) | SUBCUTANEOUS | Status: DC
  Administered 2012-07-04 (×2): 7 [IU] via SUBCUTANEOUS
  Administered 2012-07-04 – 2012-07-05 (×2): 11 [IU] via SUBCUTANEOUS
  Administered 2012-07-05 (×2): 4 [IU] via SUBCUTANEOUS
  Administered 2012-07-06: 7 [IU] via SUBCUTANEOUS
  Administered 2012-07-06: 3 [IU] via SUBCUTANEOUS

## 2012-07-03 MED ORDER — SIMVASTATIN 40 MG PO TABS
40.0000 mg | ORAL_TABLET | Freq: Every evening | ORAL | Status: DC
  Administered 2012-07-03 – 2012-07-06 (×4): 40 mg via ORAL
  Filled 2012-07-03 (×6): qty 1

## 2012-07-03 MED ORDER — ACETAMINOPHEN 325 MG PO TABS: 650.0000 mg | ORAL_TABLET | Freq: Four times a day (QID) | ORAL | Status: DC | PRN

## 2012-07-03 MED ORDER — INSULIN GLARGINE 100 UNIT/ML ~~LOC~~ SOLN
25.0000 [IU] | Freq: Every day | SUBCUTANEOUS | Status: DC
  Administered 2012-07-03 – 2012-07-05 (×3): 25 [IU] via SUBCUTANEOUS

## 2012-07-03 MED ORDER — LINAGLIPTIN 5 MG PO TABS
5.0000 mg | ORAL_TABLET | Freq: Every day | ORAL | Status: DC
  Administered 2012-07-03: 5 mg via ORAL
  Filled 2012-07-03: qty 1

## 2012-07-03 MED ORDER — ALUM & MAG HYDROXIDE-SIMETH 200-200-20 MG/5ML PO SUSP: 30.0000 mL | ORAL | Status: DC | PRN

## 2012-07-03 MED ORDER — METFORMIN HCL 500 MG PO TABS
500.0000 mg | ORAL_TABLET | Freq: Two times a day (BID) | ORAL | Status: DC
  Administered 2012-07-04 – 2012-07-06 (×6): 500 mg via ORAL
  Filled 2012-07-03 (×9): qty 1

## 2012-07-03 MED ORDER — CARBAMAZEPINE ER 100 MG PO TB12
100.0000 mg | ORAL_TABLET | Freq: Two times a day (BID) | ORAL | Status: DC
  Administered 2012-07-03: 100 mg via ORAL
  Filled 2012-07-03: qty 1

## 2012-07-03 MED ORDER — CLOPIDOGREL BISULFATE 75 MG PO TABS
75.0000 mg | ORAL_TABLET | Freq: Every day | ORAL | Status: DC
  Administered 2012-07-04 – 2012-07-06 (×3): 75 mg via ORAL
  Filled 2012-07-03 (×5): qty 1

## 2012-07-03 MED ORDER — CARBAMAZEPINE ER 100 MG PO TB12
100.0000 mg | ORAL_TABLET | Freq: Two times a day (BID) | ORAL | Status: DC
  Administered 2012-07-04 – 2012-07-06 (×6): 100 mg via ORAL
  Filled 2012-07-03 (×9): qty 1

## 2012-07-03 MED ORDER — ONDANSETRON HCL 4 MG PO TABS: 4.0000 mg | ORAL_TABLET | Freq: Three times a day (TID) | ORAL | Status: DC | PRN

## 2012-07-03 NOTE — BH Assessment (Signed)
Assessment Note   Chad Grant is an 62 y.o. male. Who presents to the ED with worsening depression. Patient denies SI/AH/VH. Patient report HI due to depression. Patient reports severe depression of insomnia, feelings of hopelessness, anhedonia, feelings of worthlessness, increase anger and irritability, guilt, and fatigue. Patient family reports that patient has been refusing medication and insulin and wants to be left alone. Patient reports he has had thoughts of hurting other because he wants to be left alone. Pt is very tearful during assessment. Pt reports history of depression. Pt recent stressors are medical issues including Status post below knee amputation. Patient was evaluated by psychiatrist and recommended inpatient due to patient refusing care and withdrawing from care and caregivers.   Axis I: Major Depression, Recurrent severe Axis II: Deferred Axis III:  Past Medical History  Diagnosis Date  . Diabetes mellitus type II   . Hypertension   . Stroke   . Depression   . S/P BKA (below knee amputation) unilateral    Axis IV: other psychosocial or environmental problems, problems related to social environment and problems with primary support group Axis V: 11-20 some danger of hurting self or others possible OR occasionally fails to maintain minimal personal hygiene OR gross impairment in communication  Past Medical History:  Past Medical History  Diagnosis Date  . Diabetes mellitus type II   . Hypertension   . Stroke   . Depression   . S/P BKA (below knee amputation) unilateral     Past Surgical History  Procedure Laterality Date  . Left leg amputa    . Left leg amputated    . Shoulder surgery    . Cataract removed      Family History:  Family History  Problem Relation Age of Onset  . Alcohol abuse Brother   . Anxiety disorder Neg Hx   . Bipolar disorder Neg Hx   . Dementia Neg Hx   . Depression Neg Hx   . Drug abuse Neg Hx   . Paranoid behavior Neg Hx   .  Schizophrenia Neg Hx   . OCD Neg Hx   . Seizures Neg Hx   . Sexual abuse Neg Hx   . Physical abuse Neg Hx   . ADD / ADHD Neg Hx     Social History:  reports that he has never smoked. He has never used smokeless tobacco. He reports that he does not drink alcohol or use illicit drugs.  Additional Social History:     CIWA: CIWA-Ar BP: 145/79 mmHg Pulse Rate: 73 COWS:    Allergies: No Known Allergies  Home Medications:  (Not in a hospital admission)  OB/GYN Status:  No LMP for male patient.  General Assessment Data Location of Assessment: [facility] Living Arrangements: Spouse/significant other Can pt return to current living arrangement?: Yes Admission Status: Voluntary Is patient capable of signing voluntary admission?: Yes Transfer from: Home Referral Source: Self/Family/Friend  Education Status Is patient currently in school?: No  Risk to self Suicidal Ideation: No Suicidal Intent: No Is patient at risk for suicide?: No Suicidal Plan?: No Access to Means: No What has been your use of drugs/alcohol within the last 12 months?: n/a Previous Attempts/Gestures: No How many times?: 0 Other Self Harm Risks: no Triggers for Past Attempts: Unpredictable Intentional Self Injurious Behavior: None Family Suicide History: No Recent stressful life event(s): Other (Comment) Persecutory voices/beliefs?: No Depression: Yes Depression Symptoms: Despondent;Insomnia;Tearfulness;Isolating;Fatigue;Guilt;Loss of interest in usual pleasures;Feeling worthless/self pity;Feeling angry/irritable Substance abuse history and/or treatment  for substance abuse?: No  Risk to Others Homicidal Ideation: Yes-Currently Present Thoughts of Harm to Others: Yes-Currently Present Comment - Thoughts of Harm to Others: people who wont leave things alone Current Homicidal Intent: No Current Homicidal Plan: No Access to Homicidal Means: No Identified Victim: n/a History of harm to others?: No Assessment  of Violence: None Noted Violent Behavior Description: no Does patient have access to weapons?: No Criminal Charges Pending?: No Does patient have a court date: No  Psychosis Hallucinations: None noted Delusions: None noted  Mental Status Report Appear/Hygiene: Disheveled Eye Contact: Fair Motor Activity: Freedom of movement Speech: Logical/coherent Level of Consciousness: Alert Mood: Depressed Affect: Depressed Anxiety Level: Minimal Thought Processes: Coherent;Relevant Judgement: Unimpaired Orientation: Person;Place;Time;Situation Obsessive Compulsive Thoughts/Behaviors: None  Cognitive Functioning Concentration: Normal Memory: Recent Intact;Remote Intact IQ: Average Insight: Fair Impulse Control: Fair Appetite: Good Sleep: Decreased Total Hours of Sleep: 2 Vegetative Symptoms: None  ADLScreening [facility] Assessment Services) Patient's cognitive ability adequate to safely complete daily activities?: Yes Patient able to express need for assistance with ADLs?: Yes Independently performs ADLs?: Yes (appropriate for developmental age)  Abuse/Neglect [facility]) Physical Abuse: Denies Verbal Abuse: Denies Sexual Abuse: Denies  Prior Inpatient Therapy Prior Inpatient Therapy: No  Prior Outpatient Therapy Prior Outpatient Therapy: Yes Prior Therapy Dates: ongoing Prior Therapy Facilty/Provider(s): Dr. Dan Humphreys Reason for Treatment: Depression  ADL Screening (condition at time of admission) Patient's cognitive ability adequate to safely complete daily activities?: Yes Patient able to express need for assistance with ADLs?: Yes Independently performs ADLs?: Yes (appropriate for developmental age)  Home Assistive Devices/Equipment Home Assistive Devices/Equipment: Medical laboratory scientific officer (specify quad or straight)    Abuse/Neglect Assessment (Assessment to be complete while patient is alone) Physical Abuse: Denies Verbal Abuse: Denies Sexual Abuse: Denies Values / Beliefs Cultural  Requests During Hospitalization: None Spiritual Requests During Hospitalization: None        Additional Information 1:1 In Past 12 Months?: No CIRT Risk: No Elopement Risk: No Does patient have medical clearance?: Yes     Disposition:  Disposition Initial Assessment Completed for this Encounter: Yes Disposition of Patient: Inpatient treatment program Type of inpatient treatment program: Adult  On Site Evaluation by:   Reviewed with Physician:     Catha Gosselin A 07/03/2012 3:39 PM

## 2012-07-03 NOTE — BH Assessment (Signed)
[facility] Assessment Progress Note      Dr Lolly Mustache has accepted for admission to [facility] from [facility] this patient, he has a bed available after 3pm today, [phone]

## 2012-07-03 NOTE — Consult Note (Signed)
Reason for Consult:  Severe Depression Referring Physician: Dr Leeann Must  Chad Grant is an 62 y.o. male.  HPI: AA male 62 years old was brought in by family members yesterday for refusing to take his medications especially his insulin.  This am patient was seen and interviewed with this Clinical research associate and Dr Lolly Mustache who happens to know him very well as a patient.  His primary care Physician encouraged his family to bring him in for evaluation of depression.  Patient acknowledges that he is depressed but states he has refused to take his insulin because he is scared of the needles.  When asked about his not taking his other medications he replied he is having memory problems lately.  Patient and family reports he spends most of his time at home sleeping.  He reports anhedonia and has lost interest in any pleasures or doing things he used to to enjoy.  He stated he want his family to leave him alone and not to worry about him.  He reports feeling hopeless and helpless and towards the end of the interview he said" I may not be here longer"  When asked to explain his comment further he refused.  He denied SI/HI/AVH.  He exhibited sad mood and his affect is flat.  Past Medical History  Diagnosis Date  . Diabetes mellitus type II   . Hypertension   . Stroke   . Depression   . S/P BKA (below knee amputation) unilateral     Past Surgical History  Procedure Laterality Date  . Left leg amputa    . Left leg amputated    . Shoulder surgery    . Cataract removed      Family History  Problem Relation Age of Onset  . Alcohol abuse Brother   . Anxiety disorder Neg Hx   . Bipolar disorder Neg Hx   . Dementia Neg Hx   . Depression Neg Hx   . Drug abuse Neg Hx   . Paranoid behavior Neg Hx   . Schizophrenia Neg Hx   . OCD Neg Hx   . Seizures Neg Hx   . Sexual abuse Neg Hx   . Physical abuse Neg Hx   . ADD / ADHD Neg Hx     Social History:  reports that he has never smoked. He has never used smokeless  tobacco. He reports that he does not drink alcohol or use illicit drugs.  Allergies: No Known Allergies  Medications: I have reviewed the patient's current medications.  Results for orders placed during the hospital encounter of 07/02/12 (from the past 48 hour(s))  CBC     Status: Abnormal   Collection Time    07/02/12  5:25 PM      Result Value Range   WBC 6.5  4.0 - 10.5 K/uL   RBC 5.04  4.22 - 5.81 MIL/uL   Hemoglobin 14.6  13.0 - 17.0 g/dL   HCT 16.1  09.6 - 04.5 %   MCV 82.5  78.0 - 100.0 fL   MCH 29.0  26.0 - 34.0 pg   MCHC 35.1  30.0 - 36.0 g/dL   RDW 40.9  81.1 - 91.4 %   Platelets 145 (*) 150 - 400 K/uL  COMPREHENSIVE METABOLIC PANEL     Status: Abnormal   Collection Time    07/02/12  5:25 PM      Result Value Range   Sodium 135  135 - 145 mEq/L   Potassium 4.3  3.5 -  5.1 mEq/L   Chloride 97  96 - 112 mEq/L   CO2 23  19 - 32 mEq/L   Glucose, Bld 348 (*) 70 - 99 mg/dL   BUN 16  6 - 23 mg/dL   Creatinine, Ser 1.61  0.50 - 1.35 mg/dL   Calcium 9.7  8.4 - 09.6 mg/dL   Total Protein 8.1  6.0 - 8.3 g/dL   Albumin 4.1  3.5 - 5.2 g/dL   AST 18  0 - 37 U/L   ALT 28  0 - 53 U/L   Alkaline Phosphatase 86  39 - 117 U/L   Total Bilirubin 0.3  0.3 - 1.2 mg/dL   GFR calc non Af Amer >90  >90 mL/min   GFR calc Af Amer >90  >90 mL/min   Comment:            The eGFR has been calculated     using the CKD EPI equation.     This calculation has not been     validated in all clinical     situations.     eGFR's persistently     <90 mL/min signify     possible Chronic Kidney Disease.  ETHANOL     Status: None   Collection Time    07/02/12  5:25 PM      Result Value Range   Alcohol, Ethyl (B) <11  0 - 11 mg/dL   Comment:            LOWEST DETECTABLE LIMIT FOR     SERUM ALCOHOL IS 11 mg/dL     FOR MEDICAL PURPOSES ONLY  URINE RAPID DRUG SCREEN (HOSP PERFORMED)     Status: None   Collection Time    07/02/12  6:14 PM      Result Value Range   Opiates NONE DETECTED  NONE  DETECTED   Cocaine NONE DETECTED  NONE DETECTED   Benzodiazepines NONE DETECTED  NONE DETECTED   Amphetamines NONE DETECTED  NONE DETECTED   Tetrahydrocannabinol NONE DETECTED  NONE DETECTED   Barbiturates NONE DETECTED  NONE DETECTED   Comment:            DRUG SCREEN FOR MEDICAL PURPOSES     ONLY.  IF CONFIRMATION IS NEEDED     FOR ANY PURPOSE, NOTIFY LAB     WITHIN 5 DAYS.                LOWEST DETECTABLE LIMITS     FOR URINE DRUG SCREEN     Drug Class       Cutoff (ng/mL)     Amphetamine      1000     Barbiturate      200     Benzodiazepine   200     Tricyclics       300     Opiates          300     Cocaine          300     THC              50  GLUCOSE, CAPILLARY     Status: Abnormal   Collection Time    07/02/12  8:56 PM      Result Value Range   Glucose-Capillary 267 (*) 70 - 99 mg/dL  GLUCOSE, CAPILLARY     Status: Abnormal   Collection Time    07/03/12 12:28 AM      Result Value  Range   Glucose-Capillary 167 (*) 70 - 99 mg/dL    No results found.  Review of Systems  Constitutional: Negative.   HENT: Negative.   Eyes: Negative.   Respiratory: Negative.   Gastrointestinal: Negative.        Some loss of appetite  Genitourinary: Negative.   Musculoskeletal: Negative.   Skin: Negative.   Neurological: Negative.   Endo/Heme/Allergies: Negative.   Psychiatric/Behavioral: Positive for depression (States he is depressed and may not be here longer.  WHEN PRESSED FOR EXPLANATION HE DID NOT EXPLAIN), suicidal ideas (He denies si but is refusing his medications.) and memory loss (Reports recent memory loss where he does not remember if he took his medications or not.). Negative for hallucinations and substance abuse. The patient is nervous/anxious (Reports anxiety associated with taking insulin injection.). The patient does not have insomnia.    Blood pressure 145/79, pulse 73, temperature 98.4 F (36.9 C), temperature source Oral, resp. rate 18, SpO2 98.00%. Physical  Exam  Constitutional: He is oriented to person, place, and time. He appears well-developed and well-nourished.  HENT:  Head: Normocephalic and atraumatic.  Eyes: Conjunctivae and EOM are normal. Left eye exhibits no discharge.  Neck: Normal range of motion. Neck supple.  Cardiovascular: Normal rate, regular rhythm and normal heart sounds.   Respiratory: Effort normal and breath sounds normal. No respiratory distress. He has no wheezes. He has no rales. He exhibits no tenderness.  GI: Soft. Bowel sounds are normal. He exhibits no distension and no mass. There is no tenderness. There is no rebound and no guarding.  Musculoskeletal: Normal range of motion. He exhibits no edema and no tenderness.  Hx of left BKA  Neurological: He is alert and oriented to person, place, and time.  Skin: Skin is warm and dry.    Assessment/Plan:  Consult with Dr Lolly Mustache and face to face interview. Recommendation/Plan:  We will admit to our inpatient Psychiatric unit (500 hall) for treatment of severe depression.  Dahlia Byes, C  PMHNP-BC 07/03/2012, 1:50 PM     I personally seen the patient agreed with the findings and involved in the treatment plan.

## 2012-07-03 NOTE — ED Notes (Signed)
Security called to transport pt to [facility] with chart and personal belongings, condition stable at time of DC.

## 2012-07-03 NOTE — Progress Notes (Addendum)
Nursing admit note- This is a first psychiatric admission for this 62y/o m/b/m admitted following medical clearance with chief c/o increasing depression and intermitttent thoughts of suicide.  Vitali states his physical health issues are stressors and he "hates the shot" in referance to insulin.  Family reports that he is refusing medications at home.  He is an outpatient client at [facility].  Medical hx of IDDM,HTN,CVA and BKA.  Presents superficially bright but confirming depression and is motivated for treatment.  CBG at 319.  Diabetic diet teaching done.  Denies current SI and contracts for safety.  Oriented to unit.  POC  And 15' initiated.  Safety maintained.

## 2012-07-03 NOTE — ED Notes (Signed)
Troy from [facility] called back and informed this nurse that Administrative Director wanted to make sure that pt was appropriate, to call back in about 15 minutes. Diane, Consulting civil engineer also aware, will monitor.

## 2012-07-03 NOTE — ED Provider Notes (Signed)
SW/ACT indicates pt accepted to [facility], Dr Arfeen/Jonolagadda, bed ready.   Suzi Roots, MD 07/03/12 [phone]

## 2012-07-03 NOTE — ED Notes (Signed)
Pt to be DC to [facility], Dr. Denton Lank aware.

## 2012-07-03 NOTE — Progress Notes (Signed)
CSW completed support paperwork, and faxed. CSW confirmed with Fulton Mole that patient can be sent to [facility]. Nurse calling report.   Catha Gosselin, LCSWA  [phone] 07/03/2012 1613pm

## 2012-07-03 NOTE — ED Notes (Signed)
Dr. Jeraldine Loots aware of CBG and that home medications were not ordered, will monitor.

## 2012-07-03 NOTE — Progress Notes (Signed)
[facility] Group Notes:  (Nursing/MHT/Case Management/Adjunct)  Date:  07/03/2012  Time:  10:32 PM  Type of Therapy:  Group Therapy  Participation Level:  Did Not Attend  Participation Quality:  Did Not Attend  Affect:  Did Not Attend  Cognitive:  Did Not Attend  Insight:  None  Engagement in Group:  Did Not Attend  Modes of Intervention:  Activity  Summary of Progress/Problems: Pt. New admit. Pt. Was resting in bed.  Sondra Come 07/03/2012, 10:32 PM

## 2012-07-03 NOTE — ED Notes (Addendum)
Pt denies SI and states he has thought about hurting his wife but no one else

## 2012-07-03 NOTE — ED Notes (Signed)
Act team was contacted on status of pt.Waitng for assessment

## 2012-07-03 NOTE — ED Notes (Signed)
Chad Grant, CSW/act team at bedside, will monitor.

## 2012-07-03 NOTE — Tx Team (Signed)
Initial Interdisciplinary Treatment Plan  PATIENT STRENGTHS: (choose at least two) Ability for insight Active sense of humor Average or above average intelligence General fund of knowledge Motivation for treatment/growth  PATIENT STRESSORS: Health problems Medication change or noncompliance   PROBLEM LIST: Problem List/Patient Goals Date to be addressed Date deferred Reason deferred Estimated date of resolution  SI r/t heath condition 07-03-12   D/c        Medication non-compliance 07-03-12   D/c        AKA- mobility challenges 07-03-12   D/c                           DISCHARGE CRITERIA:  Ability to meet basic life and health needs Improved stabilization in mood, thinking, and/or behavior Motivation to continue treatment in a less acute level of care Reduction of life-threatening or endangering symptoms to within safe limits Safe-care adequate arrangements made Verbal commitment to aftercare and medication compliance  PRELIMINARY DISCHARGE PLAN: Attend aftercare/continuing care group Participate in family therapy Return to previous living arrangement  PATIENT/FAMIILY INVOLVEMENT: This treatment plan has been presented to and reviewed with the patient, Chad Grant, and/or family member,.  The patient and family have been given the opportunity to ask questions and make suggestions.  Cresenciano Lick 07/03/2012, 6:45 PM

## 2012-07-03 NOTE — ED Notes (Signed)
Baxter Hire, CSW at bedside, awaiting bed placement per her, will monitor. Pt and pt's family informed.

## 2012-07-04 DIAGNOSIS — F332 Major depressive disorder, recurrent severe without psychotic features: Principal | ICD-10-CM

## 2012-07-04 LAB — GLUCOSE, CAPILLARY: Glucose-Capillary: 225 mg/dL — ABNORMAL HIGH (ref 70–99)

## 2012-07-04 MED ORDER — SERTRALINE HCL 25 MG PO TABS
25.0000 mg | ORAL_TABLET | Freq: Every day | ORAL | Status: DC
  Administered 2012-07-04 – 2012-07-06 (×3): 25 mg via ORAL
  Filled 2012-07-04 (×5): qty 1

## 2012-07-04 NOTE — Progress Notes (Signed)
.  Psychoeducational Group Note    Date: 07/04/2012 Time:  0915    Goal Setting Purpose of Group: To be able to set a goal that is measurable and that can be accomplished in one day Participation Level:  Active  Participation Quality:  Appropriate  Affect:  Appropriate  Cognitive:  Oriented  Insight:  Improving  Engagement in Group:  Engaged  Additional Comments:    Dione Housekeeper

## 2012-07-04 NOTE — [facility] Suicide Risk Assessment (Signed)
Suicide Risk Assessment  Admission Assessment     Nursing information obtained from:  Patient Demographic factors:  Male Current Mental Status:  Self-harm thoughts Loss Factors:  Decline in physical health Historical Factors:  Family history of mental illness or substance abuse Risk Reduction Factors:  Sense of responsibility to family;Religious beliefs about death  CLINICAL FACTORS:   Depression:   Anhedonia Severe Medical Diagnoses and Treatments/Surgeries  COGNITIVE FEATURES THAT CONTRIBUTE TO RISK:  Memory Impairment secodary to stroke  SUICIDE RISK:   Minimal: No identifiable suicidal ideation.  Patients presenting with no risk factors but with morbid ruminations; may be classified as minimal risk based on the severity of the depressive symptoms  PLAN OF CARE: Treatment Plan Summary: Daily contact with patient to assess and evaluate symptoms and progress in treatment Medication management   Current Medications:  Current Facility-Administered Medications  Medication Dose Route Frequency Provider Last Rate Last Dose  . acetaminophen (TYLENOL) tablet 650 mg  650 mg Oral Q6H PRN Earney Navy, NP      . alum & mag hydroxide-simeth (MAALOX/MYLANTA) 200-200-20 MG/5ML suspension 30 mL  30 mL Oral Q4H PRN Earney Navy, NP      . carbamazepine (TEGRETOL XR) 12 hr tablet 100 mg  100 mg Oral BID Earney Navy, NP   100 mg at 07/04/12 0837  . clopidogrel (PLAVIX) tablet 75 mg  75 mg Oral Daily Earney Navy, NP   75 mg at 07/04/12 0833  . insulin aspart (novoLOG) injection 0-20 Units  0-20 Units Subcutaneous TID WC Court Joy, PA-C   7 Units at 07/04/12 [phone]  . insulin glargine (LANTUS) injection 25 Units  25 Units Subcutaneous QHS Court Joy, PA-C   25 Units at 07/03/12 2142  . linagliptin (TRADJENTA) tablet 5 mg  5 mg Oral Daily Earney Navy, NP   5 mg at 07/04/12 6962  . magnesium hydroxide (MILK OF MAGNESIA) suspension 30 mL  30 mL Oral Daily  PRN Earney Navy, NP      . metFORMIN (GLUCOPHAGE) tablet 500 mg  500 mg Oral BID WC Earney Navy, NP   500 mg at 07/04/12 0832  . metoprolol tartrate (LOPRESSOR) tablet 25 mg  25 mg Oral BID Earney Navy, NP   25 mg at 07/04/12 0833  . ondansetron (ZOFRAN) tablet 4 mg  4 mg Oral Q8H PRN Earney Navy, NP      . simvastatin (ZOCOR) tablet 40 mg  40 mg Oral QPM Earney Navy, NP   40 mg at 07/03/12 1811  . zolpidem (AMBIEN) tablet 5 mg  5 mg Oral QHS PRN Earney Navy, NP        Observation Level/Precautions:  15 minute checks  Laboratory:  Reviewed  Psychotherapy:  Patient will participate in groups.  Medications:  Will start Zoloft 25 mg. Will continue tegretol. Will consider restarting Wellbutrin tomorrow  Consultations:  None  Discharge Concerns:  Medication compliance, ongoing memory problem  Estimated LOS: 5-7 days  Other:  None    I certify that inpatient services furnished can reasonably be expected to improve the patient's condition.  Markel Kurtenbach 07/04/2012, 9:03 AM

## 2012-07-04 NOTE — H&P (Signed)
Psychiatric Admission Assessment Adult  Patient Identification:  Chad Grant Date of Evaluation:  07/04/2012 Chief Complaint:  MAJOR DEPRESSIVE DISORDER  History of Present Illness:: Chad Grant is a 62 y/o male with a past psychiatric history significant for Major Depressive Disorder . He was admitted after he spoke with his PCP and told him he no longer wanted to take his insulin shot due to pain and some worsening of his depression.  He also reported some diffculty with memory which including problems remembering to take his medications including forgetting to take Lamictal and Wellbutrin (possibly for weeks).  He has been started on tegretol by his outpatient provider. Wellbutrin had been discontinued as his wife reported an increase in combativeness with this medication. He reports that his sleep patterns have changed to sleeping mostly in the mornings.   Elements:  Location:  On inpatient unit. Quality:  Progressive worsening depression, with symptoms including anhedonia. sad mood, affect is flat. Severity:  causing significant distress and incapacitating.. Timing:  Daily and constant. Duration:  5 months. Context:  Has been present since 2009 but worsened since his stroke in 2009..  Associated Signs/Synptoms: Depression Symptoms:  depressed mood, anhedonia, hopelessness, impaired memory, (Hypo) Manic Symptoms:  Patient denies. Anxiety Symptoms: Patient denies. Psychotic Symptoms: Patient denies. PTSD Symptoms: Negative  Psychiatric Specialty Exam: Physical Exam  Nursing note and vitals reviewed. Constitutional: He appears well-developed and well-nourished. No distress.  Skin: He is not diaphoretic.  I reviewed the physical exam performed by Chad Captain, PA-C on 07/02/2012 at 9:37 PM  An agree with the findings  Review of Systems  Constitutional: Positive for malaise/fatigue. Negative for fever, chills and weight loss.  Respiratory: Negative for cough, hemoptysis, sputum  production, shortness of breath and wheezing.   Cardiovascular: Negative for chest pain, palpitations, leg swelling and PND.  Gastrointestinal: Negative for heartburn, nausea, vomiting, abdominal pain, diarrhea and constipation.  Genitourinary: Negative for dysuria and urgency.  Neurological: Positive for sensory change, focal weakness and weakness. Negative for dizziness, tingling, tremors, speech change and loss of consciousness.    Blood pressure 123/72, pulse 71, temperature 97.8 F (36.6 C), temperature source Oral, resp. rate 16, height 5\' 9"  (1.753 m), weight 73.936 kg (163 lb), SpO2 98.00%.Body mass index is 24.06 kg/(m^2).  General Appearance: Casual and Fairly Groomed  Patent attorney::  Good  Speech:  Clear and Coherent and Normal Rate  Volume:  Normal  Mood:  Depressed  Affect:  Appropriate, Congruent and Full Range  Thought Process:  Coherent, Linear and Logical  Orientation:  Full (Time, Place, and Person)  Thought Content:  WDL  Suicidal Thoughts: Able to contract for safety on the unit  Homicidal Thoughts:  No Able to contract for safety on the unit  Memory:  Immediate;   Good Recent;   Poor Remote;   Fair  Judgement:  Fair  Insight:  Fair  Psychomotor Activity:  Normal  Concentration:  Fair  Recall:  Fair able to do 3 numbers backwards  Akathisia:  Yes  Handed:  Right  AIMS (if indicated):   Not indicated  Assets:  Communication Skills Desire for Improvement Financial Resources/Insurance Housing Intimacy Leisure Time Resilience Social Support Talents/Skills Transportation  Sleep:  Number of Hours: 4.5    Past Psychiatric History: Diagnosis: Major Depressive Disorder  Hospitalizations: Patient reports this is his first psychiatric hospitalization  Outpatient Care:Patient is seen at [facility] since [facility]  Substance Abuse Care: Patient denies  Self-Mutilation:Patient denies  Suicidal Attempts: Patient denies.  Violent Behaviors:  Patient denies.   Past Medical  History:   Past Medical History  Diagnosis Date  . Diabetes mellitus type II   . Hypertension   . Stroke   . Depression   . S/P BKA (below knee amputation) unilateral    Cardiac History:  History of stoke  Allergies:  No Known Allergies PTA Medications: Prescriptions prior to admission  Medication Sig Dispense Refill  . Acetaminophen (TYLENOL EXTRA STRENGTH PO) Take 1 tablet by mouth as needed.      . carbamazepine (TEGRETOL-XR) 100 MG 12 hr tablet Take by mouth 1 at bed for the first night or 2, then 2 at night for 2 nights, then 3 at night. Then AFTER 5 days of stable dose get labs  90 tablet  1  . clopidogrel (PLAVIX) 75 MG tablet Take 75 mg by mouth daily.      . insulin glargine (LANTUS) 100 UNIT/ML injection Inject 20-25 Units into the skin at bedtime. 20 units normally, if sugar above 180 will take additonal.      . JANUVIA 100 MG tablet Take 1 tablet by mouth daily.      . metFORMIN (GLUCOPHAGE) 500 MG tablet Take 500 mg by mouth 2 (two) times daily with a meal.       . metoprolol tartrate (LOPRESSOR) 25 MG tablet Take 25 mg by mouth 2 (two) times daily.      Marland Kitchen NOVOFINE 32G X 6 MM MISC       . simvastatin (ZOCOR) 40 MG tablet Take 40 mg by mouth every evening.        Previous Psychotropic Medications:  Medication  Lamictal  Wellbutrin  Depakote  Celexa  Cymbalta  Nortrytiline  Ritalin, temazepam,     Substance Abuse History in the last 12 months:  no  Consequences of Substance Abuse: Negative  Social History:  reports that he has never smoked. He has never used smokeless tobacco. He reports that he does not drink alcohol or use illicit drugs. Additional Social History: Current Place of Residence:  [place], [place] Place of Birth:  [place], [place] Family Members: Patient lives at home with his wife.  Marital Status:  Married Children:2  Sons:1  Daughters:1 Relationships: Patient reports his children, particularly his daughter and his wife are his main source of  support. Education:  HS Graduate Educational Problems/Performance: Patient denies Religious Beliefs/Practices: Psychologist, prison and probation services and goes to church History of Abuse (Emotional/Phsycial/Sexual): Patient denies. Occupational Experiences: Retired from [employer] and [employer] History:  None. Legal History: No Hobbies/Interests: Plays basketball.  Family History:   Family History  Problem Relation Age of Onset  . Alcohol abuse Brother   . Anxiety disorder Neg Hx   . Bipolar disorder Neg Hx   . Dementia Neg Hx   . Depression Neg Hx   . Drug abuse Neg Hx   . Paranoid behavior Neg Hx   . Schizophrenia Neg Hx   . OCD Neg Hx   . Seizures Neg Hx   . Sexual abuse Neg Hx   . Physical abuse Neg Hx   . ADD / ADHD Neg Hx     Results for orders placed during the hospital encounter of 07/03/12 (from the past 72 hour(s))  GLUCOSE, CAPILLARY     Status: Abnormal   Collection Time    07/03/12  9:06 PM      Result Value Range   Glucose-Capillary 234 (*) 70 - 99 mg/dL   Comment 1 Notify RN     Comment  2 Documented in Chart    GLUCOSE, CAPILLARY     Status: Abnormal   Collection Time    07/04/12  6:38 AM      Result Value Range   Glucose-Capillary 218 (*) 70 - 99 mg/dL   Psychological Evaluations:  Assessment:   AXIS I:  Major Depression, Recurrent severe AXIS II:  No diagnosis AXIS III:   Past Medical History  Diagnosis Date  . Diabetes mellitus type II   . Hypertension   . Stroke   . Depression   . S/P BKA (below knee amputation) unilateral    AXIS IV:  other psychosocial or environmental problems AXIS V:  21-30 behavior considerably influenced by delusions or hallucinations OR serious impairment in judgment, communication OR inability to function in almost all areas   Treatment Plan/Recommendations:  As noted below  Treatment Plan Summary: Daily contact with patient to assess and evaluate symptoms and progress in treatment Medication management   Current Medications:   Current Facility-Administered Medications  Medication Dose Route Frequency Provider Last Rate Last Dose  . acetaminophen (TYLENOL) tablet 650 mg  650 mg Oral Q6H PRN Earney Navy, NP      . alum & mag hydroxide-simeth (MAALOX/MYLANTA) 200-200-20 MG/5ML suspension 30 mL  30 mL Oral Q4H PRN Earney Navy, NP      . carbamazepine (TEGRETOL XR) 12 hr tablet 100 mg  100 mg Oral BID Earney Navy, NP   100 mg at 07/04/12 0837  . clopidogrel (PLAVIX) tablet 75 mg  75 mg Oral Daily Earney Navy, NP   75 mg at 07/04/12 0833  . insulin aspart (novoLOG) injection 0-20 Units  0-20 Units Subcutaneous TID WC Court Joy, PA-C   7 Units at 07/04/12 [phone]  . insulin glargine (LANTUS) injection 25 Units  25 Units Subcutaneous QHS Court Joy, PA-C   25 Units at 07/03/12 2142  . linagliptin (TRADJENTA) tablet 5 mg  5 mg Oral Daily Earney Navy, NP   5 mg at 07/04/12 1191  . magnesium hydroxide (MILK OF MAGNESIA) suspension 30 mL  30 mL Oral Daily PRN Earney Navy, NP      . metFORMIN (GLUCOPHAGE) tablet 500 mg  500 mg Oral BID WC Earney Navy, NP   500 mg at 07/04/12 0832  . metoprolol tartrate (LOPRESSOR) tablet 25 mg  25 mg Oral BID Earney Navy, NP   25 mg at 07/04/12 0833  . ondansetron (ZOFRAN) tablet 4 mg  4 mg Oral Q8H PRN Earney Navy, NP      . simvastatin (ZOCOR) tablet 40 mg  40 mg Oral QPM Earney Navy, NP   40 mg at 07/03/12 1811  . zolpidem (AMBIEN) tablet 5 mg  5 mg Oral QHS PRN Earney Navy, NP        Observation Level/Precautions:  15 minute checks  Laboratory:  Reviewed  Psychotherapy:  Patient will participate in groups.  Medications:  Will start Zoloft 25 mg. Will continue tegretol.  Will continue Zolpidem while in the hospital for sleep.  Consultations:  None  Discharge Concerns:  Medication compliance, ongoing memory problem  Estimated LOS: 5-7 days  Other:  None   I certify that inpatient services furnished can  reasonably be expected to improve the patient's condition.   Jamilla Galli 6/14/20149:03 AM

## 2012-07-04 NOTE — [facility] Group Notes (Signed)
[facility] LCSW Group Therapy  Stages of Change 07/04/2012  1:42 PM    Type of Therapy:  Group Therapy  Participation Level:  Active  Participation Quality:  Appropriate  Affect:  Appropriate  Cognitive:  Appropriate  Insight:  Developing/Improving and Engaged  Engagement in Therapy:  Developing/Improving and Engaged  Modes of Intervention:  Discussion, Education, Exploration, Problem-Solving, Rapport Building, Support  Summary of Progress/Problems: The topic for today was the stages of changes.  Patient were asked to identify changes they need to make and their motivation and commitment to making changes in their lives.  Patient shared he has to work to follow through on commitments he makes.  He shared he has to able to keep his word.    Wynn Banker 07/04/2012  1:42 PM

## 2012-07-04 NOTE — Progress Notes (Signed)
D: Patient resting in bed. Respirations even and unlabored. No apparent distress noted.  A: Monitoring pt Q15 min for safety  R: Patient remains safe on the unit. Will continue to monitor.  
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
## 2012-07-04 NOTE — Progress Notes (Signed)
D) Pt came to this writer this morning and states "I am all confused. I am not sure what I am doing here". Stated "I have a memory problem. I might know you today and forget you tomorrow". Attending the groups and interacting with his peers. Has gone to bed to rest frequently today. A) Given support and reassurance. Redirected as needed. Praised for attending the groups. Frequent contact with Pt throughout the day. Gentle approach used with Pt. R) Denies SI and Hi. Rates his depression and hopelessness both at an 8.

## 2012-07-04 NOTE — Progress Notes (Signed)
Psychoeducational Group Note  Date: 07/04/2012 Time:  1015  Group Topic/Focus:  Identifying Needs:   The focus of this group is to help patients identify their personal needs that have been historically problematic and identify healthy behaviors to address their needs.  Participation Level:  Active  Participation Quality:  Appropriate  Affect:  Appropriate  Cognitive:  Appropriate  Insight:  Improving  Engagement in Group:  Improving  Additional Comments:  Pt participated in group.   Havyn Ramo A  

## 2012-07-05 LAB — GLUCOSE, CAPILLARY
Glucose-Capillary: 209 mg/dL — ABNORMAL HIGH (ref 70–99)
Glucose-Capillary: 186 mg/dL — ABNORMAL HIGH (ref 70–99)

## 2012-07-05 NOTE — Progress Notes (Signed)
[facility] MD Progress Note  07/05/2012 2:01 PM Chad Grant  MRN:  [id number] Subjective:  Chad Grant is a 62 y/o male with a past psychiatric history significant for Major Depressive Disorder.  The patient reports that he has diarrhea last night. Staff reports that he had been missing the toilet and had been soiling in the bathroom.  He asked today if he could leave the hospital for partial hospitalization and come back todays.   Diagnosis:  Axis I: Major Depression, Recurrent severe  ADL's:  Impaired  Sleep: Poor  Appetite:  Fair  Suicidal Ideation:  Plan:  Patient denies Intent:  Patient denies. Means:  Patient denies. Homicidal Ideation:  Plan:  Patient denies. Intent:  Patient denies. Means:  Patient denies. AEB (as evidenced by): Behavior on unit   Psychiatric Specialty Exam: Review of Systems  Constitutional: Negative for fever, chills and weight loss.  Cardiovascular: Negative for chest pain, palpitations and leg swelling.  Gastrointestinal: Positive for diarrhea. Negative for heartburn, nausea, vomiting, abdominal pain and constipation.    Blood pressure 123/72, pulse 71, temperature 97.8 F (36.6 C), temperature source Oral, resp. rate 16, height 5\' 9"  (1.753 m), weight 73.936 kg (163 lb), SpO2 98.00%.Body mass index is 24.06 kg/(m^2).  General Appearance: Casual  Eye Contact::  Fair  Speech:  Clear and Coherent and Normal Rate  Volume:  Normal  Mood:  Depressed  Affect:  Appropriate and Congruent  Thought Process:  Coherent and Goal Directed  Orientation:  Full (Time, Place, and Person)  Thought Content:  WDL  Suicidal Thoughts:  No  Homicidal Thoughts:  No  Memory:  Immediate;   Good Recent;   Poor Remote;   Poor  Judgement:  Impaired  Insight:  Lacking  Psychomotor Activity:  Normal  Concentration:  Fair  Recall:  Poor  Akathisia:  No  Handed:  Right  AIMS (if indicated):   Not indicated  Assets:  Communication Skills Desire for Improvement Financial  Resources/Insurance Housing Social Support  Sleep:  Number of Hours: 4.5   Current Medications: Current Facility-Administered Medications  Medication Dose Route Frequency Provider Last Rate Last Dose  . acetaminophen (TYLENOL) tablet 650 mg  650 mg Oral Q6H PRN Earney Navy, NP      . alum & mag hydroxide-simeth (MAALOX/MYLANTA) 200-200-20 MG/5ML suspension 30 mL  30 mL Oral Q4H PRN Earney Navy, NP      . carbamazepine (TEGRETOL XR) 12 hr tablet 100 mg  100 mg Oral BID Earney Navy, NP   100 mg at 07/05/12 0851  . clopidogrel (PLAVIX) tablet 75 mg  75 mg Oral Daily Earney Navy, NP   75 mg at 07/05/12 0851  . insulin aspart (novoLOG) injection 0-20 Units  0-20 Units Subcutaneous TID WC Court Joy, PA-C   4 Units at 07/05/12 1227  . insulin glargine (LANTUS) injection 25 Units  25 Units Subcutaneous QHS Court Joy, PA-C   25 Units at 07/04/12 2237  . linagliptin (TRADJENTA) tablet 5 mg  5 mg Oral Daily Earney Navy, NP   5 mg at 07/05/12 0851  . magnesium hydroxide (MILK OF MAGNESIA) suspension 30 mL  30 mL Oral Daily PRN Earney Navy, NP      . metFORMIN (GLUCOPHAGE) tablet 500 mg  500 mg Oral BID WC Earney Navy, NP   500 mg at 07/05/12 0851  . metoprolol tartrate (LOPRESSOR) tablet 25 mg  25 mg Oral BID Earney Navy, NP  25 mg at 07/05/12 0851  . ondansetron (ZOFRAN) tablet 4 mg  4 mg Oral Q8H PRN Earney Navy, NP      . sertraline (ZOLOFT) tablet 25 mg  25 mg Oral Daily Larena Sox, MD   25 mg at 07/05/12 0851  . simvastatin (ZOCOR) tablet 40 mg  40 mg Oral QPM Earney Navy, NP   40 mg at 07/04/12 1741  . zolpidem (AMBIEN) tablet 5 mg  5 mg Oral QHS PRN Earney Navy, NP        Lab Results:  Results for orders placed during the hospital encounter of 07/03/12 (from the past 48 hour(s))  GLUCOSE, CAPILLARY     Status: Abnormal   Collection Time    07/03/12  9:06 PM      Result Value Range    Glucose-Capillary 234 (*) 70 - 99 mg/dL   Comment 1 Notify RN     Comment 2 Documented in Chart    GLUCOSE, CAPILLARY     Status: Abnormal   Collection Time    07/04/12  6:38 AM      Result Value Range   Glucose-Capillary 218 (*) 70 - 99 mg/dL  GLUCOSE, CAPILLARY     Status: Abnormal   Collection Time    07/04/12 11:55 AM      Result Value Range   Glucose-Capillary 225 (*) 70 - 99 mg/dL   Comment 1 Notify RN    GLUCOSE, CAPILLARY     Status: Abnormal   Collection Time    07/04/12  5:35 PM      Result Value Range   Glucose-Capillary 216 (*) 70 - 99 mg/dL   Comment 1 Documented in Chart     Comment 2 Notify RN    GLUCOSE, CAPILLARY     Status: Abnormal   Collection Time    07/04/12  9:01 PM      Result Value Range   Glucose-Capillary 209 (*) 70 - 99 mg/dL   Comment 1 Notify RN    GLUCOSE, CAPILLARY     Status: Abnormal   Collection Time    07/05/12  6:21 AM      Result Value Range   Glucose-Capillary 186 (*) 70 - 99 mg/dL  GLUCOSE, CAPILLARY     Status: Abnormal   Collection Time    07/05/12 12:09 PM      Result Value Range   Glucose-Capillary 183 (*) 70 - 99 mg/dL    Physical Findings: AIMS: Facial and Oral Movements Muscles of Facial Expression: None, normal Lips and Perioral Area: None, normal Jaw: None, normal Tongue: None, normal,Extremity Movements Upper (arms, wrists, hands, fingers): None, normal Lower (legs, knees, ankles, toes): None, normal, Trunk Movements Neck, shoulders, hips: None, normal, Overall Severity Severity of abnormal movements (highest score from questions above): None, normal Incapacitation due to abnormal movements: None, normal Patient's awareness of abnormal movements (rate only patient's report): No Awareness, Dental Status Current problems with teeth and/or dentures?: No Does patient usually wear dentures?: No  CIWA:    COWS:     Treatment Plan Summary: Daily contact with patient to assess and evaluate symptoms and progress in  treatment Medication management  Assessment:  AXIS I: Major Depression, Recurrent severe  AXIS II: No diagnosis  AXIS III:  Past Medical History   Diagnosis  Date   .  Diabetes mellitus type II    .  Hypertension    .  Stroke    .  Depression    .  S/P BKA (below knee amputation) unilateral    AXIS IV: other psychosocial or environmental problems  AXIS V: 21-30 behavior considerably influenced by delusions or hallucinations OR serious impairment in judgment, communication OR inability to function in almost all areas   Treatment Plan/Recommendations: As noted below  Treatment Plan Summary:  Daily contact with patient to assess and evaluate symptoms and progress in treatment  Medication management   Observation Level/Precautions: 15 minute checks   Laboratory: Reviewed   Psychotherapy: Patient will participate in groups.   Medications: Will Continue Zoloft 25 mg. Will continue tegretol. Will discontinue Zolpidem.  Consultations: None   Discharge Concerns: Medication compliance, ongoing memory problem   Estimated LOS: 5-7 days   Other: None      Medical Decision Making Problem Points:  Established problem, stable/improving (1) and Review of psycho-social stressors (1) Data Points:  Decision to obtain old records (1) Order Aims Assessment (2)  I certify that inpatient services furnished can reasonably be expected to improve the patient's condition.   Adalia Pettis 07/05/2012, 2:01 PM

## 2012-07-05 NOTE — Progress Notes (Signed)
Pt has been in bed sleeping all evening. Awakens easily. CBG was 209 (no hs coverage) and lantus was self admin without difficulty. Pt denying any issues and states he is just tired. Affect flat with mood depressed. Allowed to return to sleep. No SI/HI/AVH and remains safe. Chad Grant

## 2012-07-05 NOTE — Progress Notes (Signed)
Psychoeducational Group Note  Psychoeducational Group Note  Date: 07/05/2012 Time:  07/05/2012  Group Topic/Focus:  Gratefulness:  The focus of this group is to help patients identify what two things they are most grateful for in their lives. What helps ground them and to center them on their work to their recovery.  Participation Level:  Active  Participation Quality:  Appropriate  Affect:  Appropriate  Cognitive:  Appropriate  Insight:  Lacking  Engagement in Group:  Engaged  Additional Comments:   Angi Goodell A  

## 2012-07-05 NOTE — Progress Notes (Signed)
Psychoeducational Group Note  Date:  07/05/2012 Time:  1015  Group Topic/Focus:  Making Healthy Choices:   The focus of this group is to help patients identify negative/unhealthy choices they were using prior to admission and identify positive/healthier coping strategies to replace them upon discharge.  Participation Level:  Minimal  Participation Quality:  Inattentive  Affect:  Flat  Cognitive:  Alert  Insight:  Lacking  Engagement in Group:  Limited  Additional Comments:    Dione Housekeeper 07/05/2012

## 2012-07-05 NOTE — Progress Notes (Signed)
D) Pt states that he is not feeling very well today. States "I had diarrhea all over myself and the floor last night. Today I just haven't been myself. Just don't feel well". Took a nap this afternoon. Denies SI and HI Delusions and hallucinations. A) Given support, reassurance and frequent contact. Pt's room moved so that Pt can be closer to the dayroom and given a private room due to his inability to maintain his hygiene appropriately. R) Dneis SI and HI. Rates his depression and hopelessness both at a 5.

## 2012-07-05 NOTE — [facility] Group Notes (Signed)
[facility] LCSW Group Therapy  07/05/2012  10:00  Type of Therapy:  Group Therapy  Participation Level: Minimal  Participation Quality:  Attentive  Affect:  Appropriate  Cognitive:  Oriented  Insight:  Limited  Engagement in Therapy:  Engaged  Modes of Intervention:  Discussion, Exploration and Socialization  Summary of Progress/Problems:   The main focus of today's process group was to identify the patient's current support system and decide on other supports that can be put in place. Four definitions/levels of support were discussed and an exercise was utilized to show how much stronger we become with additional supports. An emphasis was placed on using counselor, doctor, therapy groups, 12-step groups, and problem-specific support groups to expand supports, as well as doing something different than has been done before. Chad Grant identified his family as his main support.  He could not identify any ideas on how to expand his support community, and dis not participate spontaneously in the discussion.  [place], LCSW 07/05/2012 3:22 PM

## 2012-07-05 NOTE — Progress Notes (Signed)
At 0245, Pt was found in bathroom with soiled pants. Fecal matter had been trailed on the floor from his bed to the bathroom. Pt repeatedly assured the Writer that he was "fine," was not sick, and did not need any help. The Writer brought the Pt gowns and clean underwear, but Pt was reluctant to change out of his pants, despite them being soiled with fecal matter. Pt was also using unclean toilet paper from a soiled roll to clean himself, but appeared unaware of the fact that it already had smeared feces on it. The Writer eventually convinced Pt to use a new roll of paper to clean himself. Pt was provided with towels, washcloths, soap and strongly encouraged to shower, but repeatedly said that he did not need to shower despite soiling himself. The Writer tended to cleaning the room and the Pt returned to the clean bed.

## 2012-07-06 ENCOUNTER — Ambulatory Visit: Payer: Self-pay | Admitting: Surgery

## 2012-07-06 LAB — GLUCOSE, CAPILLARY
Glucose-Capillary: 204 mg/dL — ABNORMAL HIGH (ref 70–99)
Glucose-Capillary: 133 mg/dL — ABNORMAL HIGH (ref 70–99)

## 2012-07-06 MED ORDER — INSULIN GLARGINE 100 UNIT/ML ~~LOC~~ SOLN: 20.0000 [IU] | Freq: Every day | SUBCUTANEOUS | Status: DC

## 2012-07-06 MED ORDER — SERTRALINE HCL 25 MG PO TABS: 25.0000 mg | ORAL_TABLET | Freq: Every day | ORAL | Status: DC

## 2012-07-06 MED ORDER — METOPROLOL TARTRATE 25 MG PO TABS: 25.0000 mg | ORAL_TABLET | Freq: Two times a day (BID) | ORAL | Status: DC

## 2012-07-06 MED ORDER — CARBAMAZEPINE ER 100 MG PO TB12: ORAL_TABLET | ORAL | Status: DC

## 2012-07-06 MED ORDER — METFORMIN HCL 500 MG PO TABS: 500.0000 mg | ORAL_TABLET | Freq: Two times a day (BID) | ORAL | Status: DC

## 2012-07-06 MED ORDER — SITAGLIPTIN PHOSPHATE 100 MG PO TABS: 100.0000 mg | ORAL_TABLET | Freq: Every day | ORAL | Status: DC

## 2012-07-06 MED ORDER — CLOPIDOGREL BISULFATE 75 MG PO TABS: 75.0000 mg | ORAL_TABLET | Freq: Every day | ORAL | Status: AC

## 2012-07-06 NOTE — Plan of Care (Signed)
Problem: Ineffective individual coping Goal: STG: Patient will participate in after care plan Outcome: Completed/Met Date Met:  07/06/12 Patient has attended groups and engaged in discussion.  His follow up is scheduled.  Horace Porteous Jiovanna Frei, LCSW 07/06/2012

## 2012-07-06 NOTE — Discharge Summary (Signed)
Physician Discharge Summary Note  Patient:  Chad Grant is an 62 y.o., male MRN:  [id number] DOB:  [birth year] Patient phone:  [phone] (home)  Patient address:   [address] [place] [address],   Date of Admission:  07/03/2012 Date of Discharge: 07/06/12  Reason for Admission:  Depression with neglect in self care including medication compliance  Discharge Diagnoses: Active Problems:   * No active hospital problems. *  Review of Systems  Constitutional: Negative.   HENT: Negative.   Eyes: Negative.   Respiratory: Negative.   Cardiovascular: Negative.   Gastrointestinal: Negative.   Genitourinary: Negative.   Musculoskeletal: Negative.   Skin: Negative.   Neurological: Negative.   Endo/Heme/Allergies: Negative.   Psychiatric/Behavioral: Positive for depression. Negative for suicidal ideas, hallucinations, memory loss and substance abuse. The patient is not nervous/anxious and does not have insomnia.    Axis Diagnosis:   AXIS I:  Major Depression, Recurrent severe AXIS II:  Deferred AXIS III:   Past Medical History  Diagnosis Date  . Diabetes mellitus type II   . Hypertension   . Stroke   . Depression   . S/P BKA (below knee amputation) unilateral    AXIS IV:  other psychosocial or environmental problems and problems related to social environment AXIS V:  61-70 mild symptoms  Level of Care:  OP  Hospital Course:  Chad Grant is an 63 y.o. male. Who presents to the ED with worsening depression. Patient denies SI/AH/VH. Patient report HI due to depression. Patient reports severe depression of insomnia, feelings of hopelessness, anhedonia, feelings of worthlessness, increase anger and irritability, guilt, and fatigue. Patient family reports that patient has been refusing medication and insulin and wants to be left alone. Patient reports he has had thoughts of hurting other because he wants to be left alone. Pt is very tearful during assessment. Pt reports history of  depression. Pt recent stressors are medical issues including Status post below knee amputation. Patient was evaluated by psychiatrist and recommended inpatient due to patient refusing care and withdrawing from care and caregivers.   The duration of stay was three days. The patient was seen and evaluated by the Treatment team consisting of Psychiatrist, NP-C, RN, Case Manager, and Therapist for evaluation and treatment plan with goal of stabilization upon discharge. The patient's physical and mental health problems were identified and treated appropriately. Patient's blood sugars were monitored during his stay and ranged from 114-267. His medications for high blood pressure were also continued and his vitals remained stable with treatment.       Multiple modalities of treatment were used including medication, individual and group therapies, unit programming, improved nutrition, physical activity, and family sessions as needed. Patient's Tegretol 100 mg every twelve hours was continued upon admission. Chad Grant was started on Zoloft 25 mg to help with depressive symptoms. He had previously been on wellbutrin but his wife felt that this was causing agitation. Patient's Chad Grant was discontinued during his hospital admission due to concerns about increased confusion. Patient was made a do not admit as he had trouble with his hygiene and was reported by staff to be soiling himself and not making it to the toilet. Patient was compliant with his       The symptoms of depression were monitored daily by evaluation by clinical provider.  The patient's mental and emotional status was evaluated by a daily self inventory completed by the patient. Improvement was demonstrated by declining numbers on the self assessment, improving vital signs, increased  cognition, and improvement in mood, sleep, appetite as well as a reduction in physical symptoms.     The patient was evaluated and found to be stable enough for discharge and was  released to home per the initial plan of treatment. Patient admitted to forgetting his medications for long periods of time stating to the treatment team on his day of discharge "I just forget to take them." He rated his symptoms of depression and anxiety at zero. Denied SI. Patient's wife agrees to help him remember to take his medications after discharge. He will continue to follow up with Dr. Dan Humphreys at the [facility].   Mental Status Exam:  For mental status exam please see mental status exam and  suicide risk assessment completed by attending physician prior to discharge.  Consults:  None  Significant Diagnostic Studies:  labs: Chem profile, CBC  Discharge Vitals:   Blood pressure 125/76, pulse 62, temperature 97.2 F (36.2 C), temperature source Oral, resp. rate 20, height 5\' 9"  (1.753 m), weight 73.936 kg (163 lb), SpO2 98.00%. Body mass index is 24.06 kg/(m^2). Lab Results:   Results for orders placed during the hospital encounter of 07/03/12 (from the past 72 hour(s))  GLUCOSE, CAPILLARY     Status: Abnormal   Collection Time    07/03/12  5:11 PM      Result Value Range   Glucose-Capillary 319 (*) 70 - 99 mg/dL   Comment 1 Notify RN     Comment 2 Documented in Chart    GLUCOSE, CAPILLARY     Status: Abnormal   Collection Time    07/03/12  9:06 PM      Result Value Range   Glucose-Capillary 234 (*) 70 - 99 mg/dL   Comment 1 Notify RN     Comment 2 Documented in Chart    GLUCOSE, CAPILLARY     Status: Abnormal   Collection Time    07/04/12  6:38 AM      Result Value Range   Glucose-Capillary 218 (*) 70 - 99 mg/dL  GLUCOSE, CAPILLARY     Status: Abnormal   Collection Time    07/04/12 11:55 AM      Result Value Range   Glucose-Capillary 225 (*) 70 - 99 mg/dL   Comment 1 Notify RN    GLUCOSE, CAPILLARY     Status: Abnormal   Collection Time    07/04/12  5:35 PM      Result Value Range   Glucose-Capillary 216 (*) 70 - 99 mg/dL   Comment 1 Documented in Chart      Comment 2 Notify RN    GLUCOSE, CAPILLARY     Status: Abnormal   Collection Time    07/04/12  9:01 PM      Result Value Range   Glucose-Capillary 209 (*) 70 - 99 mg/dL   Comment 1 Notify RN    GLUCOSE, CAPILLARY     Status: Abnormal   Collection Time    07/05/12  6:21 AM      Result Value Range   Glucose-Capillary 186 (*) 70 - 99 mg/dL  GLUCOSE, CAPILLARY     Status: Abnormal   Collection Time    07/05/12 12:09 PM      Result Value Range   Glucose-Capillary 183 (*) 70 - 99 mg/dL  GLUCOSE, CAPILLARY     Status: Abnormal   Collection Time    07/05/12  5:13 PM      Result Value Range   Glucose-Capillary  267 (*) 70 - 99 mg/dL   Comment 1 Documented in Chart     Comment 2 Notify RN    GLUCOSE, CAPILLARY     Status: Abnormal   Collection Time    07/06/12  6:54 AM      Result Value Range   Glucose-Capillary 114 (*) 70 - 99 mg/dL  GLUCOSE, CAPILLARY     Status: Abnormal   Collection Time    07/06/12 11:58 AM      Result Value Range   Glucose-Capillary 204 (*) 70 - 99 mg/dL   Comment 1 Notify RN      Physical Findings: AIMS: Facial and Oral Movements Muscles of Facial Expression: None, normal Lips and Perioral Area: None, normal Jaw: None, normal Tongue: None, normal,Extremity Movements Upper (arms, wrists, hands, fingers): None, normal Lower (legs, knees, ankles, toes): None, normal, Trunk Movements Neck, shoulders, hips: None, normal, Overall Severity Severity of abnormal movements (highest score from questions above): None, normal Incapacitation due to abnormal movements: None, normal Patient's awareness of abnormal movements (rate only patient's report): No Awareness, Dental Status Current problems with teeth and/or dentures?: No Does patient usually wear dentures?: No  CIWA:    COWS:     Psychiatric Specialty Exam: See Psychiatric Specialty Exam and Suicide Risk Assessment completed by Attending Physician prior to discharge.  Discharge destination:  Home  Is  patient on multiple antipsychotic therapies at discharge:  No   Has Patient had three or more failed trials of antipsychotic monotherapy by history:  No  Recommended Plan for Multiple Antipsychotic Therapies: N/A  Discharge Orders   Future Orders Complete By Expires     Activity as tolerated - No restrictions  As directed         Medication List    TAKE these medications     Indication   carbamazepine 100 MG 12 hr tablet  Commonly known as:  TEGRETOL-XR  Take by mouth 1 at bed for the first night or 2, then 2 at night for 2 nights, then 3 at night. Then AFTER 5 days of stable dose get labs   Indication:  Brain Injury     clopidogrel 75 MG tablet  Commonly known as:  PLAVIX  Take 1 tablet (75 mg total) by mouth daily.   Indication:  Disease of the Peripheral Arteries     insulin glargine 100 UNIT/ML injection  Commonly known as:  LANTUS  Inject 0.2-0.25 mLs (20-25 Units total) into the skin at bedtime. 20 units normally, if sugar above 180 will take additonal.   Indication:  Type 2 Diabetes     metFORMIN 500 MG tablet  Commonly known as:  GLUCOPHAGE  Take 1 tablet (500 mg total) by mouth 2 (two) times daily with a meal.   Indication:  Type 2 Diabetes     metoprolol tartrate 25 MG tablet  Commonly known as:  LOPRESSOR  Take 1 tablet (25 mg total) by mouth 2 (two) times daily.   Indication:  High Blood Pressure     NOVOFINE 32G X 6 MM Misc  Generic drug:  Insulin Pen Needle      sertraline 25 MG tablet  Commonly known as:  ZOLOFT  Take 1 tablet (25 mg total) by mouth daily.   Indication:  Major Depressive Disorder     simvastatin 40 MG tablet  Commonly known as:  ZOCOR  Take 40 mg by mouth every evening.      sitaGLIPtin 100 MG tablet  Commonly known as:  JANUVIA  Take 1 tablet (100 mg total) by mouth daily.   Indication:  Type 2 Diabetes     TYLENOL EXTRA STRENGTH PO  Take 1 tablet by mouth as needed.            Follow-up Information   Follow up with  Dr. Dan Humphreys - [facility]  On 07/10/2012. (Friday, July 10, 2012 at 8:45 Am)    Contact information:   [address] [place], [place]   [address]  [phone]      Follow-up recommendations:  Activity:  Resume usual activities Diet:  Diabetic Diet Tests:  Patient to continue blood sugar monitoring. Tegretol levels to be monitored by his outpatient provider.   Comments:   Take all your medications as prescribed by your mental healthcare provider.  Report any adverse effects and or reactions from your medicines to your outpatient provider promptly.  Patient is instructed and cautioned to not engage in alcohol and or illegal drug use while on prescription medicines.  In the event of worsening symptoms, patient is instructed to call the crisis hotline, 911 and or go to the nearest ED for appropriate evaluation and treatment of symptoms.  Follow-up with your primary care provider for your other medical issues, concerns and or health care needs.   Total Discharge Time:  Greater than 30 minutes. SignedFransisca Kaufmann NP-C 07/06/2012, 12:15 PM  Patient was personally examined and case discussed with the physician extender and developed treatment plan. Reviewed the information documented and agree with the discharge treatment plan.   Viona Hosking,JANARDHAHA R. 07/07/2012 6:47 PM

## 2012-07-06 NOTE — [facility] Group Notes (Signed)
[facility] LCSW Aftercare Discharge Planning Group Note   07/06/2012 11:50 AM  Participation Quality:  Appropriate  Mood/Affect:  Appropriate  Depression Rating:  0  Anxiety Rating:  0  Thoughts of Suicide:  No Will you contract for safety?   NA  Current AVH:  No  Plan for Discharge/Comments: Patient reports doing well and being ready to discharge home today.  He denies SI/HI and rates all symptoms at zero.  Patient advised his wife will transport him home.  Transportation Means: Patient has transportation.   Supports:  Patient has a good support system.   Chad Grant, Joesph July

## 2012-07-06 NOTE — Progress Notes (Signed)
Pt. Denies SI/HI.  Pt.s' belongings returned to pt..  Pt. Discharged from unit.  Encouragement and support given.  Pt. Receptive.

## 2012-07-06 NOTE — Progress Notes (Signed)
D/C instructions/meds/follow-up appointments reviewed, pt verbalized understanding. 

## 2012-07-06 NOTE — [facility] Suicide Risk Assessment (Signed)
[facility] INPATIENT:  Family/Significant Other Suicide Prevention Education  Suicide Prevention Education:  Education Completed; Bliss Tsang, [phone];  has been identified by the patient as the family member/significant other with whom the patient will be residing, and identified as the person(s) who will aid the patient in the event of a mental health crisis (suicidal ideations/suicide attempt).  With written consent from the patient, the family member/significant other has been provided the following suicide prevention education, prior to the and/or following the discharge of the patient.  The suicide prevention education provided includes the following:  Suicide risk factors  Suicide prevention and interventions  National Suicide Hotline telephone number  [facility] assessment telephone number  [facility] Emergency Assistance 911  [facility] and/or Residential Mobile Crisis Unit telephone number  Request made of family/significant other to:  Remove weapons (e.g., guns, rifles, knives), all items previously/currently identified as safety concern.  Wife advised there are no guns in the home.  Remove drugs/medications (over-the-counter, prescriptions, illicit drugs), all items previously/currently identified as a safety concern.  The family member/significant other verbalizes understanding of the suicide prevention education information provided.  The family member/significant other agrees to remove the items of safety concern listed above.  Wynn Banker 07/06/2012, 11:36 AM

## 2012-07-06 NOTE — Tx Team (Signed)
Interdisciplinary Treatment Plan Update   Date Reviewed:  07/06/2012  Time Reviewed:  9:49 AM  Progress in Treatment:   Attending groups: Yes Participating in groups: Yes Taking medication as prescribed: Yes  Tolerating medication: Yes Family/Significant other contact made: Yes, contact made with wife.  Patient understands diagnosis: Yes  Discussing patient identified problems/goals with staff: Yes Medical problems stabilized or resolved: Yes Denies suicidal/homicidal ideation: Yes Patient has not harmed self or others: Yes  For review of initial/current patient goals, please see plan of care.  Estimated Length of Stay:  Discharge home today.  Reasons for Continued Hospitalization:   New Problems/Goals identified:    Discharge Plan or Barriers:   Home with outpatient follow up with Dr. Dan Humphreys  Additional Comments:  Patient reports doing well and being ready to discharge home today.  He denies SI/HI and rates symptoms at zero.  Attendees:  Patient:  Chad Grant 07/06/2012 9:49 AM   Signature: Mervyn Gay, MD 07/06/2012 9:49 AM  Signature: 07/06/2012 9:49 AM  Signature:  07/06/2012 9:49 AM  Signature:Beverly Terrilee Croak, RN 07/06/2012 9:49 AM  Signature:  Neill Loft RN 07/06/2012 9:49 AM  Signature:  Juline Patch, LCSW 07/06/2012 9:49 AM  Signature:   07/06/2012 9:49 AM  Signature:  Maseta Dorley,Care Coordinator 07/06/2012 9:49 AM  Signature: Fransisca Kaufmann, [facility] 07/06/2012 9:49 AM  Signature:    Signature:    Signature:      Scribe for Treatment Team:   Juline Patch,  07/06/2012 9:49 AM

## 2012-07-06 NOTE — Plan of Care (Signed)
Problem: Alteration in mood Goal: LTG-Pt's behavior demonstrates decreased signs of depression (Patient's behavior demonstrates decreased signs of depression to the point the patient is safe to return home and continue treatment in an outpatient setting)  Outcome: Completed/Met Date Met:  07/06/12 Patient is rating depression at zero. He has attended groups and engaged in discussions.  Horace Porteous Josue Falconi, LCSW ,07/06/2012

## 2012-07-06 NOTE — Progress Notes (Signed)
[facility] Adult Case Management Discharge Plan :  Will you be returning to the same living situation after discharge: Yes,  Patient returning home with wife. At discharge, do you have transportation home?:Yes,  Patient has transportation home. Do you have the ability to pay for your medications:Yes,  Patient can afford to get medications.  Release of information consent forms completed and in the chart;  Patient's signature needed at discharge.  Patient to Follow up at: Follow-up Information   Follow up with Dr. Dan Humphreys - [facility]  On 07/10/2012. (Friday, July 10, 2012 at 8:45 Am)    Contact information:   [address] [place], [place]   [address]  [phone]      Patient denies SI/HI:   Patient no longer endorses SI/other thought self harm     Safety Planning and Suicide Prevention discussed: .Reviewed with all patients during discharge planning group   Tierre Gerard, Joesph July 07/06/2012, 11:53 AM

## 2012-07-06 NOTE — [facility] Suicide Risk Assessment (Signed)
Suicide Risk Assessment  Discharge Assessment     Demographic Factors:  Male, Low socioeconomic status, Unemployed and disabled  Mental Status Per Nursing Assessment::   On Admission:  Self-harm thoughts  Current Mental Status by Physician: patient has been calm, cooperative and has no evidence of distress. he has been feeling better and has appropriate affect. he has denied SI/HI and has no evidence of psychosis.   Loss Factors: Financial problems/change in socioeconomic status and non compliance with medications  Historical Factors: Prior suicide attempts and Impulsivity  Risk Reduction Factors:   Sense of responsibility to family, Religious beliefs about death, Living with another person, especially a relative, Positive social support and Positive therapeutic relationship  Continued Clinical Symptoms:  Depression:   Anhedonia Hopelessness Recent sense of peace/wellbeing Severe Previous Psychiatric Diagnoses and Treatments Medical Diagnoses and Treatments/Surgeries  Cognitive Features That Contribute To Risk:  Polarized thinking    Suicide Risk:  Minimal: No identifiable suicidal ideation.  Patients presenting with no risk factors but with morbid ruminations; may be classified as minimal risk based on the severity of the depressive symptoms  Discharge Diagnoses:   AXIS I:  Major Depression, Recurrent severe AXIS II:  Deferred AXIS III:   Past Medical History  Diagnosis Date  . Diabetes mellitus type II   . Hypertension   . Stroke   . Depression   . S/P BKA (below knee amputation) unilateral    AXIS IV:  other psychosocial or environmental problems and problems related to social environment AXIS V:  51-60 moderate symptoms  Plan Of Care/Follow-up recommendations:  Activity:  as tolerated Diet:  regular  Is patient on multiple antipsychotic therapies at discharge:  No   Has Patient had three or more failed trials of antipsychotic monotherapy by history:   No  Recommended Plan for Multiple Antipsychotic Therapies: Not applicable   Mikylah Ackroyd,JANARDHAHA R. 07/06/2012, 12:18 PM

## 2012-07-06 NOTE — Progress Notes (Signed)
Adult Psychoeducational Group Note  Date:  07/06/2012 Time:  1:18 PM  Group Topic/Focus:  Self Care:   The focus of this group is to help patients understand the importance of self-care in order to improve or restore emotional, physical, spiritual, interpersonal, and financial health.  Participation Level:  None  Participation Quality:  Attentive  Affect:  Appropriate  Cognitive:  Lacking  Insight: None  Engagement in Group:  None  Modes of Intervention:  Activity, Discussion, Education, Exploration, Limit-setting and Support  Additional Comments: [name] attended group and did not share during group. Patient was given a self care assessment and did not complete form and did not give incite of what the weakness and  strengths of physical, psychological, spiritual, emotional, relationship care, based on how the patient rated the areas of assessment. Patient concluded with making a goal on how to improve in the areas that need attention.  Chad Grant 07/06/2012, 1:18 PM

## 2012-07-07 NOTE — Progress Notes (Signed)
Patient Discharge Instructions:  Next Level Care Provider Has Access to the EMR, 07/07/12 Records provided to [facility] via [facility]/Epic access.  Chad Grant, 07/07/2012, 11:29 AM

## 2012-07-09 ENCOUNTER — Ambulatory Visit (HOSPITAL_COMMUNITY): Payer: Self-pay | Admitting: Psychiatry

## 2012-07-10 ENCOUNTER — Ambulatory Visit (HOSPITAL_COMMUNITY): Payer: Self-pay | Admitting: Psychiatry

## 2012-07-13 ENCOUNTER — Ambulatory Visit (INDEPENDENT_AMBULATORY_CARE_PROVIDER_SITE_OTHER): Payer: BC Managed Care – PPO | Admitting: Psychiatry

## 2012-07-13 ENCOUNTER — Encounter (HOSPITAL_COMMUNITY): Payer: Self-pay | Admitting: Psychiatry

## 2012-07-13 VITALS — BP 130/78 | HR 78 | Wt 160.8 lb

## 2012-07-13 DIAGNOSIS — F329 Major depressive disorder, single episode, unspecified: Secondary | ICD-10-CM

## 2012-07-13 MED ORDER — SERTRALINE HCL 25 MG PO TABS: 25.0000 mg | ORAL_TABLET | Freq: Every day | ORAL | Status: DC

## 2012-07-13 MED ORDER — CARBAMAZEPINE ER 100 MG PO TB12: ORAL_TABLET | ORAL | Status: DC

## 2012-07-13 NOTE — Progress Notes (Signed)
[facility] ([facility] [address] Progress Note Federico Maiorino MRN: [id number] DOB: 02-27-1950 Age: 62 y.o.  Date: 07/13/2012 Start Time: 3:00 PM End Time: 3:25 PM  Chief Complaint: Chief Complaint  Patient presents with  . Depression  . Follow-up  . Medication Refill   Subjective: "She drives me nuts.  She sits here and tells you that I don't take my meds and she is not around". Depression 0/10 and Anxiety 0/10, where 0 is none and 10 is the worst.  Pain is 0/10.   History of Present Illness The patient returns for follow-up appointment with his wife.  Pt reports that he is compliant with the psychotropic medications with no benefit and no noticeable side effects.  He was admited and started on Zoloft.  He is out now and is standing up walking faster and smiling and laughing.  His wife joined [place] during the session and reported that she had not seen him take any shots since discharge.  She left for the weekend and left his pills in a planner and nothing was taken for the whole weekend.  The wife esplinas that she is making plans to separatre from him so he can do what he wants to do. After she left, he denies that there is a problem and denies that he has any depression or is acting like he wants to be dead or anything of the such.  It appears to me that he has given up on living.  He is in the throws of an existential crisis and is giving up.  The wife is challenged by his giving up.  She reported at the last visit that she has stopped loving him because it hurts too much to see him act like he wants to die with his every move or lack of movement.  NEW DIAGNOSIS not available in DSM: Existential crisis and wants to be dead. In complete and total denial of that.    Current psychiatric medication Tegretol XR 100 mg twice a day Zoloft 25 mg every AM except there is reason to suspect that he missed all his weekend meds.  Psychiatric history Patient has been seeing in this office since 2009.  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  Since then patient has been notice very depressed and isolated.  He denies any history of inpatient psychiatric treatment or any suicidal attempt.  In the past he had tried Depakote Celexa Xanax temazepam Cymbalta nortriptyline Ritalin however patient either due to side effects or had a poor response with these medication.  Vitals: BP 130/78  Pulse 78  Wt 160 lb 12.8 oz (72.938 kg)  BMI 23.74 kg/m2  Allergies: No Known Allergies Medical History: Past Medical History  Diagnosis Date  . Diabetes mellitus type II   . Hypertension   . Stroke   . Depression   . S/P BKA (below knee amputation) unilateral   Patient has history of CVA with weakness on one side. Patient has diabetes, hypertension and hyperlipidemia. Patient sees Dr. Sherryll Burger in [place].  His last hemoglobin A1c is 8.5.  Surgical History: Past Surgical History  Procedure Laterality Date  . Left leg amputa    . Left leg amputated    . Shoulder surgery    . Cataract removed     Family History: family history includes Alcohol abuse in his brother.  There is no history of Anxiety disorder, and Bipolar disorder, and Dementia, and Depression, and Drug abuse, and Paranoid  behavior, and Schizophrenia, and OCD, and Seizures, and Sexual abuse, and Physical abuse, and ADD / ADHD, . Reviewed in office today and nothing has changed.  Mental status examination Patient is casually dressed and fairly groomed. He maintained fair eye contact. He described his mood better and his affect is improved from the past. His speech is slow but he has poverty of thought content.  He denies any paranoid thinking or any delusions. He denies any active or passive suicidal thinking and homicidal thinking. His attention and concentration is fair.  He denies any auditory or visual hallucination.  There were no delusion present.  He is difficult to engage in conversation.  He is alert and oriented  x3. His insight judgment and impulse control is okay.  Lab Results:  Results for orders placed during the hospital encounter of 07/03/12 (from the past 2016 hour(s))  GLUCOSE, CAPILLARY   Collection Time    07/03/12  5:11 PM      Result Value Range   Glucose-Capillary 319 (*) 70 - 99 mg/dL   Comment 1 Notify RN     Comment 2 Documented in Chart    GLUCOSE, CAPILLARY   Collection Time    07/03/12  9:06 PM      Result Value Range   Glucose-Capillary 234 (*) 70 - 99 mg/dL   Comment 1 Notify RN     Comment 2 Documented in Chart    GLUCOSE, CAPILLARY   Collection Time    07/04/12  6:38 AM      Result Value Range   Glucose-Capillary 218 (*) 70 - 99 mg/dL  GLUCOSE, CAPILLARY   Collection Time    07/04/12 11:55 AM      Result Value Range   Glucose-Capillary 225 (*) 70 - 99 mg/dL   Comment 1 Notify RN    GLUCOSE, CAPILLARY   Collection Time    07/04/12  5:35 PM      Result Value Range   Glucose-Capillary 216 (*) 70 - 99 mg/dL   Comment 1 Documented in Chart     Comment 2 Notify RN    GLUCOSE, CAPILLARY   Collection Time    07/04/12  9:01 PM      Result Value Range   Glucose-Capillary 209 (*) 70 - 99 mg/dL   Comment 1 Notify RN    GLUCOSE, CAPILLARY   Collection Time    07/05/12  6:21 AM      Result Value Range   Glucose-Capillary 186 (*) 70 - 99 mg/dL  GLUCOSE, CAPILLARY   Collection Time    07/05/12 12:09 PM      Result Value Range   Glucose-Capillary 183 (*) 70 - 99 mg/dL  GLUCOSE, CAPILLARY   Collection Time    07/05/12  5:13 PM      Result Value Range   Glucose-Capillary 267 (*) 70 - 99 mg/dL   Comment 1 Documented in Chart     Comment 2 Notify RN    GLUCOSE, CAPILLARY   Collection Time    07/05/12  9:31 PM      Result Value Range   Glucose-Capillary 158 (*) 70 - 99 mg/dL  GLUCOSE, CAPILLARY   Collection Time    07/06/12  6:54 AM      Result Value Range   Glucose-Capillary 114 (*) 70 - 99 mg/dL  GLUCOSE, CAPILLARY   Collection Time    07/06/12 11:58  AM      Result Value Range   Glucose-Capillary 204 (*) 70 -  99 mg/dL   Comment 1 Notify RN    GLUCOSE, CAPILLARY   Collection Time    07/06/12  5:15 PM      Result Value Range   Glucose-Capillary 133 (*) 70 - 99 mg/dL  Results for orders placed during the hospital encounter of 07/02/12 (from the past 2016 hour(s))  CBC   Collection Time    07/02/12  5:25 PM      Result Value Range   WBC 6.5  4.0 - 10.5 K/uL   RBC 5.04  4.22 - 5.81 MIL/uL   Hemoglobin 14.6  13.0 - 17.0 g/dL   HCT 78.2  95.6 - 21.3 %   MCV 82.5  78.0 - 100.0 fL   MCH 29.0  26.0 - 34.0 pg   MCHC 35.1  30.0 - 36.0 g/dL   RDW 08.6  57.8 - 46.9 %   Platelets 145 (*) 150 - 400 K/uL  COMPREHENSIVE METABOLIC PANEL   Collection Time    07/02/12  5:25 PM      Result Value Range   Sodium 135  135 - 145 mEq/L   Potassium 4.3  3.5 - 5.1 mEq/L   Chloride 97  96 - 112 mEq/L   CO2 23  19 - 32 mEq/L   Glucose, Bld 348 (*) 70 - 99 mg/dL   BUN 16  6 - 23 mg/dL   Creatinine, Ser 6.29  0.50 - 1.35 mg/dL   Calcium 9.7  8.4 - 52.8 mg/dL   Total Protein 8.1  6.0 - 8.3 g/dL   Albumin 4.1  3.5 - 5.2 g/dL   AST 18  0 - 37 U/L   ALT 28  0 - 53 U/L   Alkaline Phosphatase 86  39 - 117 U/L   Total Bilirubin 0.3  0.3 - 1.2 mg/dL   GFR calc non Af Amer >90  >90 mL/min   GFR calc Af Amer >90  >90 mL/min  ETHANOL   Collection Time    07/02/12  5:25 PM      Result Value Range   Alcohol, Ethyl (B) <11  0 - 11 mg/dL  URINE RAPID DRUG SCREEN (HOSP PERFORMED)   Collection Time    07/02/12  6:14 PM      Result Value Range   Opiates NONE DETECTED  NONE DETECTED   Cocaine NONE DETECTED  NONE DETECTED   Benzodiazepines NONE DETECTED  NONE DETECTED   Amphetamines NONE DETECTED  NONE DETECTED   Tetrahydrocannabinol NONE DETECTED  NONE DETECTED   Barbiturates NONE DETECTED  NONE DETECTED  GLUCOSE, CAPILLARY   Collection Time    07/02/12  8:56 PM      Result Value Range   Glucose-Capillary 267 (*) 70 - 99 mg/dL  GLUCOSE, CAPILLARY    Collection Time    07/03/12 12:28 AM      Result Value Range   Glucose-Capillary 167 (*) 70 - 99 mg/dL  GLUCOSE, CAPILLARY   Collection Time    07/03/12  2:45 PM      Result Value Range   Glucose-Capillary 310 (*) 70 - 99 mg/dL  Results for orders placed during the hospital encounter of 04/29/12 (from the past 2016 hour(s))  POCT I-STAT, CHEM 8   Collection Time    04/29/12  8:04 AM      Result Value Range   Sodium 140  135 - 145 mEq/L   Potassium 4.3  3.5 - 5.1 mEq/L   Chloride 103  96 - 112 mEq/L  BUN 15  6 - 23 mg/dL   Creatinine, Ser 1.61  0.50 - 1.35 mg/dL   Glucose, Bld 096 (*) 70 - 99 mg/dL   Calcium, Ion 0.45  4.09 - 1.30 mmol/L   TCO2 27  0 - 100 mmol/L   Hemoglobin 15.0  13.0 - 17.0 g/dL   HCT 81.1  91.4 - 78.2 %  GLUCOSE, CAPILLARY   Collection Time    04/29/12 10:13 AM      Result Value Range   Glucose-Capillary 103 (*) 70 - 99 mg/dL  Last NF6O was 8.5  Assessment Axis I Major depressive disorder, existential crisis Axis II deferred Axis III see medical history Axis IV moderate  Plan/Discussion: I took his vitals.  I reviewed CC, tobacco/med/surg Hx, meds effects/ side effects, problem list, therapies and responses as well as current situation/symptoms discussed options. Continue current effective medications. See orders and pt instructions for more details.  MEDICATIONS this encounter: Meds ordered this encounter  Medications  . sertraline (ZOLOFT) 25 MG tablet    Sig: Take 1 tablet (25 mg total) by mouth daily.    Dispense:  30 tablet    Refill:  1  . carbamazepine (TEGRETOL-XR) 100 MG 12 hr tablet    Sig: Take by mouth 1 twice a day    Dispense:  60 tablet    Refill:  1   Medical Decision Making Problem Points:  Established problem, stable/improving (1), Review of last therapy session (1) and Review of psycho-social stressors (1) Data Points:  Review or order clinical lab tests (1) Review of medication regiment & side effects (2)   I certify  that outpatient services furnished can reasonably be expected to improve the patient's condition.   Orson Aloe, MD, [facility]

## 2012-07-13 NOTE — Patient Instructions (Signed)
Take meds as prescribed.  Write down any questions or problems with the medications.  Call if problems or concerns.

## 2012-08-11 ENCOUNTER — Ambulatory Visit (HOSPITAL_COMMUNITY): Payer: Self-pay | Admitting: Psychiatry

## 2012-09-09 ENCOUNTER — Ambulatory Visit (INDEPENDENT_AMBULATORY_CARE_PROVIDER_SITE_OTHER): Payer: BC Managed Care – PPO | Admitting: Psychiatry

## 2012-09-09 ENCOUNTER — Encounter (HOSPITAL_COMMUNITY): Payer: Self-pay | Admitting: Psychiatry

## 2012-09-09 VITALS — BP 138/78 | Ht 69.0 in | Wt 162.0 lb

## 2012-09-09 DIAGNOSIS — F329 Major depressive disorder, single episode, unspecified: Secondary | ICD-10-CM

## 2012-09-09 DIAGNOSIS — F332 Major depressive disorder, recurrent severe without psychotic features: Secondary | ICD-10-CM

## 2012-09-09 MED ORDER — FLUOXETINE HCL 20 MG PO CAPS: 20.0000 mg | ORAL_CAPSULE | Freq: Every day | ORAL | Status: DC

## 2012-09-09 MED ORDER — CARBAMAZEPINE ER 100 MG PO TB12: ORAL_TABLET | ORAL | Status: DC

## 2012-09-09 NOTE — Progress Notes (Signed)
Patient ID: Chad Grant, male   DOB: March 17, 1950, 62 y.o.   MRN: [id number] [facility] [address] Progress Note Chad Grant MRN: [id number] DOB: 08-29-1950 Age: 62 y.o.  Date: 09/09/2012 Start Time: 3:00 PM End Time: 3:25 PM  Chief Complaint: Chief Complaint  Patient presents with  . Depression  . Medication Refill   Subjective: "I just sit around all day."  This patient is a 62 year old married black male who lives with his wife in [place]. He has 2 children who are grown. 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  Apparently when the patient was in better health and used to work he was a happy go lucky person according to his wife. Since she's had the amputation he has gone downhill. In fact he was hospitalized in June for severe depression. He's been on numerous medications in the past. He used to be angry and aggressive but now seems blunted and shut down. He has no interest in life. He sleeps all day or watches TV. He refuses to bathe which is a big problem for his wife. He is very defensive and gets angry when I questioned his wife about his status, almost to the point of paranoia. He claims he can't sleep but his wife reports that he sleeps through the day and not at night. He denies any pain.  He has no interest other than working in the yard with his son. He does not see any friends. He used to be in therapy with Dr. Shelva Majestic but claims that he doesn't want to see him again and is currently not seeing a therapist.    Current psychiatric medication Tegretol XR 100 mg twice a day Zoloft 25 mg every AM except there is reason to suspect that he missed all his weekend meds.  Psychiatric history Patient has been seeing in this office since 2009.  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  Since then patient has been notice very depressed and isolated.    In the past he had tried  Depakote Celexa Xanax temazepam Cymbalta nortriptyline Ritalin however patient either due to side effects or had a poor response with these medication.  Vitals: BP 138/78  Ht 5\' 9"  (1.753 m)  Wt 162 lb (73.483 kg)  BMI 23.91 kg/m2  Allergies: No Known Allergies Medical History: Past Medical History  Diagnosis Date  . Diabetes mellitus type II   . Hypertension   . Stroke   . Depression   . S/P BKA (below knee amputation) unilateral   Patient has history of CVA with weakness on one side. Patient has diabetes, hypertension and hyperlipidemia. Patient sees Dr. Sherryll Burger in [place].  His last hemoglobin A1c is 8.5.  Surgical History: Past Surgical History  Procedure Laterality Date  . Left leg amputa    . Left leg amputated    . Shoulder surgery    . Cataract removed     Family History: family history includes Alcohol abuse in his brother. There is no history of Anxiety disorder, Bipolar disorder, Dementia, Depression, Drug abuse, Paranoid behavior, Schizophrenia, OCD, Seizures, Sexual abuse, Physical abuse, or ADD / ADHD. Reviewed in office today and nothing has changed.  Mental status examination Patient is casually dressed and fairly groomed. He maintained fair eye contact. He described his mood as low. His speech is slow but he has poverty of thought content.  He denies any paranoid thinking or any delusions but he  became defensive and hostile when I asked his wife about his status. He denies any active or passive suicidal thinking and homicidal thinking. His attention and concentration is fair.  He denies any auditory or visual hallucination.  There were no delusion present.  He is difficult to engage in conversation.  He is alert and oriented x3. His insight judgmen are poor and he is unable to care for himself t and impulse control is okay. He is blunted and has very little energy  Lab Results:  Results for orders placed during the hospital encounter of 07/03/12 (from the past 2016  hour(s))  GLUCOSE, CAPILLARY   Collection Time    07/03/12  5:11 PM      Result Value Range   Glucose-Capillary 319 (*) 70 - 99 mg/dL   Comment 1 Notify RN     Comment 2 Documented in Chart    GLUCOSE, CAPILLARY   Collection Time    07/03/12  9:06 PM      Result Value Range   Glucose-Capillary 234 (*) 70 - 99 mg/dL   Comment 1 Notify RN     Comment 2 Documented in Chart    GLUCOSE, CAPILLARY   Collection Time    07/04/12  6:38 AM      Result Value Range   Glucose-Capillary 218 (*) 70 - 99 mg/dL  GLUCOSE, CAPILLARY   Collection Time    07/04/12 11:55 AM      Result Value Range   Glucose-Capillary 225 (*) 70 - 99 mg/dL   Comment 1 Notify RN    GLUCOSE, CAPILLARY   Collection Time    07/04/12  5:35 PM      Result Value Range   Glucose-Capillary 216 (*) 70 - 99 mg/dL   Comment 1 Documented in Chart     Comment 2 Notify RN    GLUCOSE, CAPILLARY   Collection Time    07/04/12  9:01 PM      Result Value Range   Glucose-Capillary 209 (*) 70 - 99 mg/dL   Comment 1 Notify RN    GLUCOSE, CAPILLARY   Collection Time    07/05/12  6:21 AM      Result Value Range   Glucose-Capillary 186 (*) 70 - 99 mg/dL  GLUCOSE, CAPILLARY   Collection Time    07/05/12 12:09 PM      Result Value Range   Glucose-Capillary 183 (*) 70 - 99 mg/dL  GLUCOSE, CAPILLARY   Collection Time    07/05/12  5:13 PM      Result Value Range   Glucose-Capillary 267 (*) 70 - 99 mg/dL   Comment 1 Documented in Chart     Comment 2 Notify RN    GLUCOSE, CAPILLARY   Collection Time    07/05/12  9:31 PM      Result Value Range   Glucose-Capillary 158 (*) 70 - 99 mg/dL  GLUCOSE, CAPILLARY   Collection Time    07/06/12  6:54 AM      Result Value Range   Glucose-Capillary 114 (*) 70 - 99 mg/dL  GLUCOSE, CAPILLARY   Collection Time    07/06/12 11:58 AM      Result Value Range   Glucose-Capillary 204 (*) 70 - 99 mg/dL   Comment 1 Notify RN    GLUCOSE, CAPILLARY   Collection Time    07/06/12  5:15 PM       Result Value Range  Glucose-Capillary 133 (*) 70 - 99 mg/dL  Results for orders placed during the hospital encounter of 07/02/12 (from the past 2016 hour(s))  CBC   Collection Time    07/02/12  5:25 PM      Result Value Range   WBC 6.5  4.0 - 10.5 K/uL   RBC 5.04  4.22 - 5.81 MIL/uL   Hemoglobin 14.6  13.0 - 17.0 g/dL   HCT 09.8  11.9 - 14.7 %   MCV 82.5  78.0 - 100.0 fL   MCH 29.0  26.0 - 34.0 pg   MCHC 35.1  30.0 - 36.0 g/dL   RDW 82.9  56.2 - 13.0 %   Platelets 145 (*) 150 - 400 K/uL  COMPREHENSIVE METABOLIC PANEL   Collection Time    07/02/12  5:25 PM      Result Value Range   Sodium 135  135 - 145 mEq/L   Potassium 4.3  3.5 - 5.1 mEq/L   Chloride 97  96 - 112 mEq/L   CO2 23  19 - 32 mEq/L   Glucose, Bld 348 (*) 70 - 99 mg/dL   BUN 16  6 - 23 mg/dL   Creatinine, Ser 8.65  0.50 - 1.35 mg/dL   Calcium 9.7  8.4 - 78.4 mg/dL   Total Protein 8.1  6.0 - 8.3 g/dL   Albumin 4.1  3.5 - 5.2 g/dL   AST 18  0 - 37 U/L   ALT 28  0 - 53 U/L   Alkaline Phosphatase 86  39 - 117 U/L   Total Bilirubin 0.3  0.3 - 1.2 mg/dL   GFR calc non Af Amer >90  >90 mL/min   GFR calc Af Amer >90  >90 mL/min  ETHANOL   Collection Time    07/02/12  5:25 PM      Result Value Range   Alcohol, Ethyl (B) <11  0 - 11 mg/dL  URINE RAPID DRUG SCREEN (HOSP PERFORMED)   Collection Time    07/02/12  6:14 PM      Result Value Range   Opiates NONE DETECTED  NONE DETECTED   Cocaine NONE DETECTED  NONE DETECTED   Benzodiazepines NONE DETECTED  NONE DETECTED   Amphetamines NONE DETECTED  NONE DETECTED   Tetrahydrocannabinol NONE DETECTED  NONE DETECTED   Barbiturates NONE DETECTED  NONE DETECTED  GLUCOSE, CAPILLARY   Collection Time    07/02/12  8:56 PM      Result Value Range   Glucose-Capillary 267 (*) 70 - 99 mg/dL  GLUCOSE, CAPILLARY   Collection Time    07/03/12 12:28 AM      Result Value Range   Glucose-Capillary 167 (*) 70 - 99 mg/dL  GLUCOSE, CAPILLARY   Collection Time    07/03/12   2:45 PM      Result Value Range   Glucose-Capillary 310 (*) 70 - 99 mg/dL  Last ON6E was 8.5  Assessment Axis I Major depressive disorder, existential crisis Axis II deferred Axis III see medical history Axis IV moderate  Plan/Discussion: I took his vitals.  I reviewed CC, tobacco/med/surg Hx, meds effects/ side effects, problem list, therapies and responses as well as current situation/symptoms discussed options. This patient is doing very poorly. He is given up on life and doesn't care to even take a bath on a daily basis. Unfortunately he doesn't live near in the programs that would be helpful such as intensive outpatient psychiatry. He needs to get on a medication  that is more activating such as Prozac and stop the Zoloft. He agrees to see the other counselor in the office although reluctantly. I will need to get his medical records from his primary Dr. Clista Bernhardt return in four-week's See orders and pt instructions for more details.  MEDICATIONS this encounter: Meds ordered this encounter  Medications  . carbamazepine (TEGRETOL-XR) 100 MG 12 hr tablet    Sig: Take by mouth 1 twice a day    Dispense:  60 tablet    Refill:  1  . FLUoxetine (PROZAC) 20 MG capsule    Sig: Take 1 capsule (20 mg total) by mouth daily.    Dispense:  30 capsule    Refill:  2    Take in the am   Medical Decision Making Problem Points:  Established problem, stable/improving (1), Review of last therapy session (1) and Review of psycho-social stressors (1) Data Points:  Review or order clinical lab tests (1) Review of medication regiment & side effects (2)   I certify that outpatient services furnished can reasonably be expected to improve the patient's condition.   Diannia Ruder, MD

## 2012-10-06 ENCOUNTER — Encounter (HOSPITAL_COMMUNITY): Payer: Self-pay | Admitting: Psychiatry

## 2012-10-06 ENCOUNTER — Ambulatory Visit (INDEPENDENT_AMBULATORY_CARE_PROVIDER_SITE_OTHER): Payer: BC Managed Care – PPO | Admitting: Psychiatry

## 2012-10-06 VITALS — BP 140/80 | Ht 69.0 in | Wt 169.0 lb

## 2012-10-06 DIAGNOSIS — F329 Major depressive disorder, single episode, unspecified: Secondary | ICD-10-CM

## 2012-10-06 DIAGNOSIS — F332 Major depressive disorder, recurrent severe without psychotic features: Secondary | ICD-10-CM

## 2012-10-06 MED ORDER — FLUOXETINE HCL 20 MG PO CAPS: 20.0000 mg | ORAL_CAPSULE | Freq: Every day | ORAL | Status: DC

## 2012-10-06 MED ORDER — CARBAMAZEPINE ER 100 MG PO TB12: ORAL_TABLET | ORAL | Status: DC

## 2012-10-06 NOTE — Patient Instructions (Addendum)
Abilify 5 mg at bedtime

## 2012-10-06 NOTE — Progress Notes (Signed)
Patient ID: Chad Grant, male   DOB: [birth year], 62 y.o.   MRN: [id number] Patient ID: Chad Grant, male   DOB: [birth year], 62 y.o.   MRN: [id number] [facility] [id number] Progress Note Chad Grant MRN: [id number] DOB: 1950/12/18 Age: 62 y.o.  Date: 10/06/2012 Start Time: 3:00 PM End Time: 3:25 PM  Chief Complaint: Chief Complaint  Patient presents with  . Depression  . Fatigue  . Medication Refill  . Follow-up   Subjective: "I'm still not sleeping."  This patient is a 62 year old married black male who lives with his wife in [place]. He has 2 children who are grown. 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  Apparently when the patient was in better health and used to work he was a happy go lucky person according to his wife. Since she's had the amputation he has gone downhill. In fact he was hospitalized in June for severe depression. He's been on numerous medications in the past. He used to be angry and aggressive but now seems blunted and shut down. He has no interest in life. He sleeps all day or watches TV. He refuses to bathe which is a big problem for his wife. He is very defensive and gets angry when I questioned his wife about his status, almost to the point of paranoia. He claims he can't sleep but his wife reports that he sleeps through the day and not at night. He denies any pain.  Since his last visit a month ago seems to be doing a little bit better now that he is on Prozac. He still doesn't do a whole lot during the day but he is smiling more and doesn't seem as negative. He still claims she's not sleeping but his wife mentions that he lays around all day and therefore he is not tired at night. We discussed things that he still can do despite the amputation such as walking, going to a senior center program or [facility]. He is taking better care of his blood sugar and his numbers are improving. We  discussed the addition of low-dose Abilify to help with mood and sleep but warned that it could increase appetite   Tegretol XR 100 mg twice a day Prozac 20 mg every morning  Psychiatric history Patient has been seeing in this office since 2009.  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  Since then patient has been notice very depressed and isolated.    In the past he had tried Depakote Celexa Xanax temazepam Cymbalta nortriptyline Ritalin however patient either due to side effects or had a poor response with these medication.  Vitals: BP 140/80  Ht 5\' 9"  (1.753 m)  Wt 169 lb (76.658 kg)  BMI 24.95 kg/m2  Allergies: No Known Allergies Medical History: Past Medical History  Diagnosis Date  . Diabetes mellitus type II   . Hypertension   . Stroke   . Depression   . S/P BKA (below knee amputation) unilateral   Patient has history of CVA with weakness on one side. Patient has diabetes, hypertension and hyperlipidemia. Patient sees Dr. Sherryll Burger in [place].  His last hemoglobin A1c is 8.5.  Surgical History: Past Surgical History  Procedure Laterality Date  . Left leg amputa    . Left leg amputated    . Shoulder surgery    . Cataract removed  Family History: family history includes Alcohol abuse in his brother. There is no history of Anxiety disorder, Bipolar disorder, Dementia, Depression, Drug abuse, Paranoid behavior, Schizophrenia, OCD, Seizures, Sexual abuse, Physical abuse, or ADD / ADHD. Reviewed in office today and nothing has changed.  Mental status examination Patient is casually dressed and fairly groomed. He maintained fair eye contact. He described his mood as low. His speech is slow but more spontaneous and before any action he smiled a few times throughout the interview.  He denies any paranoid thinking or any delusions and he was less defensive today compared to last time. He denies any active or passive suicidal thinking and homicidal  thinking. His attention and concentration is fair.  He denies any auditory or visual hallucination.  There were no delusion present.  He is difficult to engage in conversation.  He is alert and oriented x3. His insight judgmen are poor and he is unable to care for himself t and impulse control is okay.  Lab Results:  No results found for this or any previous visit (from the past 2016 hour(s)).Last HA1c was 8.5  Assessment Axis I Major depressive disorder, existential crisis Axis II deferred Axis III see medical history Axis IV moderate  Plan/Discussion: I took his vitals.  I reviewed CC, tobacco/med/surg Hx, meds effects/ side effects, problem list, therapies and responses as well as current situation/symptoms discussed options. The patient is doing a little bit better. Obviously given his long history of decline it will take quite a while for him to improve. He is willing to begin thinking about doing more in his life. He'll be given samples of Abilify 2 mg to try bedtime for 2 weeks and then increase to 5 mg for 2 weeks and then return. See orders and pt instructions for more details.  MEDICATIONS this encounter: Meds ordered this encounter  Medications  . carbamazepine (TEGRETOL-XR) 100 MG 12 hr tablet    Sig: Take by mouth 1 twice a day    Dispense:  60 tablet    Refill:  2  . FLUoxetine (PROZAC) 20 MG capsule    Sig: Take 1 capsule (20 mg total) by mouth daily.    Dispense:  30 capsule    Refill:  2    Take in the am   Medical Decision Making Problem Points:  Established problem, stable/improving (1), Review of last therapy session (1) and Review of psycho-social stressors (1) Data Points:  Review or order clinical lab tests (1) Review of medication regiment & side effects (2)   I certify that outpatient services furnished can reasonably be expected to improve the patient's condition.   Diannia Ruder, MD

## 2012-10-07 ENCOUNTER — Ambulatory Visit (HOSPITAL_COMMUNITY): Payer: Self-pay | Admitting: Psychiatry

## 2012-11-05 ENCOUNTER — Encounter (HOSPITAL_COMMUNITY): Payer: Self-pay | Admitting: Psychiatry

## 2012-11-05 ENCOUNTER — Ambulatory Visit (INDEPENDENT_AMBULATORY_CARE_PROVIDER_SITE_OTHER): Payer: BC Managed Care – PPO | Admitting: Psychiatry

## 2012-11-05 VITALS — BP 130/80 | Ht 69.0 in | Wt 161.0 lb

## 2012-11-05 DIAGNOSIS — F329 Major depressive disorder, single episode, unspecified: Secondary | ICD-10-CM

## 2012-11-05 MED ORDER — CARBAMAZEPINE ER 100 MG PO TB12: ORAL_TABLET | ORAL | Status: DC

## 2012-11-05 MED ORDER — FLUOXETINE HCL 20 MG PO CAPS: 40.0000 mg | ORAL_CAPSULE | Freq: Every day | ORAL | Status: DC

## 2012-11-05 NOTE — Progress Notes (Signed)
Patient ID: Chad Grant, male   DOB: 1950/07/13, 62 y.o.   MRN: [id number] Patient ID: Chad Grant, male   DOB: [birth year], 62 y.o.   MRN: [id number] Patient ID: Chad Grant, male   DOB: [birth year], 62 y.o.   MRN: [id number] [facility] [id number] Progress Note Chad Grant MRN: [id number] DOB: 1950-03-21 Age: 62 y.o.  Date: 11/05/2012 Start Time: 3:00 PM End Time: 3:25 PM  Chief Complaint: Chief Complaint  Patient presents with  . Anxiety  . Depression  . Follow-up   Subjective: "I'm still not sleeping."  This patient is a 62 year old married black male who lives with his wife in [place]. He has 2 children who are grown. 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  Apparently when the patient was in better health and used to work he was a happy go lucky person according to his wife. Since she's had the amputation he has gone downhill. In fact he was hospitalized in June for severe depression. He's been on numerous medications in the past. He used to be angry and aggressive but now seems blunted and shut down. He has no interest in life. He sleeps all day or watches TV. He refuses to bathe which is a big problem for his wife. He is very defensive and gets angry when I questioned his wife about his status, almost to the point of paranoia. He claims he can't sleep but his wife reports that he sleeps through the day and not at night. He denies any pain. The patient is wife return after four-week's. Last time we added Abilify to his regimen. His wife is not sure it has helped his mood but is increasing his appetite and his craving a lot of sweets. This is not good for his blood sugar so we probably need to discontinue it. He refuses to use his insulin but will take his other medications. Consequently his blood sugar is  all of the map. His wife states that he's been urinating in cups and in the sink. I'm not sure if this is  because of his stroke or because he simply angry with his wife. He still making very little effort to get out. His wife took him to his mother's house and as soon as he got there he wanted to go home. He still spends most of his time laying around.  Tegretol XR 100 mg twice a day Prozac 20 mg every morning  Psychiatric history Patient has been seeing in this office since 2009.  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  Since then patient has been notice very depressed and isolated.    In the past he had tried Depakote Celexa Xanax temazepam Cymbalta nortriptyline Ritalin however patient either due to side effects or had a poor response with these medication.  Vitals: BP 130/80  Ht 5\' 9"  (1.753 m)  Wt 161 lb (73.029 kg)  BMI 23.76 kg/m2  Allergies: No Known Allergies Medical History: Past Medical History  Diagnosis Date  . Diabetes mellitus type II   . Hypertension   . Stroke   . Depression   . S/P BKA (below knee amputation) unilateral   Patient has history of CVA with weakness on one side. Patient has diabetes, hypertension and hyperlipidemia. Patient sees Dr. Sherryll Burger in [place].  His last hemoglobin A1c is 8.5.  Surgical History: Past Surgical History  Procedure Laterality Date  . Left leg amputa    . Left leg amputated    . Shoulder surgery    . Cataract removed     Family History: family history includes Alcohol abuse in his brother. There is no history of Anxiety disorder, Bipolar disorder, Dementia, Depression, Drug abuse, Paranoid behavior, Schizophrenia, OCD, Seizures, Sexual abuse, Physical abuse, or ADD / ADHD. Reviewed in office today and nothing has changed.  Mental status examination Patient is casually dressed and fairly groomed. He maintained fair eye contact. He described his mood as low. His speech is slow   He denies any paranoid thinking or any delusions and he was less defensive today compared to last time. He denies any active or  passive suicidal thinking and homicidal thinking. His attention and concentration is fair.  He denies any auditory or visual hallucination.  There were no delusion present.  He is difficult to engage in conversation.  He is alert and oriented x3. His insight judgmen are poor and he is unable to care for himself t and impulse control is okay.  Lab Results:  No results found for this or any previous visit (from the past 2016 hour(s)).Last HA1c was 8.5  Assessment Axis I Major depressive disorder, existential crisis Axis II deferred Axis III see medical history Axis IV moderate  Plan/Discussion: I took his vitals.  I reviewed CC, tobacco/med/surg Hx, meds effects/ side effects, problem list, therapies and responses as well as current situation/symptoms discussed options. The patient is doing a little bit better. Obviously given his long history of decline it will take quite a while for him to improve. He is willing to begin thinking about doing more in his life. Unfortunately his wife states that as soon as he gets home he does what he wants to do which is to just lay in the bed. We'll try increasing Prozac to 40 mg per day and continuing Tegretol. Abilify will be discontinued as it has not helped it caused increase in appetite particular for carbohydrates. He'll be scheduled for counseling here with Sudie Bailey and will return to see me in 4 weeks See orders and pt instructions for more details.  MEDICATIONS this encounter: Meds ordered this encounter  Medications  . FLUoxetine (PROZAC) 20 MG capsule    Sig: Take 2 capsules (40 mg total) by mouth daily.    Dispense:  30 capsule    Refill:  2    Take in the am  . carbamazepine (TEGRETOL-XR) 100 MG 12 hr tablet    Sig: Take by mouth 1 twice a day    Dispense:  60 tablet    Refill:  2   Medical Decision Making Problem Points:  Established problem, stable/improving (1), Review of last therapy session (1) and Review of psycho-social  stressors (1) Data Points:  Review or order clinical lab tests (1) Review of medication regiment & side effects (2)   I certify that outpatient services furnished can reasonably be expected to improve the patient's condition.   Diannia Ruder, MD

## 2012-11-26 ENCOUNTER — Other Ambulatory Visit: Payer: Self-pay

## 2012-11-30 ENCOUNTER — Ambulatory Visit (INDEPENDENT_AMBULATORY_CARE_PROVIDER_SITE_OTHER): Payer: BC Managed Care – PPO | Admitting: Psychology

## 2012-11-30 ENCOUNTER — Other Ambulatory Visit (HOSPITAL_COMMUNITY): Payer: Self-pay | Admitting: Psychiatry

## 2012-11-30 DIAGNOSIS — F0630 Mood disorder due to known physiological condition, unspecified: Secondary | ICD-10-CM

## 2012-11-30 DIAGNOSIS — F09 Unspecified mental disorder due to known physiological condition: Secondary | ICD-10-CM

## 2012-11-30 MED ORDER — FLUOXETINE HCL 20 MG PO CAPS: 40.0000 mg | ORAL_CAPSULE | Freq: Every day | ORAL | Status: DC

## 2012-12-01 ENCOUNTER — Encounter (HOSPITAL_COMMUNITY): Payer: Self-pay | Admitting: Psychology
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
## 2012-12-10 ENCOUNTER — Ambulatory Visit (HOSPITAL_COMMUNITY): Payer: Self-pay | Admitting: Psychiatry

## 2012-12-22 ENCOUNTER — Encounter (HOSPITAL_COMMUNITY): Payer: Self-pay | Admitting: Psychology

## 2012-12-22 ENCOUNTER — Ambulatory Visit (INDEPENDENT_AMBULATORY_CARE_PROVIDER_SITE_OTHER): Payer: BC Managed Care – PPO | Admitting: Psychology

## 2012-12-22 DIAGNOSIS — F0630 Mood disorder due to known physiological condition, unspecified: Secondary | ICD-10-CM

## 2012-12-22 DIAGNOSIS — F09 Unspecified mental disorder due to known physiological condition: Secondary | ICD-10-CM
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
## 2012-12-24 ENCOUNTER — Ambulatory Visit (HOSPITAL_COMMUNITY): Payer: Self-pay | Admitting: Psychiatry

## 2013-01-05 ENCOUNTER — Ambulatory Visit (INDEPENDENT_AMBULATORY_CARE_PROVIDER_SITE_OTHER): Payer: BC Managed Care – PPO | Admitting: Psychiatry

## 2013-01-05 ENCOUNTER — Encounter (HOSPITAL_COMMUNITY): Payer: Self-pay | Admitting: Psychiatry

## 2013-01-05 VITALS — BP 130/74 | Ht 69.0 in | Wt 164.0 lb

## 2013-01-05 DIAGNOSIS — F09 Unspecified mental disorder due to known physiological condition: Secondary | ICD-10-CM

## 2013-01-05 DIAGNOSIS — F329 Major depressive disorder, single episode, unspecified: Secondary | ICD-10-CM

## 2013-01-05 MED ORDER — FLUOXETINE HCL 20 MG PO CAPS: 40.0000 mg | ORAL_CAPSULE | Freq: Every day | ORAL | Status: DC

## 2013-01-05 MED ORDER — CARBAMAZEPINE ER 100 MG PO TB12: ORAL_TABLET | ORAL | Status: DC

## 2013-01-05 MED ORDER — DONEPEZIL HCL 5 MG PO TABS: 5.0000 mg | ORAL_TABLET | Freq: Every day | ORAL | Status: DC

## 2013-01-05 NOTE — Progress Notes (Signed)
Patient ID: Chad Grant, male   DOB: [birth year], 62 y.o.   MRN: [id number] Patient ID: Chad Grant, male   DOB: Sep 08, 1950, 62 y.o.   MRN: [id number] Patient ID: Chad Grant, male   DOB: 1950/03/12, 62 y.o.   MRN: [id number] Patient ID: Chad Grant, male   DOB: 1950/10/24, 62 y.o.   MRN: [id number] [facility] [id number] Progress Note Chad Grant MRN: [id number] DOB: 06-Aug-1950 Age: 62 y.o.  Date: 01/05/2013 Start Time: 3:00 PM End Time: 3:25 PM  Chief Complaint: Chief Complaint  Patient presents with  . Anxiety  . Depression  . Follow-up   Subjective: "I'm still not sleeping."  This patient is a 62 year old married black male who lives with his wife in [place]. He has 2 children who are grown. 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  Apparently when the patient was in better health and used to work he was a happy go lucky person according to his wife. Since she's had the amputation he has gone downhill. In fact he was hospitalized in June for severe depression. He's been on numerous medications in the past. He used to be angry and aggressive but now seems blunted and shut down. He has no interest in life. He sleeps all day or watches TV. He refuses to bathe which is a big problem for his wife. He is very defensive and gets angry when I questioned his wife about his status, almost to the point of paranoia. He claims he can't sleep but his wife reports that he sleeps through the day and not at night. He denies any pain. The patient is wife return after four-week's. Last time we added Abilify to his regimen. His wife is not sure it has helped his mood but is increasing his appetite and his craving a lot of sweets. This is not good for his blood sugar so we probably need to discontinue it. He refuses to use his insulin but will take his other medications. Consequently his blood sugar is  all of the map. His wife states  that he's been urinating in cups and in the sink. I'm not sure if this is because of his stroke or because he simply angry with his wife. He still making very little effort to get out. His wife took him to his mother's house and as soon as he got there he wanted to go home. He still spends most of his time laying around.  The patient returns with his wife after 2 months. He is doing slightly better. He's no longer urinating in cups and is trying to improve his hygiene a little bit. He claims he is nothing to do all day. We talked about the idea of going to a senior center program and he actually seemed enthusiastic. His wife will check into it after the holidays. He has a lot of memory problems and while his wife was gone per week he forgot to take his medications despite being reminded by other family members and having the pills and an Dance movement psychotherapist. He is to take Aricept that might be good to return to this again  Tegretol XR 100 mg twice a day Prozac 20 mg every morning  Psychiatric history Patient has been seeing in this office since 2009.  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  Since then patient has been notice very depressed  and isolated.    In the past he had tried Depakote Celexa Xanax temazepam Cymbalta nortriptyline Ritalin however patient either due to side effects or had a poor response with these medication.  Vitals: BP 130/74  Ht 5\' 9"  (1.753 m)  Wt 164 lb (74.39 kg)  BMI 24.21 kg/m2  Allergies: No Known Allergies Medical History: Past Medical History  Diagnosis Date  . Diabetes mellitus type II   . Hypertension   . Stroke   . Depression   . S/P BKA (below knee amputation) unilateral   Patient has history of CVA with weakness on one side. Patient has diabetes, hypertension and hyperlipidemia. Patient sees Dr. Sherryll Burger in [place].  His last hemoglobin A1c is 8.5.  Surgical History: Past Surgical History  Procedure Laterality Date  . Left leg amputa     . Left leg amputated    . Shoulder surgery    . Cataract removed     Family History: family history includes Alcohol abuse in his brother. There is no history of Anxiety disorder, Bipolar disorder, Dementia, Depression, Drug abuse, Paranoid behavior, Schizophrenia, OCD, Seizures, Sexual abuse, Physical abuse, or ADD / ADHD. Reviewed in office today and nothing has changed.  Mental status examination Patient is casually dressed and fairly groomed. He maintained fair eye contact. He described his mood as low. His speech is slow   He denies any paranoid thinking or any delusions and he was less defensive today compared to last time. He denies any active or passive suicidal thinking and homicidal thinking. His attention and concentration is fair.  He denies any auditory or visual hallucination.  There were no delusion present.  He is difficult to engage in conversation.  He is alert and oriented x3. His insight judgmen are poor and he is unable to care for himself t and impulse control is okay.  Lab Results:  No results found for this or any previous visit (from the past 2016 hour(s)).Last HA1c was 8.5  Assessment Axis I Major depressive disorder, existential crisis Axis II deferred Axis III see medical history Axis IV moderate  Plan/Discussion: I took his vitals.  I reviewed CC, tobacco/med/surg Hx, meds effects/ side effects, problem list, therapies and responses as well as current situation/symptoms discussed options. The patient is doing a little bit better. Obviously given his long history of decline it will take quite a while for him to improve. He is willing to begin thinking about doing more in his life. He will continue Tegretol and Prozac. He will start Aricept 5 mg each bedtime. He'll be scheduled for counseling here with Sudie Bailey and will return to see me in 2 months See orders and pt instructions for more details.  MEDICATIONS this encounter: Meds ordered this encounter   Medications  . carbamazepine (TEGRETOL-XR) 100 MG 12 hr tablet    Sig: Take by mouth 1 twice a day    Dispense:  60 tablet    Refill:  2  . FLUoxetine (PROZAC) 20 MG capsule    Sig: Take 2 capsules (40 mg total) by mouth daily.    Dispense:  60 capsule    Refill:  2    Take in the am  . donepezil (ARICEPT) 5 MG tablet    Sig: Take 1 tablet (5 mg total) by mouth at bedtime.    Dispense:  30 tablet    Refill:  2   Medical Decision Making Problem Points:  Established problem, stable/improving (1), Review of last therapy session (1)  and Review of psycho-social stressors (1) Data Points:  Review or order clinical lab tests (1) Review of medication regiment & side effects (2)   I certify that outpatient services furnished can reasonably be expected to improve the patient's condition.   Diannia Ruder, MD

## 2013-01-26 ENCOUNTER — Ambulatory Visit (INDEPENDENT_AMBULATORY_CARE_PROVIDER_SITE_OTHER): Payer: BC Managed Care – PPO | Admitting: Psychology

## 2013-01-26 DIAGNOSIS — F3289 Other specified depressive episodes: Secondary | ICD-10-CM

## 2013-01-26 DIAGNOSIS — F09 Unspecified mental disorder due to known physiological condition: Secondary | ICD-10-CM

## 2013-01-26 DIAGNOSIS — F329 Major depressive disorder, single episode, unspecified: Secondary | ICD-10-CM

## 2013-01-26 DIAGNOSIS — F32A Depression, unspecified: Secondary | ICD-10-CM

## 2013-01-28 ENCOUNTER — Encounter (HOSPITAL_COMMUNITY): Payer: Self-pay | Admitting: Psychology

## 2013-01-28 NOTE — Progress Notes (Signed)
Patient:  Chad Grant   DOB: 31-May-1950  MR Number: [id number]  Location: BEHAVIORAL [facility] [address] [place] [address] Dept: [phone]  Start: 2 PM End: 3 PM  Provider/Observer:     Hershal Coria PSYD  Chief Complaint:      Chief Complaint  Patient presents with  . Depression  . Memory Loss    Reason For Service:     the patient was referred for individual psychotherapy in conjunction with the ongoing psychiatry care that he is taking. The patient has severe diabetes and lost his leg a couple of years ago and has not been coping well with this. He has that was significant suicidal ideation as well. His wife is very upset with him as he has not been taking care of himself the way he needs to. Prior to this the patient was a very heavy drinker and partying man who hated to go to doctors and hated to get help. He even had the issue he was having with his leg right to the point where he had to go to the hospital and have it amputated. The patient has isolated himself at the house even when his employer would call him every day. The patient then had a stroke on January 09, 2009 and this is exacerbated his anger and lethargy. This stroke sounds like it was in the right front part of the brain. He continues to get very angry when asked to bathe or wash his hands and also experiences significant sequential memory deficits.  The patient has increasingly refused to engage in self-care behaviors and his wife is at a point where she was overwhelmed. We may have to look at the possibility of assisted living care if the patient is not able to engage more.  Interventions Strategy:  Cognitive/behavioral psychotherapy  Participation Level:   Active  Participation Quality:  Resistant      Behavioral Observation:  Guarded, Lethargic, and Blunt.   Current Psychosocial Factors: The patient reports that he has  been getting out of the house with his brother at least once per week since I last saw him.  His wife reports that the patient has been doing better getting bathed without conflict and has been doing better overall but still issues.  Content of Session:   Review current symptoms and continued work on therapeutic interventions.  Current Status:   The patient reports that he has not been sleeping well lately and would like to try [place] again.  We talked about other ways to get his sleep improved including getting up in AM and staying out of bed during the day.  Patient Progress:   The patient is been doing very poorly and very little interventions of actually help.  Target Goals:     Last Reviewed:   1/6/20015  Goals Addressed Today:    Today we worked on basic expectations.  Impression/Diagnosis:   Prior to the stroke the patient was a heavy drinker and spent a lot of time going out. The patient has severe diabetes and avoided talking to his wife about that even to the point where his leg was amputated and she did not find that he was having troubles with his leg until the date was amputated. The patient had a stroke in December of 2010 that affected the right front part of his brain and the patient has not only behavioral changes as a result of the stroke but  also has difficulty sequencing events and understanding the timeline of events.  Diagnosis:    Axis I: Cognitive disorder  Depression

## 2013-03-02 ENCOUNTER — Ambulatory Visit (INDEPENDENT_AMBULATORY_CARE_PROVIDER_SITE_OTHER): Payer: BC Managed Care – PPO | Admitting: Psychology

## 2013-03-02 DIAGNOSIS — F329 Major depressive disorder, single episode, unspecified: Secondary | ICD-10-CM

## 2013-03-02 DIAGNOSIS — F3289 Other specified depressive episodes: Secondary | ICD-10-CM

## 2013-03-02 DIAGNOSIS — F32A Depression, unspecified: Secondary | ICD-10-CM

## 2013-03-02 DIAGNOSIS — F09 Unspecified mental disorder due to known physiological condition: Secondary | ICD-10-CM

## 2013-03-08 ENCOUNTER — Ambulatory Visit (HOSPITAL_COMMUNITY): Payer: Self-pay | Admitting: Psychiatry

## 2013-03-09 ENCOUNTER — Ambulatory Visit (HOSPITAL_COMMUNITY): Payer: Self-pay | Admitting: Psychiatry

## 2013-03-10 ENCOUNTER — Encounter (HOSPITAL_COMMUNITY): Payer: Self-pay | Admitting: Psychology

## 2013-03-10 NOTE — Progress Notes (Signed)
Patient:  Chad Grant   DOB: 1950-07-16  MR Number: [id number]  Location: BEHAVIORAL [facility] [address] [place] [address] Dept: [phone]  Start: 10 AM End: 11 AM  Provider/Observer:     Hershal Coria PSYD  Chief Complaint:      Chief Complaint  Patient presents with  . Depression  . Anxiety    Reason For Service:     the patient was referred for individual psychotherapy in conjunction with the ongoing psychiatry care that he is taking. The patient has severe diabetes and lost his leg a couple of years ago and has not been coping well with this. He has that was significant suicidal ideation as well. His wife is very upset with him as he has not been taking care of himself the way he needs to. Prior to this the patient was a very heavy drinker and partying man who hated to go to doctors and hated to get help. He even had the issue he was having with his leg right to the point where he had to go to the hospital and have it amputated. The patient has isolated himself at the house even when his employer would call him every day. The patient then had a stroke on January 09, 2009 and this is exacerbated his anger and lethargy. This stroke sounds like it was in the right front part of the brain. He continues to get very angry when asked to bathe or wash his hands and also experiences significant sequential memory deficits.  The patient has increasingly refused to engage in self-care behaviors and his wife is at a point where she was overwhelmed. We may have to look at the possibility of assisted living care if the patient is not able to engage more.  Interventions Strategy:  Cognitive/behavioral psychotherapy  Participation Level:   Active  Participation Quality:  Resistant      Behavioral Observation:  Guarded, Lethargic, and Blunt.   Current Psychosocial Factors: The patient initially reports that  he has been doing well and doing all of the activities that were laid out for him. However, in talking with his wife she reports that he is actually not been keeping up with his personal hygiene like he had been for a brief period time last month. She reports that she had to have her son come in and essentially made her husband taking a bath. The patient denies being aware that when he visited his family they all commented on his body odor and wanted to avoid him.  Content of Session:   Review current symptoms and continued work on therapeutic interventions.  Current Status:   The patient reports that he is continuing to have difficulty motivating himself to maintain personal hygiene and clearly describes a situation where he has difficulty being aware of an attending to his hygiene issues.  Patient Progress:   The patient is been doing very poorly and very little interventions of actually help.  Target Goals:     Last Reviewed:   2/10/20015  Goals Addressed Today:    Today we worked on basic expectations.  Impression/Diagnosis:   Prior to the stroke the patient was a heavy drinker and spent a lot of time going out. The patient has severe diabetes and avoided talking to his wife about that even to the point where his leg was amputated and she did not find that he was having troubles with his leg until  the date was amputated. The patient had a stroke in December of 2010 that affected the right front part of his brain and the patient has not only behavioral changes as a result of the stroke but also has difficulty sequencing events and understanding the timeline of events.  Diagnosis:    Axis I: Cognitive disorder  Depression

## 2013-03-17 ENCOUNTER — Ambulatory Visit (HOSPITAL_COMMUNITY): Payer: Self-pay | Admitting: Psychiatry

## 2013-03-22 ENCOUNTER — Encounter (HOSPITAL_COMMUNITY): Payer: Self-pay | Admitting: Psychiatry

## 2013-03-22 ENCOUNTER — Ambulatory Visit (INDEPENDENT_AMBULATORY_CARE_PROVIDER_SITE_OTHER): Payer: BC Managed Care – PPO | Admitting: Psychiatry

## 2013-03-22 VITALS — BP 110/70 | Ht 69.0 in | Wt 160.0 lb

## 2013-03-22 DIAGNOSIS — F32A Depression, unspecified: Secondary | ICD-10-CM

## 2013-03-22 DIAGNOSIS — F329 Major depressive disorder, single episode, unspecified: Secondary | ICD-10-CM

## 2013-03-22 DIAGNOSIS — F09 Unspecified mental disorder due to known physiological condition: Secondary | ICD-10-CM

## 2013-03-22 MED ORDER — FLUOXETINE HCL 20 MG PO CAPS: 40.0000 mg | ORAL_CAPSULE | Freq: Every day | ORAL | Status: DC

## 2013-03-22 MED ORDER — ARIPIPRAZOLE 2 MG PO TABS: 2.0000 mg | ORAL_TABLET | Freq: Every day | ORAL | Status: DC

## 2013-03-22 MED ORDER — CARBAMAZEPINE ER 100 MG PO TB12: ORAL_TABLET | ORAL | Status: DC

## 2013-03-22 MED ORDER — DONEPEZIL HCL 5 MG PO TABS: 5.0000 mg | ORAL_TABLET | Freq: Every day | ORAL | Status: DC

## 2013-03-22 NOTE — Progress Notes (Signed)
Patient ID: Chad Grant, male   DOB: 1950/02/11, 63 y.o.   MRN: [id number] Patient ID: Chad Grant, male   DOB: August 25, 1950, 63 y.o.   MRN: [id number] Patient ID: Chad Grant, male   DOB: [birth year], 63 y.o.   MRN: [id number] Patient ID: Chad Grant, male   DOB: 05-May-1950, 63 y.o.   MRN: [id number] Patient ID: Chad Grant, male   DOB: 10-Oct-1950, 63 y.o.   MRN: [id number] [facility] [address] Progress Note Chad Grant MRN: [id number] DOB: [birth year] Age: 63 y.o.  Date: 03/22/2013 Start Time: 3:00 PM End Time: 3:25 PM  Chief Complaint: Chief Complaint  Patient presents with  . Depression  . Follow-up   Subjective: "I'm still not sleeping."  This patient is a 63 year old married black male who lives with his wife in [place]. He has 2 children who are grown. 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  Apparently when the patient was in better health and used to work he was a happy go lucky person according to his wife. Since she's had the amputation he has gone downhill. In fact he was hospitalized in June for severe depression. He's been on numerous medications in the past. He used to be angry and aggressive but now seems blunted and shut down. He has no interest in life. He sleeps all day or watches TV. He refuses to bathe which is a big problem for his wife. He is very defensive and gets angry when I questioned his wife about his status, almost to the point of paranoia. He claims he can't sleep but his wife reports that he sleeps through the day and not at night. He denies any pain. The patient is wife return after four-week's. Last time we added Abilify to his regimen. His wife is not sure it has helped his mood but is increasing his appetite and his craving a lot of sweets. This is not good for his blood sugar so we probably need to discontinue it. He refuses to use his insulin but will take his other medications.  Consequently his blood sugar is  all of the map. His wife states that he's been urinating in cups and in the sink. I'm not sure if this is because of his stroke or because he simply angry with his wife. He still making very little effort to get out. His wife took him to his mother's house and as soon as he got there he wanted to go home. He still spends most of his time laying around.  The patient returns with his wife after 2 months. He is doing slightly better. His memory is better with Aricept. He still exhibits poor hygiene and doesn't always whipe after he goes to the bathroom. Still hard for him to get out much. His wife thinks he did better on Abilify even though it increased his blood sugar. She is now watching his food much more carefully and she would like to try it again because it improved his mood and energy Tegretol XR 100 mg twice a day Prozac 20 mg every morning  Psychiatric history Patient has been seeing in this office since 2009.  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  Since then patient has been notice very depressed and isolated.    In the past he had tried Depakote Celexa Xanax temazepam Cymbalta nortriptyline Ritalin however patient either  due to side effects or had a poor response with these medication.  Vitals: BP 110/70  Ht 5\' 9"  (1.753 m)  Wt 160 lb (72.576 kg)  BMI 23.62 kg/m2  Allergies: No Known Allergies Medical History: Past Medical History  Diagnosis Date  . Diabetes mellitus type II   . Hypertension   . Stroke   . Depression   . S/P BKA (below knee amputation) unilateral   Patient has history of CVA with weakness on one side. Patient has diabetes, hypertension and hyperlipidemia. Patient sees Dr. Sherryll [place]Shah in [place].  His last hemoglobin A1c is 8.5.  Surgical History: Past Surgical History  Procedure Laterality Date  . Left leg amputa    . Left leg amputated    . Shoulder surgery    . Cataract removed     Family  History: family history includes Alcohol abuse in his brother. There is no history of Anxiety disorder, Bipolar disorder, Dementia, Depression, Drug abuse, Paranoid behavior, Schizophrenia, OCD, Seizures, Sexual abuse, Physical abuse, or ADD / ADHD. Reviewed in office today and nothing has changed.  Mental status examination Patient is casually dressed and fairly groomed. He maintained fair eye contact. He described his mood as good His speech is slow   He denies any paranoid thinking or any delusions and he was less defensive today compared to last time. He actually laughed a few times He denies any active or passive suicidal thinking and homicidal thinking. His attention and concentration is fair.  He denies any auditory or visual hallucination.  There were no delusion present.  He is difficult to engage in conversation.  He is alert and oriented x3. His insight judgmen are poor and he is unable to care for himself t and impulse control is okay.  Lab Results:  No results found for this or any previous visit (from the past 2016 hour(s)).Last HA1c was 8.5  Assessment Axis I Major depressive disorder, existential crisis Axis II deferred Axis III see medical history Axis IV moderate  Plan/Discussion: I took his vitals.  I reviewed CC, tobacco/med/surg Hx, meds effects/ side effects, problem list, therapies and responses as well as current situation/symptoms discussed options. The patient is doing a little bit better. Obviously given his long history of decline it will take quite a while for him to improve. He is willing to begin thinking about doing more in his life. He will continue Tegretol and Prozac. Andt Aricept 5 mg each bedtime. He'll he'll also restart Abilify 2 mg each bedtime and return to see me in 6 weeks See orders and pt instructions for more details.  MEDICATIONS this encounter: Meds ordered this encounter  Medications  . carbamazepine (TEGRETOL-XR) 100 MG 12 hr tablet    Sig:  Take by mouth 1 twice a day    Dispense:  60 tablet    Refill:  2  . donepezil (ARICEPT) 5 MG tablet    Sig: Take 1 tablet (5 mg total) by mouth at bedtime.    Dispense:  30 tablet    Refill:  2  . FLUoxetine (PROZAC) 20 MG capsule    Sig: Take 2 capsules (40 mg total) by mouth daily.    Dispense:  60 capsule    Refill:  2    Take in the am  . ARIPiprazole (ABILIFY) 2 MG tablet    Sig: Take 1 tablet (2 mg total) by mouth daily.    Dispense:  30 tablet    Refill:  3   Medical  Decision Making Problem Points:  Established problem, stable/improving (1), Review of last therapy session (1) and Review of psycho-social stressors (1) Data Points:  Review or order clinical lab tests (1) Review of medication regiment & side effects (2)   I certify that outpatient services furnished can reasonably be expected to improve the patient's condition.   Diannia Ruder, MD

## 2013-04-07 ENCOUNTER — Ambulatory Visit (HOSPITAL_COMMUNITY): Payer: Self-pay | Admitting: Psychology

## 2013-05-05 ENCOUNTER — Encounter (HOSPITAL_COMMUNITY): Payer: Self-pay | Admitting: Psychiatry

## 2013-05-05 ENCOUNTER — Ambulatory Visit (INDEPENDENT_AMBULATORY_CARE_PROVIDER_SITE_OTHER): Payer: BC Managed Care – PPO | Admitting: Psychiatry

## 2013-05-05 ENCOUNTER — Ambulatory Visit (HOSPITAL_COMMUNITY): Payer: Self-pay | Admitting: Psychiatry

## 2013-05-05 VITALS — BP 90/60 | Ht 69.0 in | Wt 161.0 lb

## 2013-05-05 DIAGNOSIS — F32A Depression, unspecified: Secondary | ICD-10-CM

## 2013-05-05 DIAGNOSIS — F4329 Adjustment disorder with other symptoms: Secondary | ICD-10-CM

## 2013-05-05 DIAGNOSIS — F329 Major depressive disorder, single episode, unspecified: Secondary | ICD-10-CM

## 2013-05-05 DIAGNOSIS — F09 Unspecified mental disorder due to known physiological condition: Secondary | ICD-10-CM

## 2013-05-05 MED ORDER — DONEPEZIL HCL 5 MG PO TABS: 5.0000 mg | ORAL_TABLET | Freq: Every day | ORAL | Status: DC

## 2013-05-05 MED ORDER — ARIPIPRAZOLE 2 MG PO TABS: 2.0000 mg | ORAL_TABLET | Freq: Every day | ORAL | Status: DC

## 2013-05-05 MED ORDER — FLUOXETINE HCL 20 MG PO CAPS: 40.0000 mg | ORAL_CAPSULE | Freq: Every day | ORAL | Status: DC

## 2013-05-05 MED ORDER — CARBAMAZEPINE ER 100 MG PO TB12: ORAL_TABLET | ORAL | Status: DC

## 2013-05-05 NOTE — Progress Notes (Signed)
Patient ID: Chad Grant Chad Grant, male   DOB: 1951/01/11, 63 y.o.   MRN: [id number] Patient ID: Chad Grant Chad Grant, male   DOB: 1951/01/11, 63 y.o.   MRN: [id number] Patient ID: Chad Grant Chad Grant, male   DOB: 1951/01/11, 63 y.o.   MRN: [id number] Patient ID: Chad Grant Chad Grant, male   DOB: 1951/01/11, 63 y.o.   MRN: [id number] Patient ID: Chad Grant Chad Grant, male   DOB: 1951/01/11, 63 y.o.   MRN: [id number] Patient ID: Chad Grant Chad Grant, male   DOB: 1951/01/11, 63 y.o.   MRN: [id number] [facility] [id number] Progress Note Chad Grant Chad Grant MRN: [id number] DOB: 1951/01/11 Age: 63 y.o.  Date: 05/05/2013 Start Time: 3:00 PM End Time: 3:25 PM  Chief Complaint: Chief Complaint  Patient presents with  . Anxiety  . Depression  . Follow-up   Subjective: "He won't take a shower This patient is a 63 year old married black male who lives with his wife in [place]. He has 2 children who are grown. 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  Apparently when the patient was in better health and used to work he was a happy go lucky person according to his wife. Since she's had the amputation he has gone downhill. In fact he was hospitalized in June for severe depression. He's been on numerous medications in the past. He used to be angry and aggressive but now seems blunted and shut down. He has no interest in life. He sleeps all day or watches TV. He refuses to bathe which is a big problem for his wife. He is very defensive and gets angry when I questioned his wife about his status, almost to the point of paranoia. He claims he can't sleep but his wife reports that he sleeps through the day and not at night. He denies any pain. The patient is wife return after 2 months. He still not willing to take a shower every day. His mood is variable. He admits that sometimes he is withdrawn and just doesn't want to be around people. The Abilify made him a little bit hyperactive so the wife is  only giving it to him every other day. At this point he might be a try every day. We discussed trying to get him in to the senior center in [place] which would provide him for some place to go and some activities and she is going to look into this   Psychiatric history Patient has been seeing in this office since 2009.  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  Since then patient has been notice very depressed and isolated.    In the past he had tried Depakote Celexa Xanax temazepam Cymbalta nortriptyline Ritalin however patient either due to side effects or had a poor response with these medication.  Vitals: BP 90/60  Ht 5\' 9"  (1.753 m)  Wt 161 lb (73.029 kg)  BMI 23.76 kg/m2  Allergies: No Known Allergies Medical History: Past Medical History  Diagnosis Date  . Diabetes mellitus type II   . Hypertension   . Stroke   . Depression   . S/P BKA (below knee amputation) unilateral   Patient has history of CVA with weakness on one side. Patient has diabetes, hypertension and hyperlipidemia. Patient sees Dr. Sherryll [place]Shah in [place].  His last hemoglobin A1c is 8.5.  Surgical History: Past Surgical History  Procedure Laterality Date  . Left leg amputa    .  Left leg amputated    . Shoulder surgery    . Cataract removed     Family History: family history includes Alcohol abuse in his brother. There is no history of Anxiety disorder, Bipolar disorder, Dementia, Depression, Drug abuse, Paranoid behavior, Schizophrenia, OCD, Seizures, Sexual abuse, Physical abuse, or ADD / ADHD. Reviewed in office today and nothing has changed.  Mental status examination Patient is casually dressed and fairly groomed. He maintained fair eye contact. He described his mood as up and down His speech is slow   He denies any paranoid thinking or any delusions and he was less defensive today compared to last time.  He denies any active or passive suicidal thinking and homicidal thinking. His  attention and concentration is fair.  He denies any auditory or visual hallucination.  There were no delusion present.  He is difficult to engage in conversation. He is slow to respond  He is alert and oriented x3. His insight judgmen are poor and he is unable to care for himself t and impulse control is okay.  Lab Results:  No results found for this or any previous visit (from the past 2016 hour(s)).Last HA1c was 8.5  Assessment Axis I Major depressive disorder, existential crisis Axis II deferred Axis III see medical history Axis IV moderate  Plan/Discussion: I took his vitals.  I reviewed CC, tobacco/med/surg Hx, meds effects/ side effects, problem list, therapies and responses as well as current situation/symptoms discussed options. The patient is doing about the same. He will continue Tegretol and Prozac. Andt Aricept 5 mg each bedtime.  Abilify 2 mg each bedtime and return to see me in 2 months See orders and pt instructions for more details.  MEDICATIONS this encounter: Meds ordered this encounter  Medications  . carbamazepine (TEGRETOL-XR) 100 MG 12 hr tablet    Sig: Take by mouth 1 twice a day    Dispense:  60 tablet    Refill:  2  . FLUoxetine (PROZAC) 20 MG capsule    Sig: Take 2 capsules (40 mg total) by mouth daily.    Dispense:  60 capsule    Refill:  2    Take in the am  . ARIPiprazole (ABILIFY) 2 MG tablet    Sig: Take 1 tablet (2 mg total) by mouth daily.    Dispense:  30 tablet    Refill:  3  . donepezil (ARICEPT) 5 MG tablet    Sig: Take 1 tablet (5 mg total) by mouth at bedtime.    Dispense:  30 tablet    Refill:  2   Medical Decision Making Problem Points:  Established problem, stable/improving (1), Review of last therapy session (1) and Review of psycho-social stressors (1) Data Points:  Review or order clinical lab tests (1) Review of medication regiment & side effects (2)   I certify that outpatient services furnished can reasonably be expected to  improve the patient's condition.   Diannia Rudereborah Ross, MD

## 2013-05-20 ENCOUNTER — Ambulatory Visit (INDEPENDENT_AMBULATORY_CARE_PROVIDER_SITE_OTHER): Payer: BC Managed Care – PPO | Admitting: Psychology

## 2013-05-20 DIAGNOSIS — F329 Major depressive disorder, single episode, unspecified: Secondary | ICD-10-CM

## 2013-05-20 DIAGNOSIS — F09 Unspecified mental disorder due to known physiological condition: Secondary | ICD-10-CM

## 2013-05-20 DIAGNOSIS — F3289 Other specified depressive episodes: Secondary | ICD-10-CM

## 2013-05-20 DIAGNOSIS — F32A Depression, unspecified: Secondary | ICD-10-CM

## 2013-05-21 ENCOUNTER — Encounter (HOSPITAL_COMMUNITY): Payer: Self-pay | Admitting: Psychology

## 2013-05-21 NOTE — Progress Notes (Signed)
   PROGRESS NOTE  Patient:  Chad HamperBobby Grant   DOB: 03-02-1950  MR Number: [id number]  Location: [facility] [address] [place] [address] Dept: [phone]  Start: 3 PM End: 4 PM  Provider/Observer:     Hershal CoriaJohn R Rodenbough PSYD  Chief Complaint:      Chief Complaint  Patient presents with  . Depression    Reason For Service:     the patient was referred for individual psychotherapy in conjunction with the ongoing psychiatry care that he is taking. The patient has severe diabetes and lost his leg a couple of years ago and has not been coping well with this. He has that was significant suicidal ideation as well. His wife is very upset with him as he has not been taking care of himself the way he needs to. Prior to this the patient was a very heavy drinker and partying man who hated to go to doctors and hated to get help. He even had the issue he was having with his leg right to the point where he had to go to the hospital and have it amputated. The patient has isolated himself at the house even when his employer would call him every day. The patient then had a stroke on January 09, 2009 and this is exacerbated his anger and lethargy. This stroke sounds like it was in the right front part of the brain. He continues to get very angry when asked to bathe or wash his hands and also experiences significant sequential memory deficits.  The patient has increasingly refused to engage in self-care behaviors and his wife is at a point where she was overwhelmed. We may have to look at the possibility of assisted living care if the patient is not able to engage more.  Interventions Strategy:  Cognitive/behavioral psychotherapy  Participation Level:   Active  Participation Quality:  Resistant      Behavioral Observation:  Guarded, Lethargic, and Blunt.   Current Psychosocial Factors: The patient has been taking  a bath more often.  Content of Session:   Review current symptoms and continued work on therapeutic interventions.  Current Status:   The patient reports that he is continuing to have difficulty motivating himself to maintain personal hygiene and clearly describes a situation where he has difficulty being aware of an attending to his hygiene issues.  Patient Progress:   The patient is been doing very poorly and very little interventions of actually help.  Target Goals:     Last Reviewed:   4/30/20015  Goals Addressed Today:    Today we worked on basic expectations.  Impression/Diagnosis:   Prior to the stroke the patient was a heavy drinker and spent a lot of time going out. The patient has severe diabetes and avoided talking to his wife about that even to the point where his leg was amputated and she did not find that he was having troubles with his leg until the date was amputated. The patient had a stroke in December of 2010 that affected the right front part of his brain and the patient has not only behavioral changes as a result of the stroke but also has difficulty sequencing events and understanding the timeline of events.  Diagnosis:    Axis I: Cognitive disorder  Depression   RODENBOUGH,JOHN R, PsyD 05/21/2013

## 2013-07-05 ENCOUNTER — Ambulatory Visit (HOSPITAL_COMMUNITY): Payer: Self-pay | Admitting: Psychiatry

## 2013-07-07 ENCOUNTER — Ambulatory Visit (HOSPITAL_COMMUNITY): Payer: Self-pay | Admitting: Psychology

## 2013-07-07 ENCOUNTER — Ambulatory Visit (HOSPITAL_COMMUNITY): Payer: Self-pay | Admitting: Psychiatry

## 2013-07-15 ENCOUNTER — Ambulatory Visit (INDEPENDENT_AMBULATORY_CARE_PROVIDER_SITE_OTHER): Payer: BC Managed Care – PPO | Admitting: Psychiatry

## 2013-07-15 ENCOUNTER — Encounter (HOSPITAL_COMMUNITY): Payer: Self-pay | Admitting: Psychiatry

## 2013-07-15 VITALS — BP 120/80 | Ht 69.0 in | Wt 153.0 lb

## 2013-07-15 DIAGNOSIS — F32A Depression, unspecified: Secondary | ICD-10-CM

## 2013-07-15 DIAGNOSIS — F329 Major depressive disorder, single episode, unspecified: Secondary | ICD-10-CM

## 2013-07-15 DIAGNOSIS — F4329 Adjustment disorder with other symptoms: Secondary | ICD-10-CM

## 2013-07-15 MED ORDER — FLUOXETINE HCL 20 MG PO CAPS: 40.0000 mg | ORAL_CAPSULE | Freq: Every day | ORAL | Status: DC

## 2013-07-15 MED ORDER — ARIPIPRAZOLE 2 MG PO TABS: 2.0000 mg | ORAL_TABLET | Freq: Every day | ORAL | Status: DC

## 2013-07-15 MED ORDER — CARBAMAZEPINE ER 100 MG PO TB12: ORAL_TABLET | ORAL | Status: DC

## 2013-07-15 MED ORDER — DONEPEZIL HCL 5 MG PO TABS: 5.0000 mg | ORAL_TABLET | Freq: Every day | ORAL | Status: DC

## 2013-07-15 NOTE — Progress Notes (Signed)
Patient ID: Chad Grant, male   DOB: [birth year], 63 y.o.   MRN: [id number] Patient ID: Chad Grant, male   DOB: [birth year], 63 y.o.   MRN: [id number] Patient ID: Chad Grant, male   DOB: [birth year], 63 y.o.   MRN: [id number] Patient ID: Chad Grant, male   DOB: [birth year], 63 y.o.   MRN: [id number] Patient ID: Chad Grant, male   DOB: [birth year], 63 y.o.   MRN: [id number] Patient ID: Chad Grant, male   DOB: [birth year], 63 y.o.   MRN: [id number] Patient ID: Chad Grant, male   DOB: [birth year], 63 y.o.   MRN: [id number] [facility] [id number] Progress Note Chad Grant MRN: [id number] DOB: [birth year] Age: 63 y.o.  Date: 07/15/2013 Start Time: 3:00 PM End Time: 3:25 PM  Chief Complaint: Chief Complaint  Patient presents with  . Anxiety  . Depression  . Follow-up   Subjective: "He might have had a mini stroke This patient is a 63 year old married black male who lives [place] in [place]. He has 2 children who are grown. 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  Apparently when the patient was in better health and used to work he was a happy go lucky person according to his wife. Since she's had the amputation he has gone downhill. In fact he was hospitalized in June for severe depression. He's been on numerous medications in the past. He used to be angry and aggressive but now seems blunted and shut down. He has no interest in life. He sleeps all day or watches TV. He refuses to bathe which is a big problem for his wife. He is very defensive and gets angry when I questioned his wife about his status, almost to the point of paranoia. He claims he can't sleep but his wife reports that he sleeps through the day and not at night. He denies any pain. The patient is wife return after 2 months. He he states that 4 days ago on Monday he started to feel bad. He felt dizzy and weak. His wife stated that his speech was slurred  that day. She thinks he might of had another mini stroke but he refused to go the doctor or the ER. Since then he's been feeling "bad" weak and unable to walk without the aid of a walker. He agrees to see his primary doctor, Dr. Sherryll BurgerShah today and his wife is going to arrange this. Prior to this event he still wasn't doing all that well and was still refusing to go out much or bathe. He has also lost 10 pounds and spends most of his time in bed. I explained to the wife that we need to figure out what's going on with him medically before we can change any psychiatric medications   Psychiatric history Patient has been seeing in this office since 2009.  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  Since then patient has been notice very depressed and isolated.    In the past he had tried Depakote Celexa Xanax temazepam Cymbalta nortriptyline Ritalin however patient either due to side effects or had a poor response with these medication.  Vitals: BP 120/80  Ht 5\' 9"  (1.753 m)  Wt 153 lb (69.4 kg)  BMI 22.58 kg/m2  Allergies: No Known Allergies Medical History: Past Medical History  Diagnosis Date  . Diabetes mellitus type II   .  Hypertension   . Stroke   . Depression   . S/P BKA (below knee amputation) unilateral   Patient has history of CVA with weakness on one side. Patient has diabetes, hypertension and hyperlipidemia. Patient sees Dr. [place]ll [place]ah in [place].  His last hemoglobin A1c is 8.5.  Surgical History: Past Surgical History  Procedure Laterality Date  . Left leg amputa    . Left leg amputated    . Shoulder surgery    . Cataract removed     Family History: family history includes Alcohol abuse in his brother. There is no history of Anxiety disorder, Bipolar disorder, Dementia, Depression, Drug abuse, Paranoid behavior, Schizophrenia, OCD, Seizures, Sexual abuse, Physical abuse, or ADD / ADHD. Reviewed in office today and nothing has changed.  Mental status  examination Patient is casually dressed and fairly groomed. He walks slowly with the aid of a walker He maintained or eye contact. He described his mood as up and down His speech is slow but not slurred  He denies any paranoid thinking or any delusions..  He denies any active or passive suicidal thinking and homicidal thinking. His attention and concentration is poor. Stating over and over that he feels "bad"  He denies any auditory or visual hallucination.  There were no delusion present.  He is difficult to engage in conversation. He is slow to respond  He is alert and oriented x3. His insight judgmen are poor and he is unable to care for himself t and impulse control is okay.  Lab Results:  No results found for this or any previous visit (from the past 2016 hour(s)).Last HA1c was 8.5  Assessment Axis I Major depressive disorder, existential crisis Axis II deferred Axis III see medical history Axis IV moderate  Plan/Discussion: I took his vitals.  I reviewed CC, tobacco/med/surg Hx, meds effects/ side effects, problem list, therapies and responses as well as current situation/symptoms discussed options. The patient's symptoms sound congruent with a mini stroke. I strongly suggested he see his physician today. I'm not going to change any of his medications today but will have him come back in 4 weeks and his wife full time in the result of the visit See orders and pt instructions for more details.  MEDICATIONS this encounter: Meds ordered this encounter  Medications  . carbamazepine (TEGRETOL-XR) 100 MG 12 hr tablet    Sig: Take by mouth 1 twice a day    Dispense:  60 tablet    Refill:  2  . FLUoxetine (PROZAC) 20 MG capsule    Sig: Take 2 capsules (40 mg total) by mouth daily.    Dispense:  60 capsule    Refill:  2    Take in the am  . ARIPiprazole (ABILIFY) 2 MG tablet    Sig: Take 1 tablet (2 mg total) by mouth daily.    Dispense:  30 tablet    Refill:  3  . donepezil (ARICEPT) 5  MG tablet    Sig: Take 1 tablet (5 mg total) by mouth at bedtime.    Dispense:  30 tablet    Refill:  2   Medical Decision Making Problem Points:  Established problem, stable/improving (1), Review of last therapy session (1) and Review of psycho-social stressors (1) Data Points:  Review or order clinical lab tests (1) Review of medication regiment & side effects (2)   I certify that outpatient services furnished can reasonably be expected to improve the patient's condition.   Diannia RuderOSS, DEBORAH, MD

## 2013-07-20 ENCOUNTER — Ambulatory Visit (HOSPITAL_COMMUNITY): Payer: Self-pay | Admitting: Psychology

## 2013-10-15 ENCOUNTER — Encounter: Payer: Self-pay | Admitting: Surgery

## 2013-10-18 ENCOUNTER — Encounter: Payer: Self-pay | Admitting: Surgery

## 2013-10-18 ENCOUNTER — Ambulatory Visit (INDEPENDENT_AMBULATORY_CARE_PROVIDER_SITE_OTHER): Payer: BC Managed Care – PPO | Admitting: Surgery

## 2013-10-18 VITALS — BP 140/80 | HR 95 | Ht 69.0 in | Wt 151.0 lb

## 2013-10-18 DIAGNOSIS — L98499 Non-pressure chronic ulcer of skin of other sites with unspecified severity: Principal | ICD-10-CM

## 2013-10-18 DIAGNOSIS — I739 Peripheral vascular disease, unspecified: Secondary | ICD-10-CM

## 2013-10-18 NOTE — Progress Notes (Signed)
Patient name: Chad Grant MRN: [id number] DOB: Jun 12, 1950 Sex: male     Chief Complaint  Patient presents with  . Re-evaluation    1 month f/u, s/p aortogram 04/29/2012    HISTORY OF PRESENT ILLNESS: The patient comes back today for routine followup.  I have seen him in the past for a right third toe ulcer and great toe and ulcer that went on to require amputation.  He has undergone angiography which reveals no significant inflow or outflow disease and single vessel runoff via the peroneal artery which reconstitutes a dorsalis pedis.  He currently does not have any ulcers present.  He is status post left leg amputation at [facility].  He has had a stroke in the interval since I last saw him which has left him with some memory issues.  He continues to manage his diabetes with oral medications.  He is on a statin for hypercholesterolemia  Past Medical History  Diagnosis Date  . Diabetes mellitus type II   . Hypertension   . Stroke   . Depression   . S/P BKA (below knee amputation) unilateral     Past Surgical History  Procedure Laterality Date  . Left leg amputa    . Left leg amputated    . Shoulder surgery    . Cataract removed      History   Social History  . Marital Status: Married    Spouse Name: N/A    Number of Children: N/A  . Years of Education: N/A   Occupational History  . Not on file.   Social History Main Topics  . Smoking status: Never Smoker   . Smokeless tobacco: Never Used  . Alcohol Use: No  . Drug Use: No  . Sexual Activity: Not Currently   Other Topics Concern  . Not on file   Social History Narrative  . No narrative on file    Family History  Problem Relation Age of Onset  . Alcohol abuse Brother   . Diabetes Brother   . Anxiety disorder Neg Hx   . Bipolar disorder Neg Hx   . Dementia Neg Hx   . Depression Neg Hx   . Drug abuse Neg Hx   . Paranoid behavior Neg Hx   . Schizophrenia Neg Hx   . OCD Neg Hx   . Seizures Neg Hx     . Sexual abuse Neg Hx   . Physical abuse Neg Hx   . ADD / ADHD Neg Hx   . Heart disease Mother   . Diabetes Father   . Diabetes Sister   . Peripheral vascular disease Sister     amputation  . Diabetes Daughter     Allergies as of 10/18/2013  . (No Known Allergies)    Current Outpatient Prescriptions on File Prior to Visit  Medication Sig Dispense Refill  . Acetaminophen (TYLENOL EXTRA STRENGTH PO) Take 1 tablet by mouth as needed.      . ARIPiprazole (ABILIFY) 2 MG tablet Take 1 tablet (2 mg total) by mouth daily.  30 tablet  3  . carbamazepine (TEGRETOL-XR) 100 MG 12 hr tablet Take by mouth 1 twice a day  60 tablet  2  . clopidogrel (PLAVIX) 75 MG tablet Take 1 tablet (75 mg total) by mouth daily.      Marland Kitchen donepezil (ARICEPT) 5 MG tablet Take 1 tablet (5 mg total) by mouth at bedtime.  30 tablet  2  . FLUoxetine (PROZAC) 20  MG capsule Take 2 capsules (40 mg total) by mouth daily.  60 capsule  2  . metFORMIN (GLUCOPHAGE) 500 MG tablet Take 1 tablet (500 mg total) by mouth 2 (two) times daily with a meal.      . metoprolol tartrate (LOPRESSOR) 25 MG tablet Take 1 tablet (25 mg total) by mouth 2 (two) times daily.      . simvastatin (ZOCOR) 40 MG tablet Take 40 mg by mouth every evening.      . sitaGLIPtin (JANUVIA) 100 MG tablet Take 1 tablet (100 mg total) by mouth daily.      . insulin glargine (LANTUS) 100 UNIT/ML injection Inject 0.2-0.25 mLs (20-25 Units total) into the skin at bedtime. 20 units normally, if sugar above 180 will take additonal.  10 mL  12  . NOVOFINE 32G X 6 MM MISC        No current facility-administered medications on file prior to visit.     REVIEW OF SYSTEMS: Cardiovascular: No chest pain, chest pressure, palpitations, orthopnea, or dyspnea on exertion.  Pulmonary: No productive cough, asthma or wheezing. Neurologic: Memory loss Hematologic: No bleeding problems or clotting disorders. Musculoskeletal: Left BKA  Gastrointestinal: No blood in stool or  hematemesis Genitourinary: No dysuria or hematuria. Psychiatric:: No history of major depression. Integumentary: No rashes or ulcers. Constitutional: No fever or chills.  PHYSICAL EXAMINATION:   Vital signs are BP 140/80  Pulse 95  Ht  (1.753 m)  Wt 151 lb (68.493 kg)  BMI 22.29 kg/m2  SpO2 99% General: The patient appears their stated age. HEENT:  No gross abnormalities Pulmonary:  Non labored breathing Musculoskeletal: There are no major deformities. Neurologic: No focal weakness or paresthesias are detected, Skin: The right foot is without ulceration Psychiatric: The patient has normal affect. Cardiovascular: There is a regular rate and rhythm without significant murmur appreciated.  No carotid bruit   Diagnostic Studies None  Assessment: Peripheral vascular disease Plan: The patient has been stable over the course of the past year, regarding his peripheral vascular disease.  He has had a stroke approximately 5 months ago which left him with some memory loss.  His carotid Dopplers historically has had less than 40% stenosis.  He does not have any open sores at this time.  From my perspective he is doing very well.  I told the patient to followup with me should he develop a ulcer on his right foot  V. Charlena Cross, M.D. [facility] Office: [phone] Pager:  [phone]

## 2013-10-26 ENCOUNTER — Encounter (INDEPENDENT_AMBULATORY_CARE_PROVIDER_SITE_OTHER): Payer: Self-pay | Admitting: *Deleted

## 2013-11-08 ENCOUNTER — Ambulatory Visit (INDEPENDENT_AMBULATORY_CARE_PROVIDER_SITE_OTHER): Payer: Self-pay | Admitting: Internal Medicine

## 2013-12-30 ENCOUNTER — Encounter (HOSPITAL_COMMUNITY): Payer: Self-pay | Admitting: Surgery

## 2014-02-22 ENCOUNTER — Encounter (HOSPITAL_COMMUNITY): Payer: Self-pay | Admitting: Psychiatry

## 2014-02-22 ENCOUNTER — Ambulatory Visit (INDEPENDENT_AMBULATORY_CARE_PROVIDER_SITE_OTHER): Payer: BLUE CROSS/BLUE SHIELD | Admitting: Psychiatry

## 2014-02-22 VITALS — BP 130/66 | HR 72 | Ht 69.0 in | Wt 154.0 lb

## 2014-02-22 DIAGNOSIS — F09 Unspecified mental disorder due to known physiological condition: Secondary | ICD-10-CM

## 2014-02-22 DIAGNOSIS — F32A Depression, unspecified: Secondary | ICD-10-CM

## 2014-02-22 DIAGNOSIS — F329 Major depressive disorder, single episode, unspecified: Secondary | ICD-10-CM

## 2014-02-22 MED ORDER — CARBAMAZEPINE ER 100 MG PO TB12: ORAL_TABLET | ORAL | Status: DC

## 2014-02-22 MED ORDER — DONEPEZIL HCL 5 MG PO TABS: 5.0000 mg | ORAL_TABLET | Freq: Every day | ORAL | Status: DC

## 2014-02-22 MED ORDER — ARIPIPRAZOLE 5 MG PO TABS: 5.0000 mg | ORAL_TABLET | Freq: Every day | ORAL | Status: DC

## 2014-02-22 MED ORDER — FLUOXETINE HCL 20 MG PO CAPS: 40.0000 mg | ORAL_CAPSULE | Freq: Every day | ORAL | Status: DC

## 2014-02-22 NOTE — Progress Notes (Signed)
Patient ID: Chad Grant, male   DOB: 10/14/1950, 64 y.o.   MRN: [id number] Patient ID: Chad Grant, male   DOB: 10/14/1950, 64 y.o.   MRN: [id number] Patient ID: Chad Grant, male   DOB: 10/14/1950, 64 y.o.   MRN: [id number] Patient ID: Chad Grant, male   DOB: 10/14/1950, 64 y.o.   MRN: [id number] Patient ID: Chad Grant, male   DOB: 10/14/1950, 64 y.o.   MRN: [id number] Patient ID: Chad Grant, male   DOB: 10/14/1950, 64 y.o.   MRN: [id number] Patient ID: Chad Grant, male   DOB: 10/14/1950, 64 y.o.   MRN: [id number] Patient ID: Chad Grant, male   DOB: 10/14/1950, 64 y.o.   MRN: [id number] [facility] [id number] Progress Note Chad Grant MRN: [id number] DOB: 10/14/1950 Age: 64 y.o.  Date: 02/22/2014   Chief Complaint: Chief Complaint  Patient presents with  . Depression  . Anxiety  . Follow-up   Subjective: "He still won't do much for himself This patient is a 64 year old married black male who lives with his wife in [place]. He has 2 children who are grown. 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  Apparently when the patient was in better health and used to work he was a happy go lucky person according to his wife. Since she's had the amputation he has gone downhill. In fact he was hospitalized in June for severe depression. He's been on numerous medications in the past. He used to be angry and aggressive but now seems blunted and shut down. He has no interest in life. He sleeps all day or watches TV. He refuses to bathe which is a big problem for his wife. He is very defensive and gets angry when I questioned his wife about his status, almost to the point of paranoia. He claims he can't sleep but his wife reports that he sleeps through the day and not at night. He denies any pain. The patient is wife return after a long absence. He was last seen last June. Right around that time he had a "mini stroke." He had  regressed somewhat. His wife has brought in a family friend to sit with him several days a week and this man seems to have gotten him up in about a little bit more. He is still not gaining much weight refuses to go to the bathroom and is now wearing depends. His interest and motivation is still low but his affect was brighter today than I've seen in a while. He is not always compliant with medication and I strongly suggested getting home health involved although the patient doesn't want to do this   Psychiatric history Patient has been seeing in this office since 2009.  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  Since then patient has been notice very depressed and isolated.    In the past he had tried Depakote Celexa Xanax temazepam Cymbalta nortriptyline Ritalin however patient either due to side effects or had a poor response with these medication.  Vitals: BP 130/66 mmHg  Pulse 72  Ht 5\' 9"  (1.753 m)  Wt 154 lb (69.854 kg)  BMI 22.73 kg/m2  Allergies: No Known Allergies Medical History: Past Medical History  Diagnosis Date  . Diabetes mellitus type II   . Hypertension   . Stroke   . Depression   . S/P BKA (below knee amputation)  unilateral   Patient has history of CVA with weakness on one side. Patient has diabetes, hypertension and hyperlipidemia. Patient sees Dr. Sherryll Burger in [place].  His last hemoglobin A1c is 8.5.  Surgical History: Past Surgical History  Procedure Laterality Date  . Left leg amputa    . Left leg amputated    . Shoulder surgery    . Cataract removed    . Abdominal aortagram N/A 04/29/2012    Procedure: ABDOMINAL Ronny Flurry;  Surgeon: Nada Libman, MD;  Location: [facility] CATH LAB;  Service: Cardiovascular;  Laterality: N/A;   Family History: family history includes Alcohol abuse in his brother; Diabetes in his brother, daughter, father, and sister; Heart disease in his mother; Peripheral vascular disease in his sister. There is no history  of Anxiety disorder, Bipolar disorder, Dementia, Depression, Drug abuse, Paranoid behavior, Schizophrenia, OCD, Seizures, Sexual abuse, Physical abuse, or ADD / ADHD. Reviewed in office today and nothing has changed.  Mental status examination Patient is casually dressed and fairly groomed. He is still very thin He walks slowly with the aid of a walker He greeted me in a friendly upbeat manner. For part of the session his eye contact was better but then he started staring off. His wife thinks he is still quite depressed and he agrees. Short-term memory is still poor  He denies any paranoid thinking or any delusions..  He denies any active or passive suicidal thinking and homicidal thinking. His attention and concentration is poor.  He denies any auditory or visual hallucination.  There were no delusion present.  He is difficult to engage in conversation. He is slow to respond  He is alert and oriented x3. His insight judgmen are poor and he is unable to care for himself t and impulse control is okay.  Lab Results:  No results found for this or any previous visit (from the past 2016 hour(s)).Last HA1c was 8.5  Assessment Axis I Major depressive disorder, existential crisis Axis II deferred Axis III see medical history Axis IV moderate  Plan/Discussion: I took his vitals.  I reviewed CC, tobacco/med/surg Hx, meds effects/ side effects, problem list, therapies and responses as well as current situation/symptoms discussed options. The patient will continue his current medications but increase Abilify to 5 mill grams daily to help with appetite and mood. I will call his primary doctor, Dr. Clelia Croft to suggest home health intervention to help with med compliance. He will return in 6 weeks See orders and pt instructions for more details.  MEDICATIONS this encounter: Meds ordered this encounter  Medications  . FLUoxetine (PROZAC) 20 MG capsule    Sig: Take 2 capsules (40 mg total) by mouth daily.     Dispense:  60 capsule    Refill:  2    Take in the am  . donepezil (ARICEPT) 5 MG tablet    Sig: Take 1 tablet (5 mg total) by mouth at bedtime.    Dispense:  30 tablet    Refill:  2  . carbamazepine (TEGRETOL-XR) 100 MG 12 hr tablet    Sig: Take by mouth 1 twice a day    Dispense:  60 tablet    Refill:  2  . ARIPiprazole (ABILIFY) 5 MG tablet    Sig: Take 1 tablet (5 mg total) by mouth daily.    Dispense:  30 tablet    Refill:  2   Medical Decision Making Problem Points:  Established problem, stable/improving (1), Review of last therapy session (1) and  Review of psycho-social stressors (1) Data Points:  Review or order clinical lab tests (1) Review of medication regiment & side effects (2)   I certify that outpatient services furnished can reasonably be expected to improve the patient's condition.   Diannia Ruder, MD

## 2014-04-05 ENCOUNTER — Ambulatory Visit (HOSPITAL_COMMUNITY): Payer: Self-pay | Admitting: Psychiatry

## 2014-04-12 ENCOUNTER — Ambulatory Visit (HOSPITAL_COMMUNITY): Payer: Self-pay | Admitting: Psychiatry

## 2014-04-26 ENCOUNTER — Encounter (HOSPITAL_COMMUNITY): Payer: Self-pay | Admitting: Psychiatry

## 2014-04-26 ENCOUNTER — Ambulatory Visit (INDEPENDENT_AMBULATORY_CARE_PROVIDER_SITE_OTHER): Payer: BLUE CROSS/BLUE SHIELD | Admitting: Psychiatry

## 2014-04-26 VITALS — BP 127/69 | HR 96 | Ht 69.0 in | Wt 158.2 lb

## 2014-04-26 DIAGNOSIS — F32A Depression, unspecified: Secondary | ICD-10-CM

## 2014-04-26 DIAGNOSIS — F09 Unspecified mental disorder due to known physiological condition: Secondary | ICD-10-CM

## 2014-04-26 DIAGNOSIS — F329 Major depressive disorder, single episode, unspecified: Secondary | ICD-10-CM

## 2014-04-26 MED ORDER — FLUOXETINE HCL 20 MG PO CAPS: 40.0000 mg | ORAL_CAPSULE | Freq: Every day | ORAL | Status: DC

## 2014-04-26 MED ORDER — DONEPEZIL HCL 5 MG PO TABS: 5.0000 mg | ORAL_TABLET | Freq: Every day | ORAL | Status: DC

## 2014-04-26 MED ORDER — CARBAMAZEPINE ER 100 MG PO TB12: ORAL_TABLET | ORAL | Status: DC

## 2014-04-26 MED ORDER — ARIPIPRAZOLE 5 MG PO TABS: 5.0000 mg | ORAL_TABLET | Freq: Every day | ORAL | Status: DC

## 2014-04-26 NOTE — Progress Notes (Signed)
Patient ID: Chad Grant, male   DOB: 18-Jan-1951, 64 y.o.   MRN: [id number] Patient ID: Chad Grant, male   DOB: 1951/01/02, 64 y.o.   MRN: [id number] Patient ID: Chad Grant, male   DOB: 1950-04-23, 64 y.o.   MRN: [id number] Patient ID: Chad Grant, male   DOB: [birth year], 64 y.o.   MRN: [id number] Patient ID: Chad Grant, male   DOB: 1950/10/21, 64 y.o.   MRN: [id number] Patient ID: Chad Grant, male   DOB: 1950/12/19, 64 y.o.   MRN: [id number] Patient ID: Chad Grant, male   DOB: Apr 06, 1950, 64 y.o.   MRN: [id number] Patient ID: Chad Grant, male   DOB: [birth year], 63 y.o.   MRN: [id number] Patient ID: Chad Grant, male   DOB: 10/31/1950, 64 y.o.   MRN: [id number] [facility] [id number] Progress Note Chad Grant MRN: [id number] DOB: [birth year] Age: 64 y.o.  Date: 04/26/2014   Chief Complaint: Chief Complaint  Patient presents with  . Depression  . Follow-up   Subjective: "He is doing a little better This patient is a 64-year-old married black male who lives with his wife in [place]. He has 2 children who are grown. 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  Apparently when the patient was in better health and used to work he was a happy go lucky person according to his wife. Since she's had the amputation he has gone downhill. In fact he was hospitalized in June for severe depression. He's been on numerous medications in the past. He used to be angry and aggressive but now seems blunted and shut down. He has no interest in life. He sleeps all day or watches TV. He refuses to bathe which is a big problem for his wife. He is very defensive and gets angry when I questioned his wife about his status, almost to the point of paranoia. He claims he can't sleep but his wife reports that he sleeps through the day and not at night. He denies any pain. The patient is wife return after 4 weeks. He is doing slightly better since I  increase the Abilify to 5 mg daily last month. He is out of his bed a little bit more and is starting to take some baths. He is eating better and is gained about 5 pounds. He has "chased off" the man who came to the house to help him. He doesn't seem to want anyone else in the home. He's not made any strides but some very minor improvements   Psychiatric history Patient has been seeing in this office since 2009.  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  Since then patient has been notice very depressed and isolated.    In the past he had tried Depakote Celexa Xanax temazepam Cymbalta nortriptyline Ritalin however patient either due to side effects or had a poor response with these medication.  Vitals: BP 127/69 mmHg  Pulse 96  Ht  (1.753 m)  Wt 158 lb 3.2 oz (71.759 kg)  BMI 23.35 kg/m2  Allergies: No Known Allergies Medical History: Past Medical History  Diagnosis Date  . Diabetes mellitus type II   . Hypertension   . Stroke   . Depression   . S/P BKA (below knee amputation) unilateral   Patient has history of CVA with weakness on one side. Patient has diabetes, hypertension and hyperlipidemia. Patient sees  Dr. Sherryll BurgerShah in [place].  His last hemoglobin A1c is 8.5.  Surgical History: Past Surgical History  Procedure Laterality Date  . Left leg amputa    . Left leg amputated    . Shoulder surgery    . Cataract removed    . Abdominal aortagram N/A 04/29/2012    Procedure: ABDOMINAL Ronny FlurryAORTAGRAM;  Surgeon: Nada LibmanVance W Brabham, MD;  [facility];  Service: Cardiovascular;  Laterality: N/A;   Family History: family history includes Alcohol abuse in his brother; Diabetes in his brother, daughter, father, and sister; Heart disease in his mother; Peripheral vascular disease in his sister. There is no history of Anxiety disorder, Bipolar disorder, Dementia, Depression, Drug abuse, Paranoid behavior, Schizophrenia, OCD, Seizures, Sexual abuse, Physical abuse,  or ADD / ADHD. Reviewed in office today and nothing has changed.  Mental status examination Patient is casually dressed and fairly groomed. He is still very thin He walks slowly with the aid of a walker He greeted me in a friendly upbeat manner. For part of the session his eye contact was better but then he started staring off. He states his mood is a little better Short-term memory is still poor  He denies any paranoid thinking or any delusions..  He denies any active or passive suicidal thinking and homicidal thinking. His attention and concentration is poor.  He denies any auditory or visual hallucination.  There were no delusion present.  He is difficult to engage in conversation. He is slow to respond  He is alert and oriented x3. His insight judgmen are poor and he is unable to care for himself t and impulse control is okay.  Lab Results:  No results found for this or any previous visit (from the past 2016 hour(s)).Last HA1c was 8.5  Assessment Axis I Major depressive disorder, existential crisis Axis II deferred Axis III see medical history Axis IV moderate  Plan/Discussion: I took his vitals.  I reviewed CC, tobacco/med/surg Hx, meds effects/ side effects, problem list, therapies and responses as well as current situation/symptoms discussed options. The patient will continue his current medications. His wife states his compliance is spotty and I reminded him to take his medications as prescribed. He'll return in 3 months See orders and pt instructions for more details.  MEDICATIONS this encounter: Meds ordered this encounter  Medications  . FLUoxetine (PROZAC) 20 MG capsule    Sig: Take 2 capsules (40 mg total) by mouth daily.    Dispense:  60 capsule    Refill:  2    Take in the am  . ARIPiprazole (ABILIFY) 5 MG tablet    Sig: Take 1 tablet (5 mg total) by mouth daily.    Dispense:  30 tablet    Refill:  2  . carbamazepine (TEGRETOL-XR) 100 MG 12 hr tablet    Sig: Take by  mouth 1 twice a day    Dispense:  60 tablet    Refill:  2  . donepezil (ARICEPT) 5 MG tablet    Sig: Take 1 tablet (5 mg total) by mouth at bedtime.    Dispense:  30 tablet    Refill:  2   Medical Decision Making Problem Points:  Established problem, stable/improving (1), Review of last therapy session (1) and Review of psycho-social stressors (1) Data Points:  Review or order clinical lab tests (1) Review of medication regiment & side effects (2)   I certify that outpatient services furnished can reasonably be expected to improve the patient's condition.  Levonne Spiller, MD

## 2014-08-02 ENCOUNTER — Ambulatory Visit (HOSPITAL_COMMUNITY): Payer: Self-pay | Admitting: Psychiatry

## 2014-08-30 ENCOUNTER — Ambulatory Visit (INDEPENDENT_AMBULATORY_CARE_PROVIDER_SITE_OTHER): Payer: BLUE CROSS/BLUE SHIELD | Admitting: Psychiatry

## 2014-08-30 ENCOUNTER — Telehealth (HOSPITAL_COMMUNITY): Payer: Self-pay | Admitting: *Deleted

## 2014-08-30 ENCOUNTER — Encounter (HOSPITAL_COMMUNITY): Payer: Self-pay | Admitting: Psychiatry

## 2014-08-30 VITALS — BP 140/68 | Ht 69.0 in | Wt 154.0 lb

## 2014-08-30 DIAGNOSIS — F329 Major depressive disorder, single episode, unspecified: Secondary | ICD-10-CM

## 2014-08-30 DIAGNOSIS — F32A Depression, unspecified: Secondary | ICD-10-CM

## 2014-08-30 MED ORDER — ARIPIPRAZOLE 5 MG PO TABS: 5.0000 mg | ORAL_TABLET | Freq: Every day | ORAL | Status: DC

## 2014-08-30 MED ORDER — BENZTROPINE MESYLATE 0.5 MG PO TABS: 0.5000 mg | ORAL_TABLET | Freq: Every day | ORAL | Status: DC

## 2014-08-30 MED ORDER — FLUOXETINE HCL 20 MG PO CAPS: 40.0000 mg | ORAL_CAPSULE | Freq: Every day | ORAL | Status: DC

## 2014-08-30 MED ORDER — CARBAMAZEPINE ER 100 MG PO TB12: ORAL_TABLET | ORAL | Status: DC

## 2014-08-30 MED ORDER — DONEPEZIL HCL 5 MG PO TABS: 5.0000 mg | ORAL_TABLET | Freq: Every day | ORAL | Status: DC

## 2014-08-30 NOTE — Telephone Encounter (Signed)
phone call from pharmacy regarding Tegretol XR 100.   Insurance wants authorization for the generic.

## 2014-08-30 NOTE — Progress Notes (Signed)
Patient ID: Chad Grant, male   DOB: Jul 19, 1950, 64 y.o.   MRN: [id number] Patient ID: Chad Grant, male   DOB: 22-Sep-1950, 64 y.o.   MRN: [id number] Patient ID: Chad Grant, male   DOB: Jan 22, 1950, 64 y.o.   MRN: [id number] Patient ID: Chad Grant, male   DOB: 21-Aug-1950, 64 y.o.   MRN: [id number] Patient ID: Chad Grant, male   DOB: Jan 27, 1950, 64 y.o.   MRN: [id number] Patient ID: Chad Grant, male   DOB: July 20, 1950, 64 y.o.   MRN: [id number] Patient ID: Chad Grant, male   DOB: [birth year], 64 y.o.   MRN: [id number] Patient ID: Chad Grant, male   DOB: April 29, 1950, 64 y.o.   MRN: [id number] Patient ID: Chad Grant, male   DOB: Jul 31, 1950, 64 y.o.   MRN: [id number] Patient ID: Chad Grant, male   DOB: 25-Apr-1950, 64 y.o.   MRN: [id number] [facility] [id number] Progress Note Chad Grant MRN: [id number] DOB: [birth year] Age: 64 y.o.  Date: 08/30/2014   Chief Complaint: Chief Complaint  Patient presents with  . Depression  . Anxiety  . Agitation  . Follow-up   Subjective: "He is doing a little better This patient is a 16-year-old married black male who lives with his wife in [place]. He has 2 children who are grown. 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  Apparently when the patient was in better health and used to work he was a happy go lucky person according to his wife. Since she's had the amputation he has gone downhill. In fact he was hospitalized in June for severe depression. He's been on numerous medications in the past. He used to be angry and aggressive but now seems blunted and shut down. He has no interest in life. He sleeps all day or watches TV. He refuses to bathe which is a big problem for his wife. He is very defensive and gets angry when I questioned his wife about his status, almost to the point of paranoia. He claims he can't sleep but his wife reports that he sleeps through the day and not at night. He  denies any pain. The patient is wife return after 4 months. He is doing slightly better but still does not want to take baths very often . This seems to be offputting to his family members. He has been spending time with a male friend who lives in the neighborhood. He claims there is "nothing going on" but is easier to for him to talk to her than his wife whom he claims "argues with me all the time". The wife states that she doesn't really argue but encourages him to take his medicines and take his baths. He's very obstinate about these things. He has some twitching in both legs which could be akathisia from Abilify. On the other hand the Abilify is helped his mood and energy so rather than stop but I think we will add benztropine   Psychiatric history Patient has been seeing in this office since 2009.  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  Since then patient has been notice very depressed and isolated.    In the past he had tried Depakote Celexa Xanax temazepam Cymbalta nortriptyline Ritalin however patient either due to side effects or had a poor response with these medication.  Vitals: BP 140/68 mmHg  Ht 5\' 9"  (1.753 m)  Wt  154 lb (69.854 kg)  BMI 22.73 kg/m2  Allergies: No Known Allergies Medical History: Past Medical History  Diagnosis Date  . Diabetes mellitus type II   . Hypertension   . Stroke   . Depression   . S/P BKA (below knee amputation) unilateral   Patient has history of CVA with weakness on one side. Patient has diabetes, hypertension and hyperlipidemia. Patient sees Dr. Sherryll Burger in [place].  His last hemoglobin A1c is 8.5.  Surgical History: Past Surgical History  Procedure Laterality Date  . Left leg amputa    . Left leg amputated    . Shoulder surgery    . Cataract removed    . Abdominal aortagram N/A 04/29/2012    Procedure: ABDOMINAL Ronny Flurry;  Surgeon: Nada Libman, MD;  Location: [facility] CATH LAB;  Service: Cardiovascular;   Laterality: N/A;   Family History: family history includes Alcohol abuse in his brother; Diabetes in his brother, daughter, father, and sister; Heart disease in his mother; Peripheral vascular disease in his sister. There is no history of Anxiety disorder, Bipolar disorder, Dementia, Depression, Drug abuse, Paranoid behavior, Schizophrenia, OCD, Seizures, Sexual abuse, Physical abuse, or ADD / ADHD. Reviewed in office today and nothing has changed.  Mental status examination Patient is casually dressed and fairly groomed. He is still very thin He walks slowly with the aid of a walker He greeted me in a friendly upbeat manner. For part of the session his eye contact was fairly good. He states his mood is a little better but he often becomes irritable according to his wife. She also reports his self-care is poor but this is not anything new Short-term memory is still poor  He denies any paranoid thinking or any delusions..  He denies any active or passive suicidal thinking and homicidal thinking. His attention and concentration is poor.  He denies any auditory or visual hallucination.  There were no delusion present.  He is difficult to engage in conversation. He is slow to respond  He is alert and oriented x3. His insight judgmen are poor and he is unable to care for himself t and impulse control is okay.  Lab Results:  No results found for this or any previous visit (from the past 2016 hour(s)).Last HA1c was 8.5  Assessment Axis I Major depressive disorder, existential crisis Axis II deferred Axis III see medical history Axis IV moderate  Plan/Discussion: I took his vitals.  I reviewed CC, tobacco/med/surg Hx, meds effects/ side effects, problem list, therapies and responses as well as current situation/symptoms discussed options. The patient will continue Tegretol from mood stabilization, Prozac for depression, Abilify for augmentation of Prozac and Aricept for memory loss. Benztropine will be  added for akathisia His wife states his compliance is spotty and I reminded him to take his medications as prescribed. He'll return in 3 months See orders and pt instructions for more details.  MEDICATIONS this encounter: Meds ordered this encounter  Medications  . carbamazepine (TEGRETOL-XR) 100 MG 12 hr tablet    Sig: Take by mouth 1 twice a day    Dispense:  60 tablet    Refill:  2  . ARIPiprazole (ABILIFY) 5 MG tablet    Sig: Take 1 tablet (5 mg total) by mouth daily.    Dispense:  30 tablet    Refill:  2  . donepezil (ARICEPT) 5 MG tablet    Sig: Take 1 tablet (5 mg total) by mouth at bedtime.    Dispense:  30 tablet  Refill:  2  . FLUoxetine (PROZAC) 20 MG capsule    Sig: Take 2 capsules (40 mg total) by mouth daily.    Dispense:  60 capsule    Refill:  2    Take in the am  . benztropine (COGENTIN) 0.5 MG tablet    Sig: Take 1 tablet (0.5 mg total) by mouth at bedtime.    Dispense:  30 tablet    Refill:  2   Medical Decision Making Problem Points:  Established problem, stable/improving (1), Review of last therapy session (1) and Review of psycho-social stressors (1) Data Points:  Review or order clinical lab tests (1) Review of medication regiment & side effects (2)   I certify that outpatient services furnished can reasonably be expected to improve the patient's condition.   Diannia Ruder, MD

## 2014-08-30 NOTE — Telephone Encounter (Signed)
Please route to [place] 

## 2014-09-01 ENCOUNTER — Telehealth (HOSPITAL_COMMUNITY): Payer: Self-pay | Admitting: *Deleted

## 2014-09-01 NOTE — Telephone Encounter (Signed)
voice message from [facility] pharmacy, please call regarding Tegretol for patient.

## 2014-09-01 NOTE — Telephone Encounter (Signed)
Information was sent to [facility]

## 2014-09-02 ENCOUNTER — Telehealth (HOSPITAL_COMMUNITY): Payer: Self-pay | Admitting: *Deleted

## 2014-09-02 NOTE — Telephone Encounter (Signed)
Prior authorization received for Tegretol XR received. Called [phone] was told to do it online with cover my meds.

## 2014-09-02 NOTE — Telephone Encounter (Signed)
Per pt pharmacy Chad Grant), they would like to get permission to refill pt Tegretol for generic. Asked Chad Grant if this is what pt insurance required and she stated yes. Informed Chad Grant if pt insurance is approving him for the generic medication and not the brand then they need to try filling pt medication for the generic.

## 2014-09-05 ENCOUNTER — Telehealth (HOSPITAL_COMMUNITY): Payer: Self-pay | Admitting: *Deleted

## 2014-09-05 NOTE — Telephone Encounter (Signed)
Prior Auth was submitted;

## 2014-09-05 NOTE — Telephone Encounter (Signed)
Prior authorization for Carbamazepine received. Submitted online with cover my meds.

## 2014-11-30 ENCOUNTER — Encounter (HOSPITAL_COMMUNITY): Payer: Self-pay | Admitting: Psychiatry

## 2014-11-30 ENCOUNTER — Ambulatory Visit (INDEPENDENT_AMBULATORY_CARE_PROVIDER_SITE_OTHER): Payer: BLUE CROSS/BLUE SHIELD | Admitting: Psychiatry

## 2014-11-30 VITALS — BP 136/53 | HR 69 | Ht 69.0 in | Wt 146.0 lb

## 2014-11-30 DIAGNOSIS — F329 Major depressive disorder, single episode, unspecified: Secondary | ICD-10-CM | POA: Diagnosis not present

## 2014-11-30 DIAGNOSIS — F32A Depression, unspecified: Secondary | ICD-10-CM

## 2014-11-30 MED ORDER — CARBAMAZEPINE ER 100 MG PO TB12: ORAL_TABLET | ORAL | Status: DC

## 2014-11-30 MED ORDER — DONEPEZIL HCL 5 MG PO TABS: 5.0000 mg | ORAL_TABLET | Freq: Every day | ORAL | Status: DC

## 2014-11-30 MED ORDER — ARIPIPRAZOLE 5 MG PO TABS: 5.0000 mg | ORAL_TABLET | Freq: Every day | ORAL | Status: DC

## 2014-11-30 MED ORDER — FLUOXETINE HCL 20 MG PO CAPS: 40.0000 mg | ORAL_CAPSULE | Freq: Every day | ORAL | Status: DC

## 2014-11-30 MED ORDER — BENZTROPINE MESYLATE 0.5 MG PO TABS: 0.5000 mg | ORAL_TABLET | Freq: Every day | ORAL | Status: DC

## 2014-11-30 NOTE — Progress Notes (Signed)
Patient ID: Chad Grant, male   DOB: 01-16-1951, 64 y.o.   MRN: [id number] Patient ID: Chad Grant, male   DOB: 01-16-1951, 64 y.o.   MRN: [id number] Patient ID: Chad Grant, male   DOB: 01-16-1951, 64 y.o.   MRN: [id number] Patient ID: Chad Grant, male   DOB: 01-16-1951, 64 y.o.   MRN: [id number] Patient ID: Chad Grant, male   DOB: 01-16-1951, 64 y.o.   MRN: [id number] Patient ID: Chad Grant, male   DOB: 01-16-1951, 64 y.o.   MRN: [id number] Patient ID: Chad Grant, male   DOB: 01-16-1951, 64 y.o.   MRN: [id number] Patient ID: Chad Grant, male   DOB: 01-16-1951, 64 y.o.   MRN: [id number] Patient ID: Chad Grant, male   DOB: 01-16-1951, 64 y.o.   MRN: [id number] Patient ID: Chad Grant, male   DOB: 01-16-1951, 64 y.o.   MRN: [id number] Patient ID: Chad HamperBobby Yeakel, male   DOB: 01-16-1951, 64 y.o.   MRN: [id number] [facility] [id number] Progress Note Chad HamperBobby Fujimoto MRN: [id number] DOB: 01-16-1951 Age: 64 y.o.  Date: 11/30/2014   Chief Complaint: Chief Complaint  Patient presents with  . Depression  . Anxiety  . Agitation  . Follow-up   Subjective: "He is doing a little better This patient is a [age]year-old married black male who lives with his wife in [place]. He has 2 children who are grown. 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  Apparently when the patient was in better health and used to work he was a happy go lucky person according to his wife. Since she's had the amputation he has gone downhill. In fact he was hospitalized in June for severe depression. He's been on numerous medications in the past. He used to be angry and aggressive but now seems blunted and shut down. He has no interest in life. He sleeps all day or watches TV. He refuses to bathe which is a big problem for his wife. He is very defensive and gets angry when I questioned his wife about his status, almost to the point of paranoia. He claims he can't  sleep but his wife reports that he sleeps through the day and not at night. He denies any pain.  The patient and wife return after 3 months. They obviously not getting along and very frustrated with each other. He claims she constantly nags him. She states that he won't do anything to maintain his health care and she's totally frustrated and fed up. He now has a foot ulcer on his right foot and is going to have to go to wound Center. He still wetting on himself not taking his medicines consistently and not eating well. He has lost another 8 pounds. She doesn't know what else to do and I think I will need to talk to his primary physician about getting in a home healthcare provider.   Psychiatric history Patient has been seeing in this office since 2009.  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  Since then patient has been notice very depressed and isolated.    In the past he had tried Depakote Celexa Xanax temazepam Cymbalta nortriptyline Ritalin however patient either due to side effects or had a poor response with these medication.  Vitals: BP 136/53 mmHg  Pulse 69  Ht 5\' 9"  (1.753 m)  Wt 146 lb (66.225 kg)  BMI 21.55 kg/m2  Allergies: No Known Allergies Medical History: Past Medical History  Diagnosis Date  . Diabetes mellitus type II   . Hypertension   . Stroke (HCC)   . Depression   . S/P BKA (below knee amputation) unilateral (HCC)   Patient has history of CVA with weakness on one side. Patient has diabetes, hypertension and hyperlipidemia. Patient sees Dr. Sherryll Burger in [place].  His last hemoglobin A1c is 8.5.  Surgical History: Past Surgical History  Procedure Laterality Date  . Left leg amputa    . Left leg amputated    . Shoulder surgery    . Cataract removed    . Abdominal aortagram N/A 04/29/2012    Procedure: ABDOMINAL Ronny Flurry;  Surgeon: Nada Libman, MD;  Location: [facility] CATH LAB;  Service: Cardiovascular;  Laterality: N/A;   Family  History: family history includes Alcohol abuse in his brother; Diabetes in his brother, daughter, father, and sister; Heart disease in his mother; Peripheral vascular disease in his sister. There is no history of Anxiety disorder, Bipolar disorder, Dementia, Depression, Drug abuse, Paranoid behavior, Schizophrenia, OCD, Seizures, Sexual abuse, Physical abuse, or ADD / ADHD. Reviewed in office today and nothing has changed.  Mental status examination Patient is casually dressed and fairly groomed. He is still very thin He walks slowly with the aid of a walker He greeted me in a friendly upbeat manner. However he was angry and irritable with his wife and she was with him. He states that he is doing the best he can but he really hasn't made much effort to take care of himself. He and his wife state that the antidepressant does help to some degree. It's unclear whether or not he is consistent with his medicine and therefore he probably needs more supervision. He denies suicidal ideation or auditory or visual hallucinations.  Lab Results:  No results found for this or any previous visit (from the past 2016 hour(s)).Last HA1c was 8.5  Assessment Axis I Major depressive disorder, e Axis II deferred Axis III see medical history Axis IV moderate  Plan/Discussion: I took his vitals.  I reviewed CC, tobacco/med/surg Hx, meds effects/ side effects, problem list, therapies and responses as well as current situation/symptoms discussed options. The patient will continue Tegretol from mood stabilization, Prozac for depression, Abilify for augmentation of Prozac and Aricept for memory loss. Benztropine will be added for akathisia His wife states his compliance is spotty and I reminded him to take his medications as prescribed. He'll return in 3 months. I will call his primary physician about getting home health care initiated See orders and pt instructions for more details.  MEDICATIONS this encounter: Meds  ordered this encounter  Medications  . solifenacin (VESICARE) 5 MG tablet    Sig: Take 2.5 mg by mouth daily.  . Sulfamethoxazole-Trimethoprim (BACTRIM PO)    Sig: Take by mouth as directed.  . carbamazepine (TEGRETOL-XR) 100 MG 12 hr tablet    Sig: Take by mouth 1 twice a day    Dispense:  60 tablet    Refill:  2  . ARIPiprazole (ABILIFY) 5 MG tablet    Sig: Take 1 tablet (5 mg total) by mouth daily.    Dispense:  30 tablet    Refill:  2  . benztropine (COGENTIN) 0.5 MG tablet    Sig: Take 1 tablet (0.5 mg total) by mouth at bedtime.    Dispense:  30 tablet    Refill:  2  . donepezil (ARICEPT) 5 MG tablet  Sig: Take 1 tablet (5 mg total) by mouth at bedtime.    Dispense:  30 tablet    Refill:  2  . FLUoxetine (PROZAC) 20 MG capsule    Sig: Take 2 capsules (40 mg total) by mouth daily.    Dispense:  60 capsule    Refill:  2    Take in the am   Medical Decision Making Problem Points:  Established problem, stable/improving (1), Review of last therapy session (1) and Review of psycho-social stressors (1) Data Points:  Review or order clinical lab tests (1) Review of medication regiment & side effects (2)   I certify that outpatient services furnished can reasonably be expected to improve the patient's condition.   Diannia Ruder, MD

## 2015-01-30 DIAGNOSIS — I10 Essential (primary) hypertension: Secondary | ICD-10-CM | POA: Diagnosis not present

## 2015-01-30 DIAGNOSIS — Z8673 Personal history of transient ischemic attack (TIA), and cerebral infarction without residual deficits: Secondary | ICD-10-CM | POA: Diagnosis not present

## 2015-01-30 DIAGNOSIS — F0280 Dementia in other diseases classified elsewhere without behavioral disturbance: Secondary | ICD-10-CM | POA: Diagnosis not present

## 2015-01-30 DIAGNOSIS — G2581 Restless legs syndrome: Secondary | ICD-10-CM | POA: Diagnosis not present

## 2015-01-30 DIAGNOSIS — Z8489 Family history of other specified conditions: Secondary | ICD-10-CM | POA: Diagnosis not present

## 2015-01-30 DIAGNOSIS — Z89512 Acquired absence of left leg below knee: Secondary | ICD-10-CM | POA: Diagnosis not present

## 2015-01-30 DIAGNOSIS — Z833 Family history of diabetes mellitus: Secondary | ICD-10-CM | POA: Diagnosis not present

## 2015-01-30 DIAGNOSIS — Z8249 Family history of ischemic heart disease and other diseases of the circulatory system: Secondary | ICD-10-CM | POA: Diagnosis not present

## 2015-01-30 DIAGNOSIS — Z7902 Long term (current) use of antithrombotics/antiplatelets: Secondary | ICD-10-CM | POA: Diagnosis not present

## 2015-01-30 DIAGNOSIS — E1169 Type 2 diabetes mellitus with other specified complication: Secondary | ICD-10-CM | POA: Diagnosis not present

## 2015-01-30 DIAGNOSIS — Z7984 Long term (current) use of oral hypoglycemic drugs: Secondary | ICD-10-CM | POA: Diagnosis not present

## 2015-01-30 DIAGNOSIS — E11621 Type 2 diabetes mellitus with foot ulcer: Secondary | ICD-10-CM | POA: Diagnosis not present

## 2015-01-30 DIAGNOSIS — M869 Osteomyelitis, unspecified: Secondary | ICD-10-CM | POA: Diagnosis not present

## 2015-01-30 DIAGNOSIS — G309 Alzheimer's disease, unspecified: Secondary | ICD-10-CM | POA: Diagnosis not present

## 2015-01-30 DIAGNOSIS — L97519 Non-pressure chronic ulcer of other part of right foot with unspecified severity: Secondary | ICD-10-CM | POA: Diagnosis not present

## 2015-01-30 DIAGNOSIS — Z79899 Other long term (current) drug therapy: Secondary | ICD-10-CM | POA: Diagnosis not present

## 2015-01-31 DIAGNOSIS — I10 Essential (primary) hypertension: Secondary | ICD-10-CM | POA: Diagnosis not present

## 2015-01-31 DIAGNOSIS — Z7984 Long term (current) use of oral hypoglycemic drugs: Secondary | ICD-10-CM | POA: Diagnosis not present

## 2015-01-31 DIAGNOSIS — M869 Osteomyelitis, unspecified: Secondary | ICD-10-CM | POA: Diagnosis not present

## 2015-01-31 DIAGNOSIS — E1169 Type 2 diabetes mellitus with other specified complication: Secondary | ICD-10-CM | POA: Diagnosis not present

## 2015-01-31 DIAGNOSIS — L97519 Non-pressure chronic ulcer of other part of right foot with unspecified severity: Secondary | ICD-10-CM | POA: Diagnosis not present

## 2015-01-31 DIAGNOSIS — Z79899 Other long term (current) drug therapy: Secondary | ICD-10-CM | POA: Diagnosis not present

## 2015-01-31 DIAGNOSIS — E119 Type 2 diabetes mellitus without complications: Secondary | ICD-10-CM | POA: Diagnosis not present

## 2015-01-31 DIAGNOSIS — E11621 Type 2 diabetes mellitus with foot ulcer: Secondary | ICD-10-CM | POA: Diagnosis not present

## 2015-01-31 DIAGNOSIS — M8660 Other chronic osteomyelitis, unspecified site: Secondary | ICD-10-CM | POA: Diagnosis not present

## 2015-02-22 DIAGNOSIS — M869 Osteomyelitis, unspecified: Secondary | ICD-10-CM | POA: Diagnosis not present

## 2015-02-22 DIAGNOSIS — L97519 Non-pressure chronic ulcer of other part of right foot with unspecified severity: Secondary | ICD-10-CM | POA: Diagnosis not present

## 2015-02-22 DIAGNOSIS — I779 Disorder of arteries and arterioles, unspecified: Secondary | ICD-10-CM | POA: Diagnosis not present

## 2015-02-22 DIAGNOSIS — E11621 Type 2 diabetes mellitus with foot ulcer: Secondary | ICD-10-CM | POA: Diagnosis not present

## 2015-03-02 ENCOUNTER — Ambulatory Visit (HOSPITAL_COMMUNITY): Payer: Self-pay | Admitting: Psychiatry

## 2015-03-20 ENCOUNTER — Encounter (HOSPITAL_COMMUNITY): Payer: Self-pay | Admitting: Psychiatry

## 2015-03-20 ENCOUNTER — Ambulatory Visit (INDEPENDENT_AMBULATORY_CARE_PROVIDER_SITE_OTHER): Payer: BLUE CROSS/BLUE SHIELD | Admitting: Psychiatry

## 2015-03-20 VITALS — BP 115/74 | HR 86 | Ht 69.0 in | Wt 154.0 lb

## 2015-03-20 DIAGNOSIS — F329 Major depressive disorder, single episode, unspecified: Secondary | ICD-10-CM | POA: Diagnosis not present

## 2015-03-20 DIAGNOSIS — F32A Depression, unspecified: Secondary | ICD-10-CM

## 2015-03-20 MED ORDER — BENZTROPINE MESYLATE 0.5 MG PO TABS
0.5000 mg | ORAL_TABLET | Freq: Every day | ORAL | Status: DC
Start: 2015-03-20 — End: 2015-06-22

## 2015-03-20 MED ORDER — FLUOXETINE HCL 20 MG PO CAPS: 40.0000 mg | ORAL_CAPSULE | Freq: Every day | ORAL | Status: DC

## 2015-03-20 MED ORDER — CARBAMAZEPINE ER 100 MG PO TB12: ORAL_TABLET | ORAL | Status: DC

## 2015-03-20 MED ORDER — DONEPEZIL HCL 5 MG PO TABS: 5.0000 mg | ORAL_TABLET | Freq: Every day | ORAL | Status: DC

## 2015-03-20 MED ORDER — ARIPIPRAZOLE 5 MG PO TABS: 5.0000 mg | ORAL_TABLET | Freq: Every day | ORAL | Status: DC

## 2015-03-20 NOTE — Progress Notes (Signed)
Patient ID: Yaser Harvill, male   DOB: 16-Apr-1950, 65 y.o.   MRN: [id number] Patient ID: Mandell Pangborn, male   DOB: 04-01-1950, 65 y.o.   MRN: [id number] Patient ID: Ikey Omary, male   DOB: 31-Jan-1950, 65 y.o.   MRN: [id number] Patient ID: Alston Berrie, male   DOB: August 29, 1950, 65 y.o.   MRN: [id number] Patient ID: Derrel Moore, male   DOB: November 08, 1950, 65 y.o.   MRN: [id number] Patient ID: Sulaiman Imbert, male   DOB: [birth year], 65 y.o.   MRN: [id number] Patient ID: Camdyn Beske, male   DOB: 07-04-1950, 65 y.o.   MRN: [id number] Patient ID: Culver Feighner, male   DOB: 11-17-1950, 65 y.o.   MRN: [id number] Patient ID: Amerigo Mcglory, male   DOB: 1950-02-25, 65 y.o.   MRN: [id number] Patient ID: Justinian Miano, male   DOB: 1950-07-21, 65 y.o.   MRN: [id number] Patient ID: Jerri Hargadon, male   DOB: November 24, 1950, 65 y.o.   MRN: [id number] Patient ID: Breven Guidroz, male   DOB: [birth year], 65 y.o.   MRN: [id number] [facility] [id number] Progress Note Christoffer Currier MRN: [id number] DOB: [birth year] Age: 65 y.o.  Date: 03/20/2015   Chief Complaint: Chief Complaint  Patient presents with  . Depression  . Follow-up   Subjective: "He is doing a little better This patient is a 65year-old married black male who lives with his wife in [place]. He has 2 children who are grown. 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  Apparently when the patient was in better health and used to work he was a happy go lucky person according to his wife. Since she's had the amputation he has gone downhill. In fact he was hospitalized in June for severe depression. He's been on numerous medications in the past. He used to be angry and aggressive but now seems blunted and shut down. He has no interest in life. He sleeps all day or watches TV. He refuses to bathe which is a big problem for his wife. He is very defensive and gets angry when I questioned his wife about his status,  almost to the point of paranoia. He claims he can't sleep but his wife reports that he sleeps through the day and not at night. He denies any pain.  The patient and wife return after 3 months. He seems to be doing a little bit better. Couple of weeks ago he had to have the fourth toe on his right foot amputated. He is walking with a walker today and seems to be getting along fairly well. He has a new friend another male neighbor who lives across the street. Unfortunately however the friend takes him to the store and sometimes he buys foods that are not good for his diabetes. He does seem more upbeat today. He states that he is trying to get up and do something every day rather than lie in bed. His eating has improved and he has regained 9 pounds   Psychiatric history Patient has been seeing in this office since 2009.  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  Since then patient has been notice very depressed and isolated.    In the past he had tried Depakote Celexa Xanax temazepam Cymbalta nortriptyline Ritalin however patient either due to side effects or had a poor response with these medication.  Vitals: BP 115/74 mmHg  Pulse 86  Ht  (1.753 m)  Wt 154 lb (69.854 kg)  BMI 22.73 kg/m2  SpO2 98%  Allergies: No Known Allergies Medical History: Past Medical History  Diagnosis Date  . Diabetes mellitus type II   . Hypertension   . Stroke (HCC)   . Depression   . S/P BKA (below knee amputation) unilateral (HCC)   Patient has history of CVA with weakness on one side. Patient has diabetes, hypertension and hyperlipidemia. Patient sees Dr. Sherryll Burger in [place].  His last hemoglobin A1c is 8.5.  Surgical History: Past Surgical History  Procedure Laterality Date  . Left leg amputa    . Left leg amputated    . Shoulder surgery    . Cataract removed    . Abdominal aortagram N/A 04/29/2012    Procedure: ABDOMINAL Ronny Flurry;  Surgeon: Nada Libman, MD;  Location:  [facility] CATH LAB;  Service: Cardiovascular;  Laterality: N/A;   Family History: family history includes Alcohol abuse in his brother; Diabetes in his brother, daughter, father, and sister; Heart disease in his mother; Peripheral vascular disease in his sister. There is no history of Anxiety disorder, Bipolar disorder, Dementia, Depression, Drug abuse, Paranoid behavior, Schizophrenia, OCD, Seizures, Sexual abuse, Physical abuse, or ADD / ADHD. Reviewed in office today and nothing has changed.  Mental status examination Patient is casually dressed and fairly groomed. He does not look so thin in the May stated anymore He walks slowly with the aid of a walker He greeted me in a friendly upbeat manner. He was more pleasant and cooperative today than he has been in the past. He states his depression is better and his affect is low but brighter. He denies suicidal ideation or auditory or visual hallucinations. Lab Results:  No results found for this or any previous visit (from the past 2016 hour(s)).Last HA1c was 8.5  Assessment Axis I Major depressive disorder, e Axis II deferred Axis III see medical history Axis IV moderate  Plan/Discussion: I took his vitals.  I reviewed CC, tobacco/med/surg Hx, meds effects/ side effects, problem list, therapies and responses as well as current situation/symptoms discussed options. The patient will continue Tegretol from mood stabilization, Prozac for depression, Abilify for augmentation of Prozac and Aricept for memory loss. Benztropine will be continued for akathisia  He'll return in 3 months.  See orders and pt instructions for more details.  MEDICATIONS this encounter: Meds ordered this encounter  Medications  . FLUoxetine (PROZAC) 20 MG capsule    Sig: Take 2 capsules (40 mg total) by mouth daily.    Dispense:  60 capsule    Refill:  2    Take in the am  . donepezil (ARICEPT) 5 MG tablet    Sig: Take 1 tablet (5 mg total) by mouth at bedtime.     Dispense:  30 tablet    Refill:  2  . ARIPiprazole (ABILIFY) 5 MG tablet    Sig: Take 1 tablet (5 mg total) by mouth daily.    Dispense:  30 tablet    Refill:  2  . carbamazepine (TEGRETOL-XR) 100 MG 12 hr tablet    Sig: Take by mouth 1 twice a day    Dispense:  60 tablet    Refill:  2  . benztropine (COGENTIN) 0.5 MG tablet    Sig: Take 1 tablet (0.5 mg total) by mouth at bedtime.    Dispense:  30 tablet    Refill:  2   Medical Decision Making Problem Points:  Established  problem, stable/improving (1), Review of last therapy session (1) and Review of psycho-social stressors (1) Data Points:  Review or order clinical lab tests (1) Review of medication regiment & side effects (2)   I certify that outpatient services furnished can reasonably be expected to improve the patient's condition.   Diannia Ruder, MD

## 2015-03-22 DIAGNOSIS — Z89512 Acquired absence of left leg below knee: Secondary | ICD-10-CM | POA: Diagnosis not present

## 2015-03-22 DIAGNOSIS — E11621 Type 2 diabetes mellitus with foot ulcer: Secondary | ICD-10-CM | POA: Diagnosis not present

## 2015-03-22 DIAGNOSIS — L97519 Non-pressure chronic ulcer of other part of right foot with unspecified severity: Secondary | ICD-10-CM | POA: Diagnosis not present

## 2015-03-23 DIAGNOSIS — Z89512 Acquired absence of left leg below knee: Secondary | ICD-10-CM | POA: Diagnosis not present

## 2015-03-23 DIAGNOSIS — L97519 Non-pressure chronic ulcer of other part of right foot with unspecified severity: Secondary | ICD-10-CM | POA: Diagnosis not present

## 2015-03-23 DIAGNOSIS — E11621 Type 2 diabetes mellitus with foot ulcer: Secondary | ICD-10-CM | POA: Diagnosis not present

## 2015-03-28 DIAGNOSIS — E11621 Type 2 diabetes mellitus with foot ulcer: Secondary | ICD-10-CM | POA: Diagnosis not present

## 2015-03-28 DIAGNOSIS — L97519 Non-pressure chronic ulcer of other part of right foot with unspecified severity: Secondary | ICD-10-CM | POA: Diagnosis not present

## 2015-03-28 DIAGNOSIS — Z89512 Acquired absence of left leg below knee: Secondary | ICD-10-CM | POA: Diagnosis not present

## 2015-03-29 DIAGNOSIS — L97519 Non-pressure chronic ulcer of other part of right foot with unspecified severity: Secondary | ICD-10-CM | POA: Diagnosis not present

## 2015-03-29 DIAGNOSIS — Z89512 Acquired absence of left leg below knee: Secondary | ICD-10-CM | POA: Diagnosis not present

## 2015-03-29 DIAGNOSIS — E11621 Type 2 diabetes mellitus with foot ulcer: Secondary | ICD-10-CM | POA: Diagnosis not present

## 2015-04-04 DIAGNOSIS — L97519 Non-pressure chronic ulcer of other part of right foot with unspecified severity: Secondary | ICD-10-CM | POA: Diagnosis not present

## 2015-04-04 DIAGNOSIS — E11621 Type 2 diabetes mellitus with foot ulcer: Secondary | ICD-10-CM | POA: Diagnosis not present

## 2015-04-04 DIAGNOSIS — Z89512 Acquired absence of left leg below knee: Secondary | ICD-10-CM | POA: Diagnosis not present

## 2015-04-05 DIAGNOSIS — Z89512 Acquired absence of left leg below knee: Secondary | ICD-10-CM | POA: Diagnosis not present

## 2015-04-05 DIAGNOSIS — L97519 Non-pressure chronic ulcer of other part of right foot with unspecified severity: Secondary | ICD-10-CM | POA: Diagnosis not present

## 2015-04-05 DIAGNOSIS — E11621 Type 2 diabetes mellitus with foot ulcer: Secondary | ICD-10-CM | POA: Diagnosis not present

## 2015-04-06 DIAGNOSIS — E11621 Type 2 diabetes mellitus with foot ulcer: Secondary | ICD-10-CM | POA: Diagnosis not present

## 2015-04-06 DIAGNOSIS — Z89512 Acquired absence of left leg below knee: Secondary | ICD-10-CM | POA: Diagnosis not present

## 2015-04-06 DIAGNOSIS — L97519 Non-pressure chronic ulcer of other part of right foot with unspecified severity: Secondary | ICD-10-CM | POA: Diagnosis not present

## 2015-04-18 DIAGNOSIS — Z89512 Acquired absence of left leg below knee: Secondary | ICD-10-CM | POA: Diagnosis not present

## 2015-04-18 DIAGNOSIS — E11621 Type 2 diabetes mellitus with foot ulcer: Secondary | ICD-10-CM | POA: Diagnosis not present

## 2015-04-18 DIAGNOSIS — L97519 Non-pressure chronic ulcer of other part of right foot with unspecified severity: Secondary | ICD-10-CM | POA: Diagnosis not present

## 2015-05-18 DIAGNOSIS — E119 Type 2 diabetes mellitus without complications: Secondary | ICD-10-CM | POA: Diagnosis not present

## 2015-05-18 DIAGNOSIS — E7800 Pure hypercholesterolemia, unspecified: Secondary | ICD-10-CM | POA: Diagnosis not present

## 2015-05-18 DIAGNOSIS — I10 Essential (primary) hypertension: Secondary | ICD-10-CM | POA: Diagnosis not present

## 2015-05-18 DIAGNOSIS — S0093XA Contusion of unspecified part of head, initial encounter: Secondary | ICD-10-CM | POA: Diagnosis not present

## 2015-05-18 DIAGNOSIS — G309 Alzheimer's disease, unspecified: Secondary | ICD-10-CM | POA: Diagnosis not present

## 2015-05-18 DIAGNOSIS — R55 Syncope and collapse: Secondary | ICD-10-CM | POA: Diagnosis not present

## 2015-05-18 DIAGNOSIS — W01198A Fall on same level from slipping, tripping and stumbling with subsequent striking against other object, initial encounter: Secondary | ICD-10-CM | POA: Diagnosis not present

## 2015-05-18 DIAGNOSIS — F329 Major depressive disorder, single episode, unspecified: Secondary | ICD-10-CM | POA: Diagnosis not present

## 2015-05-18 DIAGNOSIS — F0280 Dementia in other diseases classified elsewhere without behavioral disturbance: Secondary | ICD-10-CM | POA: Diagnosis not present

## 2015-05-18 DIAGNOSIS — Z8673 Personal history of transient ischemic attack (TIA), and cerebral infarction without residual deficits: Secondary | ICD-10-CM | POA: Diagnosis not present

## 2015-05-18 DIAGNOSIS — Z79899 Other long term (current) drug therapy: Secondary | ICD-10-CM | POA: Diagnosis not present

## 2015-05-18 DIAGNOSIS — Z7902 Long term (current) use of antithrombotics/antiplatelets: Secondary | ICD-10-CM | POA: Diagnosis not present

## 2015-05-18 DIAGNOSIS — I951 Orthostatic hypotension: Secondary | ICD-10-CM | POA: Diagnosis not present

## 2015-05-18 DIAGNOSIS — S0083XA Contusion of other part of head, initial encounter: Secondary | ICD-10-CM | POA: Diagnosis not present

## 2015-05-30 DIAGNOSIS — Z8631 Personal history of diabetic foot ulcer: Secondary | ICD-10-CM | POA: Diagnosis not present

## 2015-05-30 DIAGNOSIS — Z09 Encounter for follow-up examination after completed treatment for conditions other than malignant neoplasm: Secondary | ICD-10-CM | POA: Diagnosis not present

## 2015-05-30 DIAGNOSIS — E119 Type 2 diabetes mellitus without complications: Secondary | ICD-10-CM | POA: Diagnosis not present

## 2015-06-16 ENCOUNTER — Ambulatory Visit (HOSPITAL_COMMUNITY): Payer: Self-pay | Admitting: Psychiatry

## 2015-06-22 ENCOUNTER — Encounter (HOSPITAL_COMMUNITY): Payer: Self-pay | Admitting: Psychiatry

## 2015-06-22 ENCOUNTER — Ambulatory Visit (INDEPENDENT_AMBULATORY_CARE_PROVIDER_SITE_OTHER): Payer: BLUE CROSS/BLUE SHIELD | Admitting: Psychiatry

## 2015-06-22 VITALS — BP 130/67 | HR 64 | Ht 69.0 in | Wt 152.2 lb

## 2015-06-22 DIAGNOSIS — F32A Depression, unspecified: Secondary | ICD-10-CM

## 2015-06-22 DIAGNOSIS — F329 Major depressive disorder, single episode, unspecified: Secondary | ICD-10-CM | POA: Diagnosis not present

## 2015-06-22 MED ORDER — DONEPEZIL HCL 5 MG PO TABS: 5.0000 mg | ORAL_TABLET | Freq: Every day | ORAL | Status: DC

## 2015-06-22 MED ORDER — BENZTROPINE MESYLATE 0.5 MG PO TABS: 0.5000 mg | ORAL_TABLET | Freq: Every day | ORAL | Status: DC

## 2015-06-22 MED ORDER — ARIPIPRAZOLE 5 MG PO TABS: 5.0000 mg | ORAL_TABLET | Freq: Every day | ORAL | Status: DC

## 2015-06-22 MED ORDER — CARBAMAZEPINE ER 100 MG PO TB12: ORAL_TABLET | ORAL | Status: DC

## 2015-06-22 MED ORDER — FLUOXETINE HCL 20 MG PO CAPS: 40.0000 mg | ORAL_CAPSULE | Freq: Every day | ORAL | Status: DC

## 2015-06-22 NOTE — Progress Notes (Signed)
Patient ID: Chad Grant, male   DOB: 1950/02/06, 65 y.o.   MRN: [id number] Patient ID: Chad Grant, male   DOB: 1950/02/06, 65 y.o.   MRN: [id number] Patient ID: Chad Grant, male   DOB: 1950/02/06, 65 y.o.   MRN: [id number] Patient ID: Chad Grant, male   DOB: 1950/02/06, 65 y.o.   MRN: [id number] Patient ID: Chad Grant, male   DOB: 1950/02/06, 65 y.o.   MRN: [id number] Patient ID: Chad Grant, male   DOB: 1950/02/06, 65 y.o.   MRN: [id number] Patient ID: Chad Grant, male   DOB: 1950/02/06, 65 y.o.   MRN: [id number] Patient ID: Chad Grant, male   DOB: 1950/02/06, 65 y.o.   MRN: [id number] Patient ID: Chad Grant, male   DOB: 1950/02/06, 65 y.o.   MRN: [id number] Patient ID: Chad Grant, male   DOB: 1950/02/06, 65 y.o.   MRN: [id number] Patient ID: Chad HamperBobby Gipe, male   DOB: 1950/02/06, 65 y.o.   MRN: [id number] Patient ID: Chad HamperBobby Mcmath, male   DOB: 1950/02/06, 65 y.o.   MRN: [id number] Patient ID: Chad HamperBobby Kliethermes, male   DOB: 1950/02/06, 65 y.o.   MRN: [id number] [facility] [id number] Progress Note Chad HamperBobby Hickle MRN: [id number] DOB: 1950/02/06 Age: 65 y.o.  Date: 06/22/2015   Chief Complaint: Chief Complaint  Patient presents with  . Depression  . Memory Loss  . Anxiety  . Follow-up   Subjective: "He is doing a little better This patient is a [age]year-old married black male who lives with his wife in [place]. He has 2 children who are grown. 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  Apparently when the patient was in better health and used to work he was a happy go lucky person according to his wife. Since she's had the amputation he has gone downhill. In fact he was hospitalized in June for severe depression. He's been on numerous medications in the past. He used to be angry and aggressive but now seems blunted and shut down. He has no interest in life. He sleeps all day or watches TV. He refuses to bathe which is a big  problem for his wife. He is very defensive and gets angry when I questioned his wife about his status, almost to the point of paranoia. He claims he can't sleep but his wife reports that he sleeps through the day and not at night. He denies any pain.  The patient and wife return after 3 months. He seems to be doing about the same. He is jerking his leg particular areas of right leg repeatedly which is a symptom of akathisia. We tried adding Cogentin for this but it didn't help. At this point is probably worth stopping the Abilify and Cogentin because his akathisia is nonstop. He still has good and bad days and some days stays in bed all the time and seems confused. Other days he is getting up and going over to his neighbor's house or interacting with his brother. He still gets irritable with his wife and doesn't want to attend to his hygiene. Overall however's level of functioning is better than it was months back. He denies auditory or visual hallucinations or paranoia   Psychiatric history Patient has been seeing in this office since 2009.  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  Since then patient has been notice very depressed and isolated.  In the past he had tried Depakote Celexa Xanax temazepam Cymbalta nortriptyline Ritalin however patient either due to side effects or had a poor response with these medication.  Vitals: BP 130/67 mmHg  Pulse 64  Ht  (1.753 m)  Wt 152 lb 3.2 oz (69.037 kg)  BMI 22.47 kg/m2  SpO2 96%  Allergies: No Known Allergies Medical History: Past Medical History  Diagnosis Date  . Diabetes mellitus type II   . Hypertension   . Stroke (HCC)   . Depression   . S/P BKA (below knee amputation) unilateral (HCC)   Patient has history of CVA with weakness on one side. Patient has diabetes, hypertension and hyperlipidemia. Patient sees Dr. Sherryll Burger in [place].  His last hemoglobin A1c is 8.5.  Surgical History: Past Surgical History   Procedure Laterality Date  . Left leg amputa    . Left leg amputated    . Shoulder surgery    . Cataract removed    . Abdominal aortagram N/A 04/29/2012    Procedure: ABDOMINAL Ronny Flurry;  Surgeon: Nada Libman, MD;  Location: [facility] CATH LAB;  Service: Cardiovascular;  Laterality: N/A;   Family History: family history includes Alcohol abuse in his brother; Diabetes in his brother, daughter, father, and sister; Heart disease in his mother; Peripheral vascular disease in his sister. There is no history of Anxiety disorder, Bipolar disorder, Dementia, Depression, Drug abuse, Paranoid behavior, Schizophrenia, OCD, Seizures, Sexual abuse, Physical abuse, or ADD / ADHD. Reviewed in office today and nothing has changed.  Mental status examination Patient is casually dressed and fairly groomed. He does not look so thin in the May stated anymore He walks slowly with the aid of a walker He greeted me in a friendly upbeat manner. He was more pleasant and cooperative until his wife mentioned that he seemed a bit confused in the mornings and he became angry and irritable about her statement. He states his depression is better and his affect is low but brighter. He denies suicidal ideation or auditory or visual hallucinations. Lab Results:  No results found for this or any previous visit (from the past 2016 hour(s)).Last HA1c was 8.5  Assessment Axis I Major depressive disorder, e Axis II deferred Axis III see medical history Axis IV moderate  Plan/Discussion: I took his vitals.  I reviewed CC, tobacco/med/surg Hx, meds effects/ side effects, problem list, therapies and responses as well as current situation/symptoms discussed options. The patient will continue Tegretol from mood stabilization, Prozac for depression,  Aricept for memory loss. Marland Kitchen He will stop Abilify and benztropine  He'll return in 2 months.  See orders and pt instructions for more details.  MEDICATIONS this encounter: Meds ordered  this encounter  Medications  . benztropine (COGENTIN) 0.5 MG tablet    Sig: Take 1 tablet (0.5 mg total) by mouth at bedtime.    Dispense:  30 tablet    Refill:  2  . ARIPiprazole (ABILIFY) 5 MG tablet    Sig: Take 1 tablet (5 mg total) by mouth daily.    Dispense:  30 tablet    Refill:  2  . carbamazepine (TEGRETOL-XR) 100 MG 12 hr tablet    Sig: Take by mouth 1 twice a day    Dispense:  60 tablet    Refill:  2  . donepezil (ARICEPT) 5 MG tablet    Sig: Take 1 tablet (5 mg total) by mouth at bedtime.    Dispense:  30 tablet    Refill:  2  .  FLUoxetine (PROZAC) 20 MG capsule    Sig: Take 2 capsules (40 mg total) by mouth daily.    Dispense:  60 capsule    Refill:  2    Take in the am   Medical Decision Making Problem Points:  Established problem, stable/improving (1), Review of last therapy session (1) and Review of psycho-social stressors (1) Data Points:  Review or order clinical lab tests (1) Review of medication regiment & side effects (2)   I certify that outpatient services furnished can reasonably be expected to improve the patient's condition.   Diannia Ruder, MD

## 2015-08-17 ENCOUNTER — Telehealth (HOSPITAL_COMMUNITY): Payer: Self-pay | Admitting: *Deleted

## 2015-08-17 NOTE — Telephone Encounter (Signed)
left voice message regarding appointment on 08/24/15.   provider out of office.

## 2015-08-24 ENCOUNTER — Ambulatory Visit (HOSPITAL_COMMUNITY): Payer: Self-pay | Admitting: Psychiatry

## 2015-09-29 ENCOUNTER — Encounter (HOSPITAL_COMMUNITY): Payer: Self-pay | Admitting: Psychiatry

## 2015-09-29 ENCOUNTER — Ambulatory Visit (INDEPENDENT_AMBULATORY_CARE_PROVIDER_SITE_OTHER): Payer: BLUE CROSS/BLUE SHIELD | Admitting: Psychiatry

## 2015-09-29 ENCOUNTER — Ambulatory Visit (HOSPITAL_COMMUNITY): Payer: Self-pay | Admitting: Psychiatry

## 2015-09-29 VITALS — BP 121/55 | HR 75 | Ht 69.0 in | Wt 150.8 lb

## 2015-09-29 DIAGNOSIS — F09 Unspecified mental disorder due to known physiological condition: Secondary | ICD-10-CM

## 2015-09-29 DIAGNOSIS — F32A Depression, unspecified: Secondary | ICD-10-CM

## 2015-09-29 DIAGNOSIS — F329 Major depressive disorder, single episode, unspecified: Secondary | ICD-10-CM

## 2015-09-29 MED ORDER — DONEPEZIL HCL 5 MG PO TABS: 5.0000 mg | ORAL_TABLET | Freq: Every day | ORAL | 2 refills | Status: DC

## 2015-09-29 MED ORDER — CARBAMAZEPINE ER 100 MG PO TB12: ORAL_TABLET | ORAL | 2 refills | Status: DC

## 2015-09-29 MED ORDER — FLUOXETINE HCL 20 MG PO CAPS: 40.0000 mg | ORAL_CAPSULE | Freq: Every day | ORAL | 2 refills | Status: DC

## 2015-09-29 MED ORDER — ARIPIPRAZOLE 5 MG PO TABS: 5.0000 mg | ORAL_TABLET | ORAL | 2 refills | Status: DC

## 2015-09-29 NOTE — Progress Notes (Signed)
Patient ID: Chad Grant, male   DOB: 22-Oct-1950, 65 y.o.   MRN: [id number] Patient ID: Chad Grant, male   DOB: October 30, 1950, 65 y.o.   MRN: [id number] Patient ID: Chad Grant, male   DOB: 12-24-1950, 65 y.o.   MRN: [id number] Patient ID: Chad Grant, male   DOB: 08-Oct-1950, 65 y.o.   MRN: [id number] Patient ID: Chad Grant, male   DOB: 08-Dec-1950, 65 y.o.   MRN: [id number] Patient ID: Chad Grant, male   DOB: 07/12/1950, 65 y.o.   MRN: [id number] Patient ID: Chad Grant, male   DOB: [birth year], 65 y.o.   MRN: [id number] Patient ID: Chad Grant, male   DOB: [birth year], 65 y.o.   MRN: [id number] Patient ID: Chad Grant, male   DOB: 09-Sep-1950, 65 y.o.   MRN: [id number] Patient ID: Chad Grant, male   DOB: April 12, 1950, 65 y.o.   MRN: [id number] Patient ID: Chad Grant, male   DOB: 09/25/1950, 65 y.o.   MRN: [id number] Patient ID: Chad Grant, male   DOB: [birth year], 65 y.o.   MRN: [id number] Patient ID: Chad Grant, male   DOB: 10-Nov-1950, 65 y.o.   MRN: [id number] [facility] [id number] Progress Note Chad Grant MRN: [id number] DOB: 03/22/1950 Age: 65 y.o.  Date: 09/29/2015   Chief Complaint: Chief Complaint  Patient presents with  . Depression  . Memory Loss  . Follow-up   Subjective: "He is doing a little better This patient is a 64year-old married black male who lives with his wife in [place]. He has 2 children who are grown. 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  Apparently when the patient was in better health and used to work he was a happy go lucky person according to his wife. Since she's had the amputation he has gone downhill. In fact he was hospitalized in June for severe depression. He's been on numerous medications in the past. He used to be angry and aggressive but now seems blunted and shut down. He has no interest in life. He sleeps all day or watches TV. He refuses to bathe which is a big problem for his  wife. He is very defensive and gets angry when I questioned his wife about his status, almost to the point of paranoia. He claims he can't sleep but his wife reports that he sleeps through the day and not at night. He denies any pain.  The patient and wife return after 3 months with his wife. She relates that she had put him back on the Abilify because without it he shut down. He is not been jerking his leg is much as he did before. He is fairly stable although he tends to be very negative towards her. Started to get him up of a day and get him bathe because he is so stubborn about it. She is going to get a male friend to start helping out in the home. She still thinks the medicines have been helpful since he is better than he was but is still having memory lapses at times. He's not had any severe episodes of anger or paranoia   Psychiatric history Patient has been seeing in this office since 2009.  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  Since then patient has been notice very depressed and isolated.    In the past he had tried Depakote Celexa Xanax temazepam Cymbalta nortriptyline Ritalin  however patient either due to side effects or had a poor response with these medication.  Vitals: BP (!) 121/55 (BP Location: Right Arm, Patient Position: Sitting, Cuff Size: Normal)   Pulse 75   Ht 5\' 9"  (1.753 m)   Wt 150 lb 12.8 oz (68.4 kg)   BMI 22.27 kg/m   Allergies: No Known Allergies Medical History: Past Medical History:  Diagnosis Date  . Depression   . Diabetes mellitus type II   . Hypertension   . S/P BKA (below knee amputation) unilateral (HCC)   . Stroke [facility](HCC)   Patient has history of CVA with weakness on one side. Patient has diabetes, hypertension and hyperlipidemia. Patient sees Dr. Sherryll BurgerShah in [place].  His last hemoglobin A1c is 8.5.  Surgical History: Past Surgical History:  Procedure Laterality Date  . ABDOMINAL AORTAGRAM N/A 04/29/2012   Procedure:  ABDOMINAL Ronny FlurryAORTAGRAM;  Surgeon: Nada LibmanVance W Brabham, MD;  [facility] C[facility];  Service: Cardiovascular;  Laterality: N/A;  . cataract removed    . left leg amputa    . left leg amputated    . SHOULDER SURGERY     Family History: family history includes Alcohol abuse in his brother; Diabetes in his brother, daughter, father, and sister; Heart disease in his mother; Peripheral vascular disease in his sister. Reviewed in office today and nothing has changed.  Mental status examination Patient is casually dressed and fairly groomed.  He walks slowly with the aid of a walker He greeted me in a friendly upbeat manner. He was generally pleasant but a little bit irritable with his wife as usual He states his depression is better and his affect is low bit brighter. He denies suicidal ideation or auditory or visual hallucinations. He is stamping his right leg at times but not anywhere near as much as he has in the past Lab Results:  No results found for this or any previous visit (from the past 2016 hour(s)).Last HA1c was 8.5  Assessment Axis I Major depressive disorder,  Axis II deferred Axis III see medical history Axis IV moderate  Plan/Discussion: I took his vitals.  I reviewed CC, tobacco/med/surg Hx, meds effects/ side effects, problem list, therapies and responses as well as current situation/symptoms discussed options. The patient will continue Tegretol from mood stabilization, Prozac for depression,  Aricept for memory loss. . Abilify for augmentation See orders and pt instructions for more details.  MEDICATIONS this encounter: Meds ordered this encounter  Medications  . DISCONTD: ARIPiprazole (ABILIFY) 5 MG tablet    Sig: Take 5 mg by mouth every morning.    Refill:  1  . carbamazepine (TEGRETOL-XR) 100 MG 12 hr tablet    Sig: Take by mouth 1 twice a day    Dispense:  60 tablet    Refill:  2  . FLUoxetine (PROZAC) 20 MG capsule    Sig: Take 2 capsules (40 mg total) by mouth daily.     Dispense:  60 capsule    Refill:  2    Take in the am  . ARIPiprazole (ABILIFY) 5 MG tablet    Sig: Take 1 tablet (5 mg total) by mouth every morning.    Dispense:  30 tablet    Refill:  2  . donepezil (ARICEPT) 5 MG tablet    Sig: Take 1 tablet (5 mg total) by mouth at bedtime.    Dispense:  30 tablet    Refill:  2   Medical Decision Making Problem Points:  Established problem,  stable/improving (1), Review of last therapy session (1) and Review of psycho-social stressors (1) Data Points:  Review or order clinical lab tests (1) Review of medication regiment & side effects (2)   I certify that outpatient services furnished can reasonably be expected to improve the patient's condition.   Diannia Ruder, MD

## 2015-10-09 DIAGNOSIS — F329 Major depressive disorder, single episode, unspecified: Secondary | ICD-10-CM | POA: Diagnosis not present

## 2015-10-09 DIAGNOSIS — I639 Cerebral infarction, unspecified: Secondary | ICD-10-CM | POA: Diagnosis not present

## 2015-10-09 DIAGNOSIS — R197 Diarrhea, unspecified: Secondary | ICD-10-CM | POA: Diagnosis not present

## 2015-10-09 DIAGNOSIS — E1151 Type 2 diabetes mellitus with diabetic peripheral angiopathy without gangrene: Secondary | ICD-10-CM | POA: Diagnosis not present

## 2015-10-09 DIAGNOSIS — I739 Peripheral vascular disease, unspecified: Secondary | ICD-10-CM | POA: Diagnosis not present

## 2015-11-20 DIAGNOSIS — K3184 Gastroparesis: Secondary | ICD-10-CM | POA: Diagnosis not present

## 2015-11-20 DIAGNOSIS — H26491 Other secondary cataract, right eye: Secondary | ICD-10-CM | POA: Diagnosis not present

## 2015-11-20 DIAGNOSIS — Z6823 Body mass index (BMI) 23.0-23.9, adult: Secondary | ICD-10-CM | POA: Diagnosis not present

## 2015-11-20 DIAGNOSIS — H25812 Combined forms of age-related cataract, left eye: Secondary | ICD-10-CM | POA: Diagnosis not present

## 2015-11-20 DIAGNOSIS — Z789 Other specified health status: Secondary | ICD-10-CM | POA: Diagnosis not present

## 2015-11-20 DIAGNOSIS — F329 Major depressive disorder, single episode, unspecified: Secondary | ICD-10-CM | POA: Diagnosis not present

## 2015-11-20 DIAGNOSIS — Z1389 Encounter for screening for other disorder: Secondary | ICD-10-CM | POA: Diagnosis not present

## 2015-11-20 DIAGNOSIS — Z299 Encounter for prophylactic measures, unspecified: Secondary | ICD-10-CM | POA: Diagnosis not present

## 2015-11-20 DIAGNOSIS — E1140 Type 2 diabetes mellitus with diabetic neuropathy, unspecified: Secondary | ICD-10-CM | POA: Diagnosis not present

## 2015-11-20 DIAGNOSIS — E1143 Type 2 diabetes mellitus with diabetic autonomic (poly)neuropathy: Secondary | ICD-10-CM | POA: Diagnosis not present

## 2015-11-20 DIAGNOSIS — E1151 Type 2 diabetes mellitus with diabetic peripheral angiopathy without gangrene: Secondary | ICD-10-CM | POA: Diagnosis not present

## 2015-11-20 DIAGNOSIS — E7800 Pure hypercholesterolemia, unspecified: Secondary | ICD-10-CM | POA: Diagnosis not present

## 2015-11-21 DIAGNOSIS — H25812 Combined forms of age-related cataract, left eye: Secondary | ICD-10-CM | POA: Diagnosis not present

## 2015-11-24 NOTE — Patient Instructions (Signed)
Your procedure is scheduled on: 12/04/2015  Report to [facility] at  1120   AM.  Call this number if you have problems the morning of surgery: [phone]   Do not eat food or drink liquids :After Midnight.      Take these medicines the morning of surgery with A SIP OF WATER: abilify, tegretol, prozac, metoprolol.   Do not wear jewelry, make-up or nail polish.  Do not wear lotions, powders, or perfumes. You may wear deodorant.  Do not shave 48 hours prior to surgery.  Do not bring valuables to the hospital.  Contacts, dentures or bridgework may not be worn into surgery.  Leave suitcase in the car. After surgery it may be brought to your room.  For patients admitted to the hospital, checkout time is 11:00 AM the day of discharge.   Patients discharged the day of surgery will not be allowed to drive home.  :     Please read over the following fact sheets that you were given: Coughing and Deep Breathing, Surgical Site Infection Prevention, Anesthesia Post-op Instructions and Care and Recovery After Surgery    Cataract A cataract is a clouding of the lens of the eye. When a lens becomes cloudy, vision is reduced based on the degree and nature of the clouding. Many cataracts reduce vision to some degree. Some cataracts make people more near-sighted as they develop. Other cataracts increase glare. Cataracts that are ignored and become worse can sometimes look white. The white color can be seen through the pupil. CAUSES   Aging. However, cataracts may occur at any age, even in newborns.   Certain drugs.   Trauma to the eye.   Certain diseases such as diabetes.   Specific eye diseases such as chronic inflammation inside the eye or a sudden attack of a rare form of glaucoma.   Inherited or acquired medical problems.  SYMPTOMS   Gradual, progressive drop in vision in the affected eye.   Severe, rapid visual loss. This most often happens when trauma is the cause.  DIAGNOSIS  To detect  a cataract, an eye doctor examines the lens. Cataracts are best diagnosed with an exam of the eyes with the pupils enlarged (dilated) by drops.  TREATMENT  For an early cataract, vision may improve by using different eyeglasses or stronger lighting. If that does not help your vision, surgery is the only effective treatment. A cataract needs to be surgically removed when vision loss interferes with your everyday activities, such as driving, reading, or watching TV. A cataract may also have to be removed if it prevents examination or treatment of another eye problem. Surgery removes the cloudy lens and usually replaces it with a substitute lens (intraocular lens, IOL).  At a time when both you and your doctor agree, the cataract will be surgically removed. If you have cataracts in both eyes, only one is usually removed at a time. This allows the operated eye to heal and be out of danger from any possible problems after surgery (such as infection or poor wound healing). In rare cases, a cataract may be doing damage to your eye. In these cases, your caregiver may advise surgical removal right away. The vast majority of people who have cataract surgery have better vision afterward. HOME CARE INSTRUCTIONS  If you are not planning surgery, you may be asked to do the following:  Use different eyeglasses.   Use stronger or brighter lighting.   Ask your eye doctor about reducing  your medicine dose or changing medicines if it is thought that a medicine caused your cataract. Changing medicines does not make the cataract go away on its own.   Become familiar with your surroundings. Poor vision can lead to injury. Avoid bumping into things on the affected side. You are at a higher risk for tripping or falling.   Exercise extreme care when driving or operating machinery.   Wear sunglasses if you are sensitive to bright light or experiencing problems with glare.  SEEK IMMEDIATE MEDICAL CARE IF:   You have a  worsening or sudden vision loss.   You notice redness, swelling, or increasing pain in the eye.   You have a fever.  Document Released: 01/07/2005 Document Revised: 12/27/2010 Document Reviewed: 08/31/2010 [facility] Patient Information 2012 [place].PATIENT INSTRUCTIONS POST-ANESTHESIA  IMMEDIATELY FOLLOWING SURGERY:  Do not drive or operate machinery for the first twenty four hours after surgery.  Do not make any important decisions for twenty four hours after surgery or while taking narcotic pain medications or sedatives.  If you develop intractable nausea and vomiting or a severe headache please notify your doctor immediately.  FOLLOW-UP:  Please make an appointment with your surgeon as instructed. You do not need to follow up with anesthesia unless specifically instructed to do so.  WOUND CARE INSTRUCTIONS (if applicable):  Keep a dry clean dressing on the anesthesia/puncture wound site if there is drainage.  Once the wound has quit draining you may leave it open to air.  Generally you should leave the bandage intact for twenty four hours unless there is drainage.  If the epidural site drains for more than 36-48 hours please call the anesthesia department.  QUESTIONS?:  Please feel free to call your physician or the hospital operator if you have any questions, and they will be happy to assist you.

## 2015-11-27 ENCOUNTER — Encounter (HOSPITAL_COMMUNITY)
Admission: RE | Admit: 2015-11-27 | Discharge: 2015-11-27 | Disposition: A | Discharge status: Home / Self Care | Payer: BLUE CROSS/BLUE SHIELD | Source: Ambulatory Visit | Attending: Ophthalmology | Admitting: Ophthalmology

## 2015-11-27 ENCOUNTER — Encounter (HOSPITAL_COMMUNITY): Payer: Self-pay

## 2015-11-27 DIAGNOSIS — Z01812 Encounter for preprocedural laboratory examination: Secondary | ICD-10-CM | POA: Insufficient documentation

## 2015-11-27 DIAGNOSIS — Z01810 Encounter for preprocedural cardiovascular examination: Secondary | ICD-10-CM | POA: Insufficient documentation

## 2015-11-27 LAB — BASIC METABOLIC PANEL
ANION GAP: 8 (ref 5–15)
BUN: 14 mg/dL (ref 6–20)
CALCIUM: 9.5 mg/dL (ref 8.9–10.3)
CO2: 30 mmol/L (ref 22–32)
Chloride: 98 mmol/L — ABNORMAL LOW (ref 101–111)
Creatinine, Ser: 0.98 mg/dL (ref 0.61–1.24)
GLUCOSE: 197 mg/dL — AB (ref 65–99)
POTASSIUM: 3.7 mmol/L (ref 3.5–5.1)
Sodium: 136 mmol/L (ref 135–145)

## 2015-11-27 LAB — CBC WITH DIFFERENTIAL/PLATELET
BASOS ABS: 0 10*3/uL (ref 0.0–0.1)
BASOS PCT: 0 %
Eosinophils Absolute: 0.1 10*3/uL (ref 0.0–0.7)
Eosinophils Relative: 2 %
HEMATOCRIT: 41.5 % (ref 39.0–52.0)
Hemoglobin: 14.2 g/dL (ref 13.0–17.0)
LYMPHS PCT: 34 %
Lymphs Abs: 2.3 10*3/uL (ref 0.7–4.0)
MCH: 30.5 pg (ref 26.0–34.0)
MCHC: 34.2 g/dL (ref 30.0–36.0)
MCV: 89.1 fL (ref 78.0–100.0)
MONO ABS: 0.3 10*3/uL (ref 0.1–1.0)
Monocytes Relative: 5 %
NEUTROS ABS: 4 10*3/uL (ref 1.7–7.7)
NEUTROS PCT: 59 %
Platelets: 174 10*3/uL (ref 150–400)
RBC: 4.66 MIL/uL (ref 4.22–5.81)
RDW: 12.7 % (ref 11.5–15.5)
WBC: 6.9 10*3/uL (ref 4.0–10.5)

## 2015-12-04 ENCOUNTER — Ambulatory Visit (HOSPITAL_COMMUNITY)
Admission: RE | Admit: 2015-12-04 | Discharge: 2015-12-04 | Disposition: A | Discharge status: Home / Self Care | Payer: BLUE CROSS/BLUE SHIELD | Source: Ambulatory Visit | Attending: Ophthalmology | Admitting: Ophthalmology

## 2015-12-04 ENCOUNTER — Ambulatory Visit (HOSPITAL_COMMUNITY): Payer: BLUE CROSS/BLUE SHIELD | Admitting: Anesthesiology

## 2015-12-04 ENCOUNTER — Encounter (HOSPITAL_COMMUNITY): Payer: Self-pay | Admitting: *Deleted

## 2015-12-04 ENCOUNTER — Encounter (HOSPITAL_COMMUNITY)
Admission: RE | Disposition: A | Discharge status: Home / Self Care | Payer: Self-pay | Source: Ambulatory Visit | Attending: Ophthalmology

## 2015-12-04 DIAGNOSIS — F329 Major depressive disorder, single episode, unspecified: Secondary | ICD-10-CM | POA: Insufficient documentation

## 2015-12-04 DIAGNOSIS — E1136 Type 2 diabetes mellitus with diabetic cataract: Secondary | ICD-10-CM | POA: Diagnosis not present

## 2015-12-04 DIAGNOSIS — Z6824 Body mass index (BMI) 24.0-24.9, adult: Secondary | ICD-10-CM | POA: Diagnosis not present

## 2015-12-04 DIAGNOSIS — K3184 Gastroparesis: Secondary | ICD-10-CM | POA: Diagnosis not present

## 2015-12-04 DIAGNOSIS — Z299 Encounter for prophylactic measures, unspecified: Secondary | ICD-10-CM | POA: Diagnosis not present

## 2015-12-04 DIAGNOSIS — I739 Peripheral vascular disease, unspecified: Secondary | ICD-10-CM | POA: Diagnosis not present

## 2015-12-04 DIAGNOSIS — E1143 Type 2 diabetes mellitus with diabetic autonomic (poly)neuropathy: Secondary | ICD-10-CM | POA: Diagnosis not present

## 2015-12-04 DIAGNOSIS — H2512 Age-related nuclear cataract, left eye: Secondary | ICD-10-CM | POA: Diagnosis not present

## 2015-12-04 DIAGNOSIS — Z89529 Acquired absence of unspecified knee: Secondary | ICD-10-CM | POA: Diagnosis not present

## 2015-12-04 DIAGNOSIS — I10 Essential (primary) hypertension: Secondary | ICD-10-CM | POA: Insufficient documentation

## 2015-12-04 DIAGNOSIS — H25812 Combined forms of age-related cataract, left eye: Secondary | ICD-10-CM | POA: Diagnosis not present

## 2015-12-04 HISTORY — PX: CATARACT EXTRACTION W/PHACO: SHX586

## 2015-12-04 LAB — GLUCOSE, CAPILLARY: GLUCOSE-CAPILLARY: 137 mg/dL — AB (ref 65–99)

## 2015-12-04 SURGERY — PHACOEMULSIFICATION, CATARACT, WITH IOL INSERTION
Anesthesia: Monitor Anesthesia Care | Site: Eye | Laterality: Left

## 2015-12-04 MED ORDER — TETRACAINE HCL 0.5 % OP SOLN
1.0000 [drp] | OPHTHALMIC | Status: AC
  Administered 2015-12-04 (×3): 1 [drp] via OPHTHALMIC

## 2015-12-04 MED ORDER — PHENYLEPHRINE HCL 2.5 % OP SOLN
1.0000 [drp] | OPHTHALMIC | Status: AC
  Administered 2015-12-04 (×3): 1 [drp] via OPHTHALMIC

## 2015-12-04 MED ORDER — FENTANYL CITRATE (PF) 100 MCG/2ML IJ SOLN
INTRAMUSCULAR | Status: AC
  Filled 2015-12-04: qty 2

## 2015-12-04 MED ORDER — MIDAZOLAM HCL 2 MG/2ML IJ SOLN
INTRAMUSCULAR | Status: AC
  Filled 2015-12-04: qty 2

## 2015-12-04 MED ORDER — EPINEPHRINE PF 1 MG/ML IJ SOLN
INTRAMUSCULAR | Status: AC
  Filled 2015-12-04: qty 1

## 2015-12-04 MED ORDER — PROVISC 10 MG/ML IO SOLN
INTRAOCULAR | Status: DC | PRN
  Administered 2015-12-04: 0.85 mL via INTRAOCULAR

## 2015-12-04 MED ORDER — POVIDONE-IODINE 5 % OP SOLN
OPHTHALMIC | Status: DC | PRN
  Administered 2015-12-04: 1 via OPHTHALMIC

## 2015-12-04 MED ORDER — BSS IO SOLN
INTRAOCULAR | Status: DC | PRN
  Administered 2015-12-04: 15 mL

## 2015-12-04 MED ORDER — MIDAZOLAM HCL 2 MG/2ML IJ SOLN
1.0000 mg | INTRAMUSCULAR | Status: DC | PRN
  Administered 2015-12-04 (×2): 1 mg via INTRAVENOUS

## 2015-12-04 MED ORDER — LACTATED RINGERS IV SOLN
INTRAVENOUS | Status: DC
Start: 2015-12-04 — End: 2015-12-04
  Administered 2015-12-04: 11:00:00 via INTRAVENOUS

## 2015-12-04 MED ORDER — LIDOCAINE 3.5 % OP GEL OPTIME - NO CHARGE
OPHTHALMIC | Status: DC | PRN
  Administered 2015-12-04: 1 [drp] via OPHTHALMIC

## 2015-12-04 MED ORDER — FENTANYL CITRATE (PF) 100 MCG/2ML IJ SOLN
25.0000 ug | INTRAMUSCULAR | Status: DC | PRN
  Administered 2015-12-04: 25 ug via INTRAVENOUS

## 2015-12-04 MED ORDER — NEOMYCIN-POLYMYXIN-DEXAMETH 3.5-10000-0.1 OP SUSP
OPHTHALMIC | Status: DC | PRN
  Administered 2015-12-04: 2 [drp] via OPHTHALMIC

## 2015-12-04 MED ORDER — LIDOCAINE HCL 3.5 % OP GEL
1.0000 "application " | Freq: Once | OPHTHALMIC | Status: AC
  Administered 2015-12-04: 1 via OPHTHALMIC

## 2015-12-04 MED ORDER — EPINEPHRINE PF 1 MG/ML IJ SOLN
INTRAOCULAR | Status: DC | PRN
  Administered 2015-12-04: 500 mL

## 2015-12-04 MED ORDER — EPINEPHRINE PF 1 MG/ML IJ SOLN
INTRAOCULAR | Status: DC | PRN
  Administered 2015-12-04: .8 mL via OPHTHALMIC

## 2015-12-04 MED ORDER — CYCLOPENTOLATE-PHENYLEPHRINE 0.2-1 % OP SOLN
1.0000 [drp] | OPHTHALMIC | Status: AC
  Administered 2015-12-04 (×3): 1 [drp] via OPHTHALMIC

## 2015-12-04 SURGICAL SUPPLY — 11 items
CLOTH BEACON ORANGE TIMEOUT ST (SAFETY) ×3 IMPLANT
EYE SHIELD UNIVERSAL CLEAR (GAUZE/BANDAGES/DRESSINGS) ×3 IMPLANT
GLOVE BIOGEL PI IND STRL 6.5 (GLOVE) ×1 IMPLANT
GLOVE BIOGEL PI INDICATOR 6.5 (GLOVE) ×2
GLOVE EXAM NITRILE MD LF STRL (GLOVE) ×3 IMPLANT
PAD ARMBOARD 7.5X6 YLW CONV (MISCELLANEOUS) ×3 IMPLANT
SIGHTPATH CAT PROC W REG LENS (Ophthalmic Related) ×3 IMPLANT
SYRINGE LUER LOK 1CC (MISCELLANEOUS) ×3 IMPLANT
TAPE SURG TRANSPORE 1 IN (GAUZE/BANDAGES/DRESSINGS) ×1 IMPLANT
TAPE SURGICAL TRANSPORE 1 IN (GAUZE/BANDAGES/DRESSINGS) ×2
WATER STERILE IRR 250ML POUR (IV SOLUTION) ×3 IMPLANT

## 2015-12-04 NOTE — H&P (Signed)
I have reviewed the H&P, the patient was re-examined, and I have identified no interval changes in medical condition and plan of care since the history and physical of record  

## 2015-12-04 NOTE — Op Note (Signed)
Date of Admission: 12/04/2015  Date of Surgery: 12/04/2015   Pre-Op Dx: Cataract Left Eye  Post-Op Dx: Senile Combined Cataract Left  Eye,  Dx Code B14.782H25.812  Surgeon: Gemma PayorKerry Haeven Nickle, M.D.  Assistants: None  Anesthesia: Topical with MAC  Indications: Painless, progressive loss of vision with compromise of daily activities.  Surgery: Cataract Extraction with Intraocular lens Implant Left Eye  Discription: The patient had dilating drops and viscous lidocaine placed into the Left eye in the pre-op holding area. After transfer to the operating room, a time out was performed. The patient was then prepped and draped. Beginning with a 75 degree blade a paracentesis port was made at the surgeon's 2 o'clock position. The anterior chamber was then filled with 1% non-preserved lidocaine. This was followed by filling the anterior chamber with Provisc.  A 2.44mm keratome blade was used to make a clear corneal incision at the temporal limbus.  A bent cystatome needle was used to create a continuous tear capsulotomy. Hydrodissection was performed with balanced salt solution on a Fine canula. The lens nucleus was then removed using the phacoemulsification handpiece. Residual cortex was removed with the I&A handpiece. The anterior chamber and capsular bag were refilled with Provisc. A posterior chamber intraocular lens was placed into the capsular bag with it's injector. The implant was positioned with the Kuglan hook. The Provisc was then removed from the anterior chamber and capsular bag with the I&A handpiece. Stromal hydration of the main incision and paracentesis port was performed with BSS on a Fine canula. The wounds were tested for leak which was negative. The patient tolerated the procedure well. There were no operative complications. The patient was then transferred to the recovery room in stable condition.  Complications: None  Specimen: None  EBL: None  Prosthetic device: Hoya iSert 250, power 21.0 D,  SN [id number].

## 2015-12-04 NOTE — Discharge Instructions (Signed)

## 2015-12-04 NOTE — Anesthesia Procedure Notes (Signed)
Procedure Name: MAC Date/Time: 12/04/2015 11:56 AM Performed by: Shary DecampMOSES, Ludwika Rodd Pre-anesthesia Checklist: Patient identified, Emergency Drugs available, Suction available, Timeout performed and Patient being monitored Patient Re-evaluated:Patient Re-evaluated prior to inductionOxygen Delivery Method: Nasal Cannula

## 2015-12-04 NOTE — Anesthesia Preprocedure Evaluation (Signed)
Anesthesia Evaluation  Patient identified by MRN, date of birth, ID band Patient awake  General Assessment Comment:Slow mentation  Reviewed: Allergy & Precautions, NPO status , Patient's Chart, lab work & pertinent test results  Airway Mallampati: II  TM Distance: >3 FB     Dental  (+) Teeth Intact   Pulmonary neg pulmonary ROS,    breath sounds clear to auscultation       Cardiovascular hypertension, + Peripheral Vascular Disease   Rhythm:Regular Rate:Normal     Neuro/Psych PSYCHIATRIC DISORDERS Depression CVA    GI/Hepatic negative GI ROS,   Endo/Other  diabetes, Type 2  Renal/GU      Musculoskeletal   Abdominal   Peds  Hematology   Anesthesia Other Findings   Reproductive/Obstetrics                            Anesthesia Physical Anesthesia Plan  ASA: III  Anesthesia Plan: MAC   Post-op Pain Management:    Induction: Intravenous  Airway Management Planned: Nasal Cannula  Additional Equipment:   Intra-op Plan:   Post-operative Plan:   Informed Consent: I have reviewed the patients History and Physical, chart, labs and discussed the procedure including the risks, benefits and alternatives for the proposed anesthesia with the patient or authorized representative who has indicated his/her understanding and acceptance.     Plan Discussed with:   Anesthesia Plan Comments:         Anesthesia Quick Evaluation

## 2015-12-04 NOTE — Transfer of Care (Signed)
Immediate Anesthesia Transfer of Care Note  Patient: Chad Grant  Procedure(s) Performed: Procedure(s) with comments: CATARACT EXTRACTION PHACO AND INTRAOCULAR LENS PLACEMENT LEFT EYE CDE=19.92 (Left) - left  Patient Location: Short Stay  Anesthesia Type:MAC  Level of Consciousness: awake and alert   Airway & Oxygen Therapy: Patient Spontanous Breathing  Post-op Assessment: Report given to RN, Post -op Vital signs reviewed and stable and Patient moving all extremities X 4  Post vital signs: Reviewed and stable  Last Vitals:  Vitals:   12/04/15 1107  BP: 139/78  Pulse: (!) 58  Resp: (!) 21  Temp: 36.9 C    Last Pain:  Vitals:   12/04/15 1107  TempSrc: Oral      Patients Stated Pain Goal: 6 (12/04/15 1107)  Complications: No apparent anesthesia complications

## 2015-12-04 NOTE — Anesthesia Postprocedure Evaluation (Signed)
Anesthesia Post Note  Patient: Chad Grant  Procedure(s) Performed: Procedure(s) (LRB): CATARACT EXTRACTION PHACO AND INTRAOCULAR LENS PLACEMENT LEFT EYE CDE=19.92 (Left)  Patient location during evaluation: Short Stay Anesthesia Type: MAC Level of consciousness: awake, awake and alert and patient cooperative Pain management: satisfactory to patient Vital Signs Assessment: post-procedure vital signs reviewed and stable Respiratory status: spontaneous breathing Cardiovascular status: stable Anesthetic complications: no    Last Vitals:  Vitals:   12/04/15 1107  BP: 139/78  Pulse: (!) 58  Resp: (!) 21  Temp: 36.9 C    Last Pain:  Vitals:   12/04/15 1107  TempSrc: Oral                 Jamylah Marinaccio

## 2015-12-05 ENCOUNTER — Encounter (HOSPITAL_COMMUNITY): Payer: Self-pay | Admitting: Ophthalmology

## 2015-12-28 ENCOUNTER — Encounter (HOSPITAL_COMMUNITY): Payer: Self-pay | Admitting: Psychiatry

## 2015-12-28 ENCOUNTER — Ambulatory Visit (INDEPENDENT_AMBULATORY_CARE_PROVIDER_SITE_OTHER): Payer: BLUE CROSS/BLUE SHIELD | Admitting: Psychiatry

## 2015-12-28 DIAGNOSIS — Z811 Family history of alcohol abuse and dependence: Secondary | ICD-10-CM | POA: Diagnosis not present

## 2015-12-28 DIAGNOSIS — Z79899 Other long term (current) drug therapy: Secondary | ICD-10-CM

## 2015-12-28 DIAGNOSIS — Z833 Family history of diabetes mellitus: Secondary | ICD-10-CM | POA: Diagnosis not present

## 2015-12-28 DIAGNOSIS — F322 Major depressive disorder, single episode, severe without psychotic features: Secondary | ICD-10-CM | POA: Diagnosis not present

## 2015-12-28 DIAGNOSIS — Z8249 Family history of ischemic heart disease and other diseases of the circulatory system: Secondary | ICD-10-CM

## 2015-12-28 MED ORDER — CARBAMAZEPINE ER 100 MG PO TB12: ORAL_TABLET | ORAL | 2 refills | Status: DC

## 2015-12-28 MED ORDER — FLUOXETINE HCL 20 MG PO CAPS: 40.0000 mg | ORAL_CAPSULE | Freq: Every day | ORAL | 2 refills | Status: DC

## 2015-12-28 MED ORDER — ARIPIPRAZOLE 5 MG PO TABS: 5.0000 mg | ORAL_TABLET | ORAL | 2 refills | Status: DC

## 2015-12-28 MED ORDER — DONEPEZIL HCL 5 MG PO TABS: 5.0000 mg | ORAL_TABLET | Freq: Every day | ORAL | 2 refills | Status: DC

## 2015-12-28 NOTE — Progress Notes (Signed)
Patient ID: Chad Grant, male   DOB: 02-07-1950, 65 y.o.   MRN: [id number] Patient ID: Chad Grant, male   DOB: 02-07-1950, 65 y.o.   MRN: [id number] Patient ID: Chad Grant, male   DOB: 02-07-1950, 65 y.o.   MRN: [id number] Patient ID: Chad Grant, male   DOB: 02-07-1950, 65 y.o.   MRN: [id number] Patient ID: Chad Grant, male   DOB: 02-07-1950, 65 y.o.   MRN: [id number] Patient ID: Chad Grant, male   DOB: 02-07-1950, 65 y.o.   MRN: [id number] Patient ID: Chad Grant, male   DOB: 02-07-1950, 65 y.o.   MRN: [id number] Patient ID: Chad Grant, male   DOB: 02-07-1950, 65 y.o.   MRN: [id number] Patient ID: Chad Grant, male   DOB: 02-07-1950, 65 y.o.   MRN: [id number] Patient ID: Chad Grant, male   DOB: 02-07-1950, 65 y.o.   MRN: [id number] Patient ID: Chad HamperBobby Saric, male   DOB: 02-07-1950, 65 y.o.   MRN: [id number] Patient ID: Chad HamperBobby Life, male   DOB: 02-07-1950, 65 y.o.   MRN: [id number] Patient ID: Chad HamperBobby Holstein, male   DOB: 02-07-1950, 65 y.o.   MRN: [id number] [facility] [id number] Progress Note Chad HamperBobby Brunson MRN: [id number] DOB: 02-07-1950 Age: 65 y.o.  Date: 12/28/2015   Chief Complaint: Chief Complaint  Patient presents with  . Depression  . Anxiety  . Follow-up   Subjective: "He is doing a little better This patient is a [age]year-old married black male who lives with his wife in [place]. He has 2 children who are grown. 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  Apparently when the patient was in better health and used to work he was a happy go lucky person according to his wife. Since she's had the amputation he has gone downhill. In fact he was hospitalized in June for severe depression. He's been on numerous medications in the past. He used to be angry and aggressive but now seems blunted and shut down. He has no interest in life. He sleeps all day or watches TV. He refuses to bathe which is a big problem for his wife.  He is very defensive and gets angry when I questioned his wife about his status, almost to the point of paranoia. He claims he can't sleep but his wife reports that he sleeps through the day and not at night. He denies any pain.  The patient and wife return after 3 months with his wife. She relates that he is doing a little bit better. It may just be that she has a man coming in to help her now and she is not so overburdened with his care. He still gets confused about times and dates of things. He is getting up and about a little bit more with his new aide. His mood is been fairly stable and he doesn't seem as angry and hostile today   Psychiatric history Patient has been seeing in this office since 2009.  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  Since then patient has been notice very depressed and isolated.    In the past he had tried Depakote Celexa Xanax temazepam Cymbalta nortriptyline Ritalin however patient either due to side effects or had a poor response with these medication.  Vitals: BP 137/73   Pulse 69   Ht 5' 10.5" (1.791 m)   Wt 152 lb (68.9 kg)  BMI 21.50 kg/m   Allergies: No Known Allergies Medical History: Past Medical History:  Diagnosis Date  . Depression   . Diabetes mellitus type II   . Hypertension   . S/P BKA (below knee amputation) unilateral (HCC)   . Stroke [facility](HCC)   Patient has history of CVA with weakness on one side. Patient has diabetes, hypertension and hyperlipidemia. Patient sees Dr. Sherryll BurgerShah in [place].  His last hemoglobin A1c is 8.5.  Surgical History: Past Surgical History:  Procedure Laterality Date  . ABDOMINAL AORTAGRAM N/A 04/29/2012   Procedure: ABDOMINAL Ronny FlurryAORTAGRAM;  Surgeon: Nada LibmanVance W Brabham, MD;  [facility];  Service: Cardiovascular;  Laterality: N/A;  . CATARACT EXTRACTION W/PHACO Left 12/04/2015   Procedure: CATARACT EXTRACTION PHACO AND INTRAOCULAR LENS PLACEMENT LEFT EYE CDE=19.92;  Surgeon: Gemma PayorKerry Hunt,  MD;  Location: [facility] ORS;  Service: Ophthalmology;  Laterality: Left;  left  . cataract removed    . left leg amputa    . left leg amputated    . SHOULDER SURGERY     Family History: family history includes Alcohol abuse in his brother; Diabetes in his brother, daughter, father, and sister; Heart disease in his mother; Peripheral vascular disease in his sister. Reviewed in office today and nothing has changed.  Mental status examination Patient is casually dressed and fairly groomed.  He walks slowly with the aid of a walker He greeted me in a friendly upbeat manner. He was generally pleasantHe states his depression is better and his affect isbrighter. He denies suicidal ideation or auditory or visual hallucinations. He is not jerking his leg is much as he has in the past Lab Results:  Results for orders placed or performed during the hospital encounter of 12/04/15 (from the past 2016 hour(s))  Glucose, capillary   Collection Time: 12/04/15 11:06 AM  Result Value Ref Range   Glucose-Capillary 137 (H) 65 - 99 mg/dL  Results for orders placed or performed during the hospital encounter of 11/27/15 (from the past 2016 hour(s))  CBC with Differential/Platelet   Collection Time: 11/27/15  2:22 PM  Result Value Ref Range   WBC 6.9 4.0 - 10.5 K/uL   RBC 4.66 4.22 - 5.81 MIL/uL   Hemoglobin 14.2 13.0 - 17.0 g/dL   HCT 02.741.5 25.339.0 - 66.452.0 %   MCV 89.1 78.0 - 100.0 fL   MCH 30.5 26.0 - 34.0 pg   MCHC 34.2 30.0 - 36.0 g/dL   RDW 40.312.7 47.411.5 - 25.915.5 %   Platelets 174 150 - 400 K/uL   Neutrophils Relative % 59 %   Neutro Abs 4.0 1.7 - 7.7 K/uL   Lymphocytes Relative 34 %   Lymphs Abs 2.3 0.7 - 4.0 K/uL   Monocytes Relative 5 %   Monocytes Absolute 0.3 0.1 - 1.0 K/uL   Eosinophils Relative 2 %   Eosinophils Absolute 0.1 0.0 - 0.7 K/uL   Basophils Relative 0 %   Basophils Absolute 0.0 0.0 - 0.1 K/uL  Basic metabolic panel   Collection Time: 11/27/15  2:22 PM  Result Value Ref Range   Sodium 136  135 - 145 mmol/L   Potassium 3.7 3.5 - 5.1 mmol/L   Chloride 98 (L) 101 - 111 mmol/L   CO2 30 22 - 32 mmol/L   Glucose, Bld 197 (H) 65 - 99 mg/dL   BUN 14 6 - 20 mg/dL   Creatinine, Ser 5.630.98 0.61 - 1.24 mg/dL   Calcium 9.5 8.9 - 87.510.3 mg/dL   GFR calc  non Af Amer >60 >60 mL/min   GFR calc Af Amer >60 >60 mL/min   Anion gap 8 5 - 15  Last HA1c was 8.5  Assessment Axis I Major depressive disorder,  Axis II deferred Axis III see medical history Axis IV moderate  Plan/Discussion: I took his vitals.  I reviewed CC, tobacco/med/surg Hx, meds effects/ side effects, problem list, therapies and responses as well as current situation/symptoms discussed options. The patient will continue Tegretol from mood stabilization, Prozac for depression,  Aricept for memory loss. . Abilify for augmentation and benztropine for side effects from the Abilify. He'll return to see me in 3 months See orders and pt instructions for more details.  MEDICATIONS this encounter: Meds ordered this encounter  Medications  . FLUoxetine (PROZAC) 20 MG capsule    Sig: Take 2 capsules (40 mg total) by mouth daily.    Dispense:  60 capsule    Refill:  2    Take in the am  . carbamazepine (TEGRETOL-XR) 100 MG 12 hr tablet    Sig: Take by mouth 1 twice a day    Dispense:  60 tablet    Refill:  2  . donepezil (ARICEPT) 5 MG tablet    Sig: Take 1 tablet (5 mg total) by mouth at bedtime.    Dispense:  30 tablet    Refill:  2  . ARIPiprazole (ABILIFY) 5 MG tablet    Sig: Take 1 tablet (5 mg total) by mouth every morning.    Dispense:  30 tablet    Refill:  2   Medical Decision Making Problem Points:  Established problem, stable/improving (1), Review of last therapy session (1) and Review of psycho-social stressors (1) Data Points:  Review or order clinical lab tests (1) Review of medication regiment & side effects (2)   I certify that outpatient services furnished can reasonably be expected to improve the  patient's condition.   Diannia Ruder, MD

## 2016-01-04 DIAGNOSIS — Z1389 Encounter for screening for other disorder: Secondary | ICD-10-CM | POA: Diagnosis not present

## 2016-01-04 DIAGNOSIS — F329 Major depressive disorder, single episode, unspecified: Secondary | ICD-10-CM | POA: Diagnosis not present

## 2016-01-04 DIAGNOSIS — Z299 Encounter for prophylactic measures, unspecified: Secondary | ICD-10-CM | POA: Diagnosis not present

## 2016-01-04 DIAGNOSIS — Z0000 Encounter for general adult medical examination without abnormal findings: Secondary | ICD-10-CM | POA: Diagnosis not present

## 2016-01-04 DIAGNOSIS — Z1211 Encounter for screening for malignant neoplasm of colon: Secondary | ICD-10-CM | POA: Diagnosis not present

## 2016-01-04 DIAGNOSIS — Z6822 Body mass index (BMI) 22.0-22.9, adult: Secondary | ICD-10-CM | POA: Diagnosis not present

## 2016-03-28 ENCOUNTER — Ambulatory Visit (HOSPITAL_COMMUNITY): Payer: Self-pay | Admitting: Psychiatry

## 2016-04-09 ENCOUNTER — Encounter (HOSPITAL_COMMUNITY): Payer: Self-pay | Admitting: Psychiatry

## 2016-04-09 ENCOUNTER — Ambulatory Visit (INDEPENDENT_AMBULATORY_CARE_PROVIDER_SITE_OTHER): Payer: BLUE CROSS/BLUE SHIELD | Admitting: Psychiatry

## 2016-04-09 VITALS — BP 119/80 | HR 81 | Ht 70.5 in | Wt 151.0 lb

## 2016-04-09 DIAGNOSIS — Z79899 Other long term (current) drug therapy: Secondary | ICD-10-CM

## 2016-04-09 DIAGNOSIS — F322 Major depressive disorder, single episode, severe without psychotic features: Secondary | ICD-10-CM | POA: Diagnosis not present

## 2016-04-09 DIAGNOSIS — F09 Unspecified mental disorder due to known physiological condition: Secondary | ICD-10-CM

## 2016-04-09 MED ORDER — ARIPIPRAZOLE 5 MG PO TABS: 5.0000 mg | ORAL_TABLET | ORAL | 2 refills | Status: DC

## 2016-04-09 MED ORDER — DONEPEZIL HCL 5 MG PO TABS: 5.0000 mg | ORAL_TABLET | Freq: Every day | ORAL | 2 refills | Status: DC

## 2016-04-09 MED ORDER — CARBAMAZEPINE ER 100 MG PO TB12: ORAL_TABLET | ORAL | 2 refills | Status: DC

## 2016-04-09 MED ORDER — FLUOXETINE HCL 20 MG PO CAPS: 40.0000 mg | ORAL_CAPSULE | Freq: Every day | ORAL | 2 refills | Status: DC

## 2016-04-09 MED ORDER — TRAZODONE HCL 50 MG PO TABS: 50.0000 mg | ORAL_TABLET | Freq: Every day | ORAL | 2 refills | Status: DC

## 2016-04-09 MED ORDER — BENZTROPINE MESYLATE 0.5 MG PO TABS: 0.5000 mg | ORAL_TABLET | Freq: Every day | ORAL | 2 refills | Status: DC

## 2016-04-09 NOTE — Progress Notes (Signed)
Patient ID: Chad Grant, male   DOB: September 20, 1950, 66 y.o.   MRN: [id number] Patient ID: Chad Grant, male   DOB: September 20, 1950, 66 y.o.   MRN: [id number] Patient ID: Chad Grant, male   DOB: September 20, 1950, 66 y.o.   MRN: [id number] Patient ID: Chad Grant, male   DOB: September 20, 1950, 66 y.o.   MRN: [id number] Patient ID: Chad Grant, male   DOB: September 20, 1950, 66 y.o.   MRN: [id number] Patient ID: Chad Grant, male   DOB: September 20, 1950, 66 y.o.   MRN: [id number] Patient ID: Chad Grant, male   DOB: September 20, 1950, 66 y.o.   MRN: [id number] Patient ID: Chad Grant, male   DOB: September 20, 1950, 66 y.o.   MRN: [id number] Patient ID: Chad Grant, male   DOB: September 20, 1950, 66 y.o.   MRN: [id number] Patient ID: Chad Grant, male   DOB: September 20, 1950, 66 y.o.   MRN: [id number] Patient ID: Chad HamperBobby Panchal, male   DOB: September 20, 1950, 66 y.o.   MRN: [id number] Patient ID: Chad HamperBobby Doorn, male   DOB: September 20, 1950, 66 y.o.   MRN: [id number] Patient ID: Chad HamperBobby Georgia, male   DOB: September 20, 1950, 66 y.o.   MRN: [id number] [facility] [id number] Progress Note Chad HamperBobby Kervin MRN: [id number] DOB: September 20, 1950 Age: 66 y.o.  Date: 04/09/2016   Chief Complaint: Chief Complaint  Patient presents with  . Depression  . Memory Loss  . Follow-up   Subjective: "He is doing a little better This patient is a [age]year-old married black male who lives with his wife in [place]. He has 2 children who are grown. 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  Apparently when the patient was in better health and used to work he was a happy go lucky person according to his wife. Since she's had the amputation he has gone downhill. In fact he was hospitalized in June for severe depression. He's been on numerous medications in the past. He used to be angry and aggressive but now seems blunted and shut down. He has no interest in life. He sleeps all day or watches TV. He refuses to bathe which is a big problem for his  wife. He is very defensive and gets angry when I questioned his wife about his status, almost to the point of paranoia. He claims he can't sleep but his wife reports that he sleeps through the day and not at night. He denies any pain.  The patient and wife return after 3 months with his aide, Baldo AshCarl. Baldo AshCarl comes in every day and helps the patient get ready for the day. Sometimes he'll stay at the house for a week at a time when the wife is away on a vacation. They go on outings. The patient seems to be in a better mood and he is benefiting from the company. He states he is eating well and his weight remains at 150 pounds. He seems less depressed and less anxious. He states that he doesn't sleep well at night and is often pacing the floors. The aid states that he often observes this when he is visiting. I suggested we add a low-dose of trazodone to help with sleep. He has not observed any decline in memory   Psychiatric history Patient has been seeing in this office since 2009.  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  Since then patient has been notice very depressed and isolated.    In  the past he had tried Depakote Celexa Xanax temazepam Cymbalta nortriptyline Ritalin however patient either due to side effects or had a poor response with these medication.  Vitals: BP 119/80   Pulse 81   Ht 5' 10.5" (1.791 m)   Wt 151 lb (68.5 kg)   BMI 21.36 kg/m   Allergies: No Known Allergies Medical History: Past Medical History:  Diagnosis Date  . Depression   . Diabetes mellitus type II   . Hypertension   . S/P BKA (below knee amputation) unilateral (HCC)   . Stroke [facility])   Patient has history of CVA with weakness on one side. Patient has diabetes, hypertension and hyperlipidemia. Patient sees Dr. Sherryll Burger in [place].  His last hemoglobin A1c is 8.5.  Surgical History: Past Surgical History:  Procedure Laterality Date  . ABDOMINAL AORTAGRAM N/A 04/29/2012   Procedure:  ABDOMINAL Ronny Flurry;  Surgeon: Nada Libman, MD;  Location: [facility] CATH LAB;  Service: Cardiovascular;  Laterality: N/A;  . CATARACT EXTRACTION W/PHACO Left 12/04/2015   Procedure: CATARACT EXTRACTION PHACO AND INTRAOCULAR LENS PLACEMENT LEFT EYE CDE=19.92;  Surgeon: Gemma Payor, MD;  Location: [facility] ORS;  Service: Ophthalmology;  Laterality: Left;  left  . cataract removed    . left leg amputa    . left leg amputated    . SHOULDER SURGERY     Family History: family history includes Alcohol abuse in his brother; Diabetes in his brother, daughter, father, and sister; Heart disease in his mother; Peripheral vascular disease in his sister. Reviewed in office today and nothing has changed.  Mental status examination Patient is casually dressed and fairly groomed.  He walks slowly with the aid of a walker He was more pleasant and cooperative than he has been in the past He was generally pleasantHe states his depression is better and his affect isbrighter. He denies suicidal ideation or auditory or visual hallucinations. He is jerking his right leg and states "I've had this for a long time." Lab Results:  No results found for this or any previous visit (from the past 2016 hour(s)).Last HA1c was 8.5  Assessment Axis I Major depressive disorder,  Axis II deferred Axis III see medical history Axis IV moderate  Plan/Discussion: I took his vitals.  I reviewed CC, tobacco/med/surg Hx, meds effects/ side effects, problem list, therapies and responses as well as current situation/symptoms discussed options. The patient will continue Tegretol from mood stabilization, Prozac for depression,  Aricept for memory loss. . Abilify for augmentation and benztropine for side effects from the Abilify. We will add trazodone 25-50 g at bedtime to help with sleep He'll return to see me in 3 months See orders and pt instructions for more details.  MEDICATIONS this encounter: Meds ordered this encounter  Medications  .  FLUoxetine (PROZAC) 20 MG capsule    Sig: Take 2 capsules (40 mg total) by mouth daily.    Dispense:  60 capsule    Refill:  2    Take in the am  . donepezil (ARICEPT) 5 MG tablet    Sig: Take 1 tablet (5 mg total) by mouth at bedtime.    Dispense:  30 tablet    Refill:  2  . carbamazepine (TEGRETOL-XR) 100 MG 12 hr tablet    Sig: Take by mouth 1 twice a day    Dispense:  60 tablet    Refill:  2  . benztropine (COGENTIN) 0.5 MG tablet    Sig: Take 1 tablet (0.5 mg total) by mouth  at bedtime.    Dispense:  30 tablet    Refill:  2  . ARIPiprazole (ABILIFY) 5 MG tablet    Sig: Take 1 tablet (5 mg total) by mouth every morning.    Dispense:  30 tablet    Refill:  2  . traZODone (DESYREL) 50 MG tablet    Sig: Take 1 tablet (50 mg total) by mouth at bedtime. Start with half a tablet at bedtime, if no result may go up to whole tablet    Dispense:  30 tablet    Refill:  2   Medical Decision Making Problem Points:  Established problem, stable/improving (1), Review of last therapy session (1) and Review of psycho-social stressors (1) Data Points:  Review or order clinical lab tests (1) Review of medication regiment & side effects (2)   I certify that outpatient services furnished can reasonably be expected to improve the patient's condition.   Diannia Ruder, MD

## 2016-07-10 ENCOUNTER — Encounter (HOSPITAL_COMMUNITY): Payer: Self-pay | Admitting: Psychiatry

## 2016-07-10 ENCOUNTER — Ambulatory Visit (INDEPENDENT_AMBULATORY_CARE_PROVIDER_SITE_OTHER): Payer: BLUE CROSS/BLUE SHIELD | Admitting: Psychiatry

## 2016-07-10 VITALS — BP 110/64 | HR 69 | Ht 70.5 in | Wt 147.0 lb

## 2016-07-10 DIAGNOSIS — F09 Unspecified mental disorder due to known physiological condition: Secondary | ICD-10-CM

## 2016-07-10 DIAGNOSIS — F322 Major depressive disorder, single episode, severe without psychotic features: Secondary | ICD-10-CM

## 2016-07-10 MED ORDER — BENZTROPINE MESYLATE 0.5 MG PO TABS: 0.5000 mg | ORAL_TABLET | Freq: Every day | ORAL | 2 refills | Status: DC

## 2016-07-10 MED ORDER — CARBAMAZEPINE ER 100 MG PO TB12: 100.0000 mg | ORAL_TABLET | Freq: Every day | ORAL | 2 refills | Status: DC

## 2016-07-10 MED ORDER — DONEPEZIL HCL 5 MG PO TABS: 5.0000 mg | ORAL_TABLET | Freq: Every day | ORAL | 2 refills | Status: DC

## 2016-07-10 MED ORDER — FLUOXETINE HCL 20 MG PO CAPS: 40.0000 mg | ORAL_CAPSULE | Freq: Every day | ORAL | 2 refills | Status: DC

## 2016-07-10 MED ORDER — ARIPIPRAZOLE 5 MG PO TABS: 5.0000 mg | ORAL_TABLET | ORAL | 2 refills | Status: DC

## 2016-07-10 MED ORDER — TRAZODONE HCL 50 MG PO TABS: 50.0000 mg | ORAL_TABLET | Freq: Every day | ORAL | 2 refills | Status: DC

## 2016-07-10 NOTE — Progress Notes (Signed)
Patient ID: Chad Grant, male   DOB: Jan 17, 1951, 66 y.o.   MRN: [id number] Patient ID: Chad Grant, male   DOB: [birth year], 66 y.o.   MRN: [id number] Patient ID: Chad Grant, male   DOB: 11/14/1950, 66 y.o.   MRN: [id number] Patient ID: Chad Grant, male   DOB: 1950/07/18, 65 y.o.   MRN: [id number] Patient ID: Chad Grant, male   DOB: 1950/02/25, 66 y.o.   MRN: [id number] Patient ID: Chad Grant, male   DOB: 11-23-1950, 66 y.o.   MRN: [id number] Patient ID: Chad Grant, male   DOB: Feb 27, 1950, 66 y.o.   MRN: [id number] Patient ID: Chad Grant, male   DOB: [birth year], 66 y.o.   MRN: [id number] Patient ID: Chad Grant, male   DOB: 1950/11/01, 66 y.o.   MRN: [id number] Patient ID: Chad Grant, male   DOB: [birth year], 66 y.o.   MRN: [id number] Patient ID: Chad Grant, male   DOB: 11-28-1950, 66 y.o.   MRN: [id number] Patient ID: Chad Grant, male   DOB: [birth year], 66 y.o.   MRN: [id number] Patient ID: Chad Grant, male   DOB: [birth year], 66 y.o.   MRN: [id number] [facility] [id number] Progress Note Chad Grant MRN: [id number] DOB: 09/17/1950 Age: 66 y.o.  Date: 07/10/2016   Chief Complaint: Chief Complaint  Patient presents with  . Depression  . Anxiety  . Follow-up   Subjective: "He is doing a little better This patient is a 66year-old married black male who lives with his wife in [place]. He has 2 children who are grown. 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  Apparently when the patient was in better health and used to work he was a happy go lucky person according to his wife. Since she's had the amputation he has gone downhill. In fact he was hospitalized in June for severe depression. He's been on numerous medications in the past. He used to be angry and aggressive but now seems blunted and shut down. He has no interest in life. He sleeps all day or watches TV. He refuses to bathe which is a big problem for his wife.  He is very defensive and gets angry when I questioned his wife about his status, almost to the point of paranoia. He claims he can't sleep but his wife reports that he sleeps through the day and not at night. He denies any pain.  The patient and wife return after 3 months with his wife. She thinks he is doing a little bit better. She is trying to make him move more and not sleeps much during the day. After next week she will be home full time as she is retiring. She is planning to keep him more active. His hygiene has improved and he is spending more time at his mother's house. She has cut his Tegretol down to once a day and he seems to be more alert. He is a little bit irritable today but not as bad as in the past   Psychiatric history Patient has been seeing in this office since 2009.  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  Since then patient has been notice very depressed and isolated.    In the past he had tried Depakote Celexa Xanax temazepam Cymbalta nortriptyline Ritalin however patient either due to side effects or had a poor response with these medication.  Vitals: BP 110/64  Pulse 69   Ht 5' 10.5" (1.791 m)   Wt 147 lb (66.7 kg)   SpO2 96%   BMI 20.79 kg/m   Allergies: No Known Allergies Medical History: Past Medical History:  Diagnosis Date  . Depression   . Diabetes mellitus type II   . Hypertension   . S/P BKA (below knee amputation) unilateral (HCC)   . Stroke [facility])   Patient has history of CVA with weakness on one side. Patient has diabetes, hypertension and hyperlipidemia. Patient sees Dr. Sherryll Burger in [place].  His last hemoglobin A1c is 8.5.  Surgical History: Past Surgical History:  Procedure Laterality Date  . ABDOMINAL AORTAGRAM N/A 04/29/2012   Procedure: ABDOMINAL Ronny Flurry;  Surgeon: Nada Libman, MD;  [facility] C[facility] LAB;  Service: Cardiovascular;  Laterality: N/A;  . CATARACT EXTRACTION W/PHACO Left 12/04/2015   Procedure:  CATARACT EXTRACTION PHACO AND INTRAOCULAR LENS PLACEMENT LEFT EYE CDE=19.92;  Surgeon: Gemma Payor, MD;  Location: [facility] ORS;  Service: Ophthalmology;  Laterality: Left;  left  . cataract removed    . left leg amputa    . left leg amputated    . SHOULDER SURGERY     Family History: family history includes Alcohol abuse in his brother; Diabetes in his brother, daughter, father, and sister; Heart disease in his mother; Peripheral vascular disease in his sister. Reviewed in office today and nothing has changed.  Mental status examination Patient is casually dressed and fairly groomed.  He walks slowly with the aid of a walker He was more pleasant and cooperative than he has been in the past He is a little withdrawn He states his depression is better and his affect isbrighter. He denies suicidal ideation or auditory or visual hallucinations. He denies any recent changes in memory or cognition. He is sleeping a little bit better with the trazodone Lab Results:  No results found for this or any previous visit (from the past 2016 hour(s)).Last HA1c was 8.5  Assessment Axis I Major depressive disorder,  Axis II deferred Axis III see medical history Axis IV moderate  Plan/Discussion: I took his vitals.  I reviewed CC, tobacco/med/surg Hx, meds effects/ side effects, problem list, therapies and responses as well as current situation/symptoms discussed options. The patient will continue Tegretol from mood stabilization, Prozac for depression,  Aricept for memory loss. . Abilify for augmentation and benztropine for side effects from the Abilify. We will continue trazodone 25-50 g at bedtime to help with sleep He'll return to see me in 3 months See orders and pt instructions for more details.  MEDICATIONS this encounter: Meds ordered this encounter  Medications  . ARIPiprazole (ABILIFY) 5 MG tablet    Sig: Take 1 tablet (5 mg total) by mouth every morning.    Dispense:  30 tablet    Refill:  2  .  benztropine (COGENTIN) 0.5 MG tablet    Sig: Take 1 tablet (0.5 mg total) by mouth at bedtime.    Dispense:  30 tablet    Refill:  2  . carbamazepine (TEGRETOL-XR) 100 MG 12 hr tablet    Sig: Take 1 tablet (100 mg total) by mouth daily.    Dispense:  30 tablet    Refill:  2  . FLUoxetine (PROZAC) 20 MG capsule    Sig: Take 2 capsules (40 mg total) by mouth daily.    Dispense:  60 capsule    Refill:  2    Take in the am  . donepezil (ARICEPT) 5 MG  tablet    Sig: Take 1 tablet (5 mg total) by mouth at bedtime.    Dispense:  30 tablet    Refill:  2   Medical Decision Making Problem Points:  Established problem, stable/improving (1), Review of last therapy session (1) and Review of psycho-social stressors (1) Data Points:  Review or order clinical lab tests (1) Review of medication regiment & side effects (2)   I certify that outpatient services furnished can reasonably be expected to improve the patient's condition.   Diannia RuderOSS, Trinitey Roache, MD

## 2016-08-16 DIAGNOSIS — K529 Noninfective gastroenteritis and colitis, unspecified: Secondary | ICD-10-CM | POA: Diagnosis not present

## 2016-08-16 DIAGNOSIS — F329 Major depressive disorder, single episode, unspecified: Secondary | ICD-10-CM | POA: Diagnosis not present

## 2016-08-16 DIAGNOSIS — I6529 Occlusion and stenosis of unspecified carotid artery: Secondary | ICD-10-CM | POA: Diagnosis not present

## 2016-08-16 DIAGNOSIS — E1151 Type 2 diabetes mellitus with diabetic peripheral angiopathy without gangrene: Secondary | ICD-10-CM | POA: Diagnosis not present

## 2016-08-16 DIAGNOSIS — I10 Essential (primary) hypertension: Secondary | ICD-10-CM | POA: Diagnosis not present

## 2016-08-16 DIAGNOSIS — F1010 Alcohol abuse, uncomplicated: Secondary | ICD-10-CM | POA: Diagnosis not present

## 2016-08-16 DIAGNOSIS — E7800 Pure hypercholesterolemia, unspecified: Secondary | ICD-10-CM | POA: Diagnosis not present

## 2016-08-16 DIAGNOSIS — I739 Peripheral vascular disease, unspecified: Secondary | ICD-10-CM | POA: Diagnosis not present

## 2016-08-16 DIAGNOSIS — Z6822 Body mass index (BMI) 22.0-22.9, adult: Secondary | ICD-10-CM | POA: Diagnosis not present

## 2016-08-16 DIAGNOSIS — Z299 Encounter for prophylactic measures, unspecified: Secondary | ICD-10-CM | POA: Diagnosis not present

## 2016-10-10 ENCOUNTER — Ambulatory Visit (INDEPENDENT_AMBULATORY_CARE_PROVIDER_SITE_OTHER): Payer: PPO | Admitting: Psychiatry

## 2016-10-10 ENCOUNTER — Encounter (HOSPITAL_COMMUNITY): Payer: Self-pay | Admitting: Psychiatry

## 2016-10-10 VITALS — BP 118/66 | HR 74 | Ht 69.5 in | Wt 145.0 lb

## 2016-10-10 DIAGNOSIS — F322 Major depressive disorder, single episode, severe without psychotic features: Secondary | ICD-10-CM | POA: Diagnosis not present

## 2016-10-10 DIAGNOSIS — Z89512 Acquired absence of left leg below knee: Secondary | ICD-10-CM

## 2016-10-10 DIAGNOSIS — Z811 Family history of alcohol abuse and dependence: Secondary | ICD-10-CM

## 2016-10-10 DIAGNOSIS — F09 Unspecified mental disorder due to known physiological condition: Secondary | ICD-10-CM | POA: Diagnosis not present

## 2016-10-10 MED ORDER — CARBAMAZEPINE ER 100 MG PO TB12: 100.0000 mg | ORAL_TABLET | Freq: Every day | ORAL | 2 refills | Status: DC

## 2016-10-10 MED ORDER — FLUOXETINE HCL 20 MG PO CAPS: 40.0000 mg | ORAL_CAPSULE | Freq: Every day | ORAL | 2 refills | Status: DC

## 2016-10-10 MED ORDER — ARIPIPRAZOLE 5 MG PO TABS: 5.0000 mg | ORAL_TABLET | Freq: Every day | ORAL | 2 refills | Status: DC

## 2016-10-10 MED ORDER — TRAZODONE HCL 50 MG PO TABS: 50.0000 mg | ORAL_TABLET | Freq: Every day | ORAL | 2 refills | Status: DC

## 2016-10-10 MED ORDER — BENZTROPINE MESYLATE 0.5 MG PO TABS: 0.5000 mg | ORAL_TABLET | Freq: Every day | ORAL | 2 refills | Status: DC

## 2016-10-10 MED ORDER — DONEPEZIL HCL 5 MG PO TABS: 5.0000 mg | ORAL_TABLET | Freq: Every day | ORAL | 2 refills | Status: DC

## 2016-10-10 NOTE — Progress Notes (Signed)
Patient ID: Chad Grant, male   DOB: 1950-08-29, 66 y.o.   MRN: [id number] Patient ID: Chad Grant, male   DOB: [birth year], 66 y.o.   MRN: [id number] Patient ID: Chad Grant, male   DOB: [birth year], 66 y.o.   MRN: [id number] Patient ID: Chad Grant, male   DOB: [birth year], 66 y.o.   MRN: [id number] Patient ID: Chad Grant, male   DOB: 06-19-1950, 66 y.o.   MRN: [id number] Patient ID: Chad Grant, male   DOB: Aug 13, 1950, 66 y.o.   MRN: [id number] Patient ID: Chad Grant, male   DOB: 05/09/1950, 66 y.o.   MRN: [id number] Patient ID: Chad Grant, male   DOB: Oct 24, 1950, 65 y.o.   MRN: [id number] Patient ID: Chad Grant, male   DOB: 08-Aug-1950, 66 y.o.   MRN: [id number] Patient ID: Chad Grant, male   DOB: 05-20-1950, 66 y.o.   MRN: [id number] Patient ID: Chad Grant, male   DOB: [birth year], 66 y.o.   MRN: [id number] Patient ID: Chad Grant, male   DOB: May 16, 1950, 66 y.o.   MRN: [id number] Patient ID: Chad Grant, male   DOB: Jun 20, 1950, 66 y.o.   MRN: [id number] [facility] [id number] Progress Note Chad Grant MRN: [id number] DOB: [birth year] Age: 66 y.o.  Date: 10/10/2016   Chief Complaint: Chief Complaint  Patient presents with  . Follow-up  . Depression  . Memory Loss   Subjective: "He is doing a little better This patient is a 66year-old married black male who lives with his wife in [place]. He has 2 children who are grown. 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  Apparently when the patient was in better health and used to work he was a happy go lucky person according to his wife. Since she's had the amputation he has gone downhill. In fact he was hospitalized in June for severe depression. He's been on numerous medications in the past. He used to be angry and aggressive but now seems blunted and shut down. He has no interest in life. He sleeps all day or watches TV. He refuses to bathe which is a big problem for his  wife. He is very defensive and gets angry when I questioned his wife about his status, almost to the point of paranoia. He claims he can't sleep but his wife reports that he sleeps through the day and not at night. He denies any pain.  The patient and wife return after 3 months with his wife. She is now retired and is able to supervise his care little bit more. She's making sure his hygiene is taking care of and that he is getting up and dressed and doing a little bit more walking. He still is a bit negative but brighter than he has been in the past. He falls asleep easily as soon as he sits down during the day so I think we need to move his Abilify to bedtime   Psychiatric history Patient has been seeing in this office since 2009.  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  Since then patient has been notice very depressed and isolated.    In the past he had tried Depakote Celexa Xanax temazepam Cymbalta nortriptyline Ritalin however patient either due to side effects or had a poor response with these medication.  Vitals: BP 118/66 (BP Location: Right Arm, Patient Position: Sitting, Cuff Size: Normal)   Pulse 74  Ht 5' 9.5" (1.765 m)   Wt 145 lb (65.8 kg)   BMI 21.11 kg/m   Allergies: No Known Allergies Medical History: Past Medical History:  Diagnosis Date  . Depression   . Diabetes mellitus type II   . Hypertension   . S/P BKA (below knee amputation) unilateral (HCC)   . Stroke [facility])   Patient has history of CVA with weakness on one side. Patient has diabetes, hypertension and hyperlipidemia. Patient sees Dr. Sherryll Burger in [place].  His last hemoglobin A1c is 8.5.  Surgical History: Past Surgical History:  Procedure Laterality Date  . ABDOMINAL AORTAGRAM N/A 04/29/2012   Procedure: ABDOMINAL Ronny Flurry;  Surgeon: Nada Libman, MD;  Location: [facility] CATH LAB;  Service: Cardiovascular;  Laterality: N/A;  . CATARACT EXTRACTION W/PHACO Left 12/04/2015   Procedure:  CATARACT EXTRACTION PHACO AND INTRAOCULAR LENS PLACEMENT LEFT EYE CDE=19.92;  Surgeon: Gemma Payor, MD;  Location: [facility] ORS;  Service: Ophthalmology;  Laterality: Left;  left  . cataract removed    . left leg amputa    . left leg amputated    . SHOULDER SURGERY     Family History: family history includes Alcohol abuse in his brother; Diabetes in his brother, daughter, father, and sister; Heart disease in his mother; Peripheral vascular disease in his sister. Reviewed in office today and nothing has changed.  Mental status examination Patient is casually dressed and fairly groomed.  He walks slowly with the aid of a walker He was more pleasant and cooperative than he has been in the past He is a little withdrawn He states his depression is better and his affect isbrighter. He denies suicidal ideation or auditory or visual hallucinations. He denies any recent changes in memory or cognition. He is sleeping a little bit better with the trazodone Lab Results:  No results found for this or any previous visit (from the past 2016 hour(s)).Last HA1c was 8.5  Assessment Axis I Major depressive disorder,  Axis II deferred Axis III see medical history Axis IV moderate  Plan/Discussion: I took his vitals.  I reviewed CC, tobacco/med/surg Hx, meds effects/ side effects, problem list, therapies and responses as well as current situation/symptoms discussed options. The patient will continue Tegretol from mood stabilization, Prozac for depression,  Aricept for memory loss. . Abilify for augmentation and benztropine for side effects from the Abilify he's been instructed to take the Abilify at bedtime. We will continue trazodone 25-50 g at bedtime to help with sleep He'll return to see me in 3 months See orders and pt instructions for more details.  MEDICATIONS this encounter: Meds ordered this encounter  Medications  . ARIPiprazole (ABILIFY) 5 MG tablet    Sig: Take 1 tablet (5 mg total) by mouth at  bedtime.    Dispense:  30 tablet    Refill:  2  . traZODone (DESYREL) 50 MG tablet    Sig: Take 1 tablet (50 mg total) by mouth at bedtime.    Dispense:  30 tablet    Refill:  2  . FLUoxetine (PROZAC) 20 MG capsule    Sig: Take 2 capsules (40 mg total) by mouth daily.    Dispense:  60 capsule    Refill:  2    Take in the am  . benztropine (COGENTIN) 0.5 MG tablet    Sig: Take 1 tablet (0.5 mg total) by mouth at bedtime.    Dispense:  30 tablet    Refill:  2  . carbamazepine (TEGRETOL-XR) 100 MG  12 hr tablet    Sig: Take 1 tablet (100 mg total) by mouth daily.    Dispense:  30 tablet    Refill:  2  . donepezil (ARICEPT) 5 MG tablet    Sig: Take 1 tablet (5 mg total) by mouth at bedtime.    Dispense:  30 tablet    Refill:  2   Medical Decision Making Problem Points:  Established problem, stable/improving (1), Review of last therapy session (1) and Review of psycho-social stressors (1) Data Points:  Review or order clinical lab tests (1) Review of medication regiment & side effects (2)   I certify that outpatient services furnished can reasonably be expected to improve the patient's condition.   Diannia Ruder, MD

## 2016-10-18 DIAGNOSIS — F329 Major depressive disorder, single episode, unspecified: Secondary | ICD-10-CM | POA: Diagnosis not present

## 2016-10-18 DIAGNOSIS — E1151 Type 2 diabetes mellitus with diabetic peripheral angiopathy without gangrene: Secondary | ICD-10-CM | POA: Diagnosis not present

## 2016-10-18 DIAGNOSIS — Z299 Encounter for prophylactic measures, unspecified: Secondary | ICD-10-CM | POA: Diagnosis not present

## 2016-10-18 DIAGNOSIS — I6529 Occlusion and stenosis of unspecified carotid artery: Secondary | ICD-10-CM | POA: Diagnosis not present

## 2016-10-18 DIAGNOSIS — S98131A Complete traumatic amputation of one right lesser toe, initial encounter: Secondary | ICD-10-CM | POA: Diagnosis not present

## 2016-10-18 DIAGNOSIS — Z89512 Acquired absence of left leg below knee: Secondary | ICD-10-CM | POA: Diagnosis not present

## 2016-10-18 DIAGNOSIS — I739 Peripheral vascular disease, unspecified: Secondary | ICD-10-CM | POA: Diagnosis not present

## 2016-10-18 DIAGNOSIS — E1165 Type 2 diabetes mellitus with hyperglycemia: Secondary | ICD-10-CM | POA: Diagnosis not present

## 2016-10-18 DIAGNOSIS — I10 Essential (primary) hypertension: Secondary | ICD-10-CM | POA: Diagnosis not present

## 2016-10-18 DIAGNOSIS — Z6822 Body mass index (BMI) 22.0-22.9, adult: Secondary | ICD-10-CM | POA: Diagnosis not present

## 2016-10-24 ENCOUNTER — Telehealth (HOSPITAL_COMMUNITY): Payer: Self-pay | Admitting: *Deleted

## 2016-10-24 NOTE — Telephone Encounter (Signed)
Office received fax from pt pharmacy to complete PA for pt medication with Cover My Meds. Key code is [id number]. Fax did not have the name of the medication that needed PA.

## 2016-10-29 ENCOUNTER — Telehealth (HOSPITAL_COMMUNITY): Payer: Self-pay | Admitting: *Deleted

## 2016-10-29 NOTE — Telephone Encounter (Signed)
Attempted to do PA online with cover my meds for Tegretol XR. Was unable to complete with message given  "couldn't find any matching drug in PA hub for [facility] code."

## 2016-11-11 NOTE — Telephone Encounter (Signed)
noted 

## 2016-11-26 DIAGNOSIS — Z299 Encounter for prophylactic measures, unspecified: Secondary | ICD-10-CM | POA: Diagnosis not present

## 2016-11-26 DIAGNOSIS — M545 Low back pain: Secondary | ICD-10-CM | POA: Diagnosis not present

## 2016-11-26 DIAGNOSIS — E1165 Type 2 diabetes mellitus with hyperglycemia: Secondary | ICD-10-CM | POA: Diagnosis not present

## 2016-11-26 DIAGNOSIS — Z6822 Body mass index (BMI) 22.0-22.9, adult: Secondary | ICD-10-CM | POA: Diagnosis not present

## 2016-11-26 DIAGNOSIS — E1151 Type 2 diabetes mellitus with diabetic peripheral angiopathy without gangrene: Secondary | ICD-10-CM | POA: Diagnosis not present

## 2016-11-26 DIAGNOSIS — I739 Peripheral vascular disease, unspecified: Secondary | ICD-10-CM | POA: Diagnosis not present

## 2016-11-26 DIAGNOSIS — R197 Diarrhea, unspecified: Secondary | ICD-10-CM | POA: Diagnosis not present

## 2017-01-09 ENCOUNTER — Ambulatory Visit (HOSPITAL_COMMUNITY): Payer: PPO | Admitting: Psychiatry

## 2017-02-03 ENCOUNTER — Encounter (HOSPITAL_COMMUNITY): Payer: Self-pay | Admitting: Psychiatry

## 2017-02-03 ENCOUNTER — Ambulatory Visit (INDEPENDENT_AMBULATORY_CARE_PROVIDER_SITE_OTHER): Payer: PPO | Admitting: Psychiatry

## 2017-02-03 VITALS — BP 121/66 | HR 62 | Ht 68.5 in | Wt 150.0 lb

## 2017-02-03 DIAGNOSIS — Z299 Encounter for prophylactic measures, unspecified: Secondary | ICD-10-CM | POA: Diagnosis not present

## 2017-02-03 DIAGNOSIS — Z811 Family history of alcohol abuse and dependence: Secondary | ICD-10-CM | POA: Diagnosis not present

## 2017-02-03 DIAGNOSIS — M549 Dorsalgia, unspecified: Secondary | ICD-10-CM

## 2017-02-03 DIAGNOSIS — Z1211 Encounter for screening for malignant neoplasm of colon: Secondary | ICD-10-CM | POA: Diagnosis not present

## 2017-02-03 DIAGNOSIS — F0390 Unspecified dementia without behavioral disturbance: Secondary | ICD-10-CM | POA: Diagnosis not present

## 2017-02-03 DIAGNOSIS — F329 Major depressive disorder, single episode, unspecified: Secondary | ICD-10-CM | POA: Diagnosis not present

## 2017-02-03 DIAGNOSIS — Z1339 Encounter for screening examination for other mental health and behavioral disorders: Secondary | ICD-10-CM | POA: Diagnosis not present

## 2017-02-03 DIAGNOSIS — Z0000 Encounter for general adult medical examination without abnormal findings: Secondary | ICD-10-CM | POA: Diagnosis not present

## 2017-02-03 DIAGNOSIS — F09 Unspecified mental disorder due to known physiological condition: Secondary | ICD-10-CM | POA: Diagnosis not present

## 2017-02-03 DIAGNOSIS — Z89512 Acquired absence of left leg below knee: Secondary | ICD-10-CM | POA: Diagnosis not present

## 2017-02-03 DIAGNOSIS — R413 Other amnesia: Secondary | ICD-10-CM | POA: Diagnosis not present

## 2017-02-03 DIAGNOSIS — F322 Major depressive disorder, single episode, severe without psychotic features: Secondary | ICD-10-CM

## 2017-02-03 DIAGNOSIS — E7800 Pure hypercholesterolemia, unspecified: Secondary | ICD-10-CM | POA: Diagnosis not present

## 2017-02-03 DIAGNOSIS — Z6821 Body mass index (BMI) 21.0-21.9, adult: Secondary | ICD-10-CM | POA: Diagnosis not present

## 2017-02-03 DIAGNOSIS — E1165 Type 2 diabetes mellitus with hyperglycemia: Secondary | ICD-10-CM | POA: Diagnosis not present

## 2017-02-03 DIAGNOSIS — Z1331 Encounter for screening for depression: Secondary | ICD-10-CM | POA: Diagnosis not present

## 2017-02-03 DIAGNOSIS — Z125 Encounter for screening for malignant neoplasm of prostate: Secondary | ICD-10-CM | POA: Diagnosis not present

## 2017-02-03 DIAGNOSIS — Z79899 Other long term (current) drug therapy: Secondary | ICD-10-CM | POA: Diagnosis not present

## 2017-02-03 DIAGNOSIS — Z7189 Other specified counseling: Secondary | ICD-10-CM | POA: Diagnosis not present

## 2017-02-03 DIAGNOSIS — I10 Essential (primary) hypertension: Secondary | ICD-10-CM | POA: Diagnosis not present

## 2017-02-03 DIAGNOSIS — R5383 Other fatigue: Secondary | ICD-10-CM | POA: Diagnosis not present

## 2017-02-03 MED ORDER — ARIPIPRAZOLE 5 MG PO TABS: 5.0000 mg | ORAL_TABLET | Freq: Every day | ORAL | 2 refills | Status: DC

## 2017-02-03 MED ORDER — DONEPEZIL HCL 5 MG PO TABS: 5.0000 mg | ORAL_TABLET | Freq: Every day | ORAL | 2 refills | Status: DC

## 2017-02-03 MED ORDER — TRAZODONE HCL 50 MG PO TABS: 50.0000 mg | ORAL_TABLET | Freq: Every day | ORAL | 2 refills | Status: DC

## 2017-02-03 MED ORDER — BENZTROPINE MESYLATE 0.5 MG PO TABS: 0.5000 mg | ORAL_TABLET | Freq: Every day | ORAL | 2 refills | Status: DC

## 2017-02-03 MED ORDER — FLUOXETINE HCL 20 MG PO CAPS: 40.0000 mg | ORAL_CAPSULE | Freq: Every day | ORAL | 2 refills | Status: DC

## 2017-02-03 MED ORDER — CARBAMAZEPINE ER 100 MG PO TB12: 100.0000 mg | ORAL_TABLET | Freq: Every day | ORAL | 2 refills | Status: DC

## 2017-02-03 NOTE — Progress Notes (Signed)
BH MD/PA/NP OP Progress Note  02/03/2017 3:24 PM Chad Grant  MRN:  [id number]  Chief Complaint:  Chief Complaint    Depression; Follow-up     HPI: This patient is a 67year-old married black male who lives with his wife in [place]. He has 2 children who are grown. 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  Apparently when the patient was in better health and used to work he was a happy go lucky person according to his wife. Since she's had the amputation he has gone downhill. In fact he was hospitalized in June for severe depression. He's been on numerous medications in the past. He used to be angry and aggressive but now seems blunted and shut down. He has no interest in life. He sleeps all day or watches TV. He refuses to bathe which is a big problem for his wife. He is very defensive and gets angry when I questioned his wife about his status, almost to the point of paranoia. He claims he can't sleep but his wife reports that he sleeps through the day and not at night. He denies any pain.  The patient returns after 4 months with his wife.  He seems drowsy and withdrawn today.  The wife states that he had to have blood work this morning so she gave him an extra melatonin last night to make sure he would sleep through the night and not get up and eat.  He saw his primary physician, Dr. Sherryll Burger this morning and had a good physical and his lab work looked good per the wife.  She claims that he is doing more and is more alert but he certainly does not look that way today.  He denies being depressed or suicidal.  She thinks his memory loss is actually a little bit better.  She is now able to spend most of her time with him since she is no longer working.  His hygiene has improved with her direction. Visit Diagnosis:    ICD-10-CM   1. Cognitive disorder F09   2. Severe single current episode of major depressive disorder, without  psychotic features (HCC) F32.2 carbamazepine (TEGRETOL-XR) 100 MG 12 hr tablet    Past Psychiatric History: The patient has been seen in this office since 2009.  He has been on numerous mood stabilizers and antidepressants most of which have not been helpful.  Past Medical History:  Past Medical History:  Diagnosis Date  . Depression   . Diabetes mellitus type II   . Hypertension   . S/P BKA (below knee amputation) unilateral (HCC)   . Stroke [facility])     Past Surgical History:  Procedure Laterality Date  . ABDOMINAL AORTAGRAM N/A 04/29/2012   Procedure: ABDOMINAL Ronny Flurry;  Surgeon: Nada Libman, MD;  Location: [facility] CATH LAB;  Service: Cardiovascular;  Laterality: N/A;  . CATARACT EXTRACTION W/PHACO Left 12/04/2015   Procedure: CATARACT EXTRACTION PHACO AND INTRAOCULAR LENS PLACEMENT LEFT EYE CDE=19.92;  Surgeon: Gemma Payor, MD;  Location: [facility]S;  Service: Ophthalmology;  Laterality: Left;  left  . cataract removed    . left leg amputa    . left leg amputated    . SHOULDER SURGERY      Family Psychiatric History: None  Family History:  Family History  Problem Relation Age of Onset  . Heart disease Mother   . Diabetes Father   .  Alcohol abuse Brother   . Diabetes Brother   . Diabetes Sister   . Peripheral vascular disease Sister        amputation  . Diabetes Daughter   . Anxiety disorder Neg Hx   . Bipolar disorder Neg Hx   . Dementia Neg Hx   . Depression Neg Hx   . Drug abuse Neg Hx   . Paranoid behavior Neg Hx   . Schizophrenia Neg Hx   . OCD Neg Hx   . Seizures Neg Hx   . Sexual abuse Neg Hx   . Physical abuse Neg Hx   . ADD / ADHD Neg Hx     Social History:  Social History   Socioeconomic History  . Marital status: Married    Spouse name: None  . Number of children: None  . Years of education: None  . Highest education level: None  Social Needs  . Financial resource strain: None  . Food insecurity - worry: None  . Food insecurity - inability: None   . Transportation needs - medical: None  . Transportation needs - non-medical: None  Occupational History  . None  Tobacco Use  . Smoking status: Never Smoker  . Smokeless tobacco: Never Used  Substance and Sexual Activity  . Alcohol use: No  . Drug use: No  . Sexual activity: Not Currently  Other Topics Concern  . None  Social History Narrative  . None    Allergies: No Known Allergies  Metabolic Disorder Labs: Lab Results  Component Value Date   HGBA1C (H) 08/14/2007    6.3 (NOTE)   The ADA recommends the following therapeutic goal for glycemic   control related to Hgb A1C measurement:   Goal of Therapy:   < 7.0% Hgb A1C   Reference: American Diabetes Association: Clinical Practice   Recommendations 2008, Diabetes Care,  2008, 31:(Suppl 1).   MPG 147 08/14/2007   No results found for: PROLACTIN No results found for: CHOL, TRIG, HDL, CHOLHDL, VLDL, LDLCALC No results found for: TSH  Therapeutic Level Labs: No results found for: LITHIUM No results found for: VALPROATE No components found for:  CBMZ  Current Medications: Current Outpatient Medications  Medication Sig Dispense Refill  . ARIPiprazole (ABILIFY) 5 MG tablet Take 1 tablet (5 mg total) by mouth at bedtime. 30 tablet 2  . benztropine (COGENTIN) 0.5 MG tablet Take 1 tablet (0.5 mg total) by mouth at bedtime. 30 tablet 2  . carbamazepine (TEGRETOL-XR) 100 MG 12 hr tablet Take 1 tablet (100 mg total) by mouth daily. 30 tablet 2  . clopidogrel (PLAVIX) 75 MG tablet Take 1 tablet (75 mg total) by mouth daily.    Marland Kitchen donepezil (ARICEPT) 5 MG tablet Take 1 tablet (5 mg total) by mouth at bedtime. 30 tablet 2  . FLUoxetine (PROZAC) 20 MG capsule Take 2 capsules (40 mg total) by mouth daily. 60 capsule 2  . metFORMIN (GLUCOPHAGE) 500 MG tablet Take 1 tablet (500 mg total) by mouth 2 (two) times daily with a meal.    . metoprolol tartrate (LOPRESSOR) 25 MG tablet Take 1 tablet (25 mg total) by mouth 2 (two) times daily.  (Patient taking differently: Take 25 mg by mouth daily at 3 pm. )    . simvastatin (ZOCOR) 40 MG tablet Take 40 mg by mouth every evening.    . sitaGLIPtin (JANUVIA) 100 MG tablet Take 1 tablet (100 mg total) by mouth daily.    . traZODone (DESYREL) 50 MG tablet  Take 1 tablet (50 mg total) by mouth at bedtime. 30 tablet 2   No current facility-administered medications for this visit.      Musculoskeletal: Strength & Muscle Tone: decreased Gait & Station: unsteady Patient leans: N/A  Psychiatric Specialty Exam: Review of Systems  Constitutional: Positive for malaise/fatigue.  Musculoskeletal: Positive for back pain.  Neurological: Positive for focal weakness.  Psychiatric/Behavioral: Positive for memory loss.  All other systems reviewed and are negative.   Blood pressure 121/66, pulse 62, height 5' 8.5" (1.74 m), weight 150 lb (68 kg), SpO2 100 %.Body mass index is 22.48 kg/m.  General Appearance: Casual and Fairly Groomed  Eye Contact:  Minimal  Speech:  Slow  Volume:  Decreased  Mood:  Dysphoric and Irritable  Affect:  Constricted and Flat  Thought Process:  Goal Directed  Orientation:  Full (Time, Place, and Person)  Thought Content: Rumination   Suicidal Thoughts:  No  Homicidal Thoughts:  No  Memory:  Immediate;   Fair Recent;   Poor Remote;   Poor  Judgement:  Impaired  Insight:  Lacking  Psychomotor Activity:  Decreased  Concentration:  Concentration: Poor and Attention Span: Poor  Recall:  Poor  Fund of Knowledge: Fair  Language: Good  Akathisia:  No  Handed:  Right  AIMS (if indicated): not done  Assets:  Communication Skills Resilience Social Support  ADL's:  Impaired  Cognition: Impaired,  Mild  Sleep:  Good   Screenings: AUDIT     [facility]  Alcohol Use Disorder Identification Test Final Score (AUDIT)  0       Assessment and Plan: This patient is a 67 year old male  with a history of depression and now mild dementia.  His decline happened after below the knee amputation of his left leg several years ago.  He certainly does not look very engaged today but his wife thinks he may be over sedated from the medication she gave him for sleep.  Overall she claims he is doing better.  He will continue Abilify 5 mg daily for mood stabilization, Prozac to 40 mg daily for depression, Aricept 5 mg daily for memory loss, Tegretol-XR 100 mg daily for mood stabilization Cogentin 0.5 mg daily to prevent tardive dyskinesia and trazodone 50 mg at bedtime as needed for sleep.  He is now taking all of his sedating medications at bedtime.  He will return to see me in 4 months   Diannia Rudereborah Ross, MD 02/03/2017, 3:24 PM

## 2017-04-25 DIAGNOSIS — R197 Diarrhea, unspecified: Secondary | ICD-10-CM | POA: Diagnosis not present

## 2017-04-25 DIAGNOSIS — E1165 Type 2 diabetes mellitus with hyperglycemia: Secondary | ICD-10-CM | POA: Diagnosis not present

## 2017-04-25 DIAGNOSIS — Z6822 Body mass index (BMI) 22.0-22.9, adult: Secondary | ICD-10-CM | POA: Diagnosis not present

## 2017-04-25 DIAGNOSIS — K219 Gastro-esophageal reflux disease without esophagitis: Secondary | ICD-10-CM | POA: Diagnosis not present

## 2017-04-25 DIAGNOSIS — I10 Essential (primary) hypertension: Secondary | ICD-10-CM | POA: Diagnosis not present

## 2017-04-25 DIAGNOSIS — Z299 Encounter for prophylactic measures, unspecified: Secondary | ICD-10-CM | POA: Diagnosis not present

## 2017-05-21 DIAGNOSIS — I10 Essential (primary) hypertension: Secondary | ICD-10-CM | POA: Diagnosis not present

## 2017-05-21 DIAGNOSIS — Z789 Other specified health status: Secondary | ICD-10-CM | POA: Diagnosis not present

## 2017-05-21 DIAGNOSIS — E1165 Type 2 diabetes mellitus with hyperglycemia: Secondary | ICD-10-CM | POA: Diagnosis not present

## 2017-05-21 DIAGNOSIS — Z299 Encounter for prophylactic measures, unspecified: Secondary | ICD-10-CM | POA: Diagnosis not present

## 2017-05-21 DIAGNOSIS — E1151 Type 2 diabetes mellitus with diabetic peripheral angiopathy without gangrene: Secondary | ICD-10-CM | POA: Diagnosis not present

## 2017-05-21 DIAGNOSIS — I739 Peripheral vascular disease, unspecified: Secondary | ICD-10-CM | POA: Diagnosis not present

## 2017-05-21 DIAGNOSIS — Z6822 Body mass index (BMI) 22.0-22.9, adult: Secondary | ICD-10-CM | POA: Diagnosis not present

## 2017-06-03 ENCOUNTER — Encounter (HOSPITAL_COMMUNITY): Payer: Self-pay | Admitting: Psychiatry

## 2017-06-03 ENCOUNTER — Ambulatory Visit (INDEPENDENT_AMBULATORY_CARE_PROVIDER_SITE_OTHER): Payer: PPO | Admitting: Psychiatry

## 2017-06-03 VITALS — BP 125/73 | HR 60 | Ht 68.5 in | Wt 151.0 lb

## 2017-06-03 DIAGNOSIS — R413 Other amnesia: Secondary | ICD-10-CM

## 2017-06-03 DIAGNOSIS — Z811 Family history of alcohol abuse and dependence: Secondary | ICD-10-CM

## 2017-06-03 DIAGNOSIS — F322 Major depressive disorder, single episode, severe without psychotic features: Secondary | ICD-10-CM | POA: Diagnosis not present

## 2017-06-03 DIAGNOSIS — F09 Unspecified mental disorder due to known physiological condition: Secondary | ICD-10-CM

## 2017-06-03 DIAGNOSIS — M2550 Pain in unspecified joint: Secondary | ICD-10-CM

## 2017-06-03 MED ORDER — FLUOXETINE HCL 20 MG PO CAPS: 40.0000 mg | ORAL_CAPSULE | Freq: Every day | ORAL | 2 refills | Status: DC

## 2017-06-03 MED ORDER — DONEPEZIL HCL 5 MG PO TABS: 5.0000 mg | ORAL_TABLET | Freq: Every day | ORAL | 2 refills | Status: DC

## 2017-06-03 MED ORDER — ARIPIPRAZOLE 5 MG PO TABS: 5.0000 mg | ORAL_TABLET | Freq: Every day | ORAL | 2 refills | Status: DC

## 2017-06-03 MED ORDER — TRAZODONE HCL 50 MG PO TABS: 50.0000 mg | ORAL_TABLET | Freq: Every day | ORAL | 2 refills | Status: DC

## 2017-06-03 NOTE — Progress Notes (Signed)
BH MD/PA/NP OP Progress Note  06/03/2017 2:56 PM Chad Grant  MRN:  [id number]  Chief Complaint:  Chief Complaint    Depression; Anxiety; Follow-up     HPI: This patient is a 67year-old married black male who lives with his wife in [place]. He has 68 children who are grown. 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  Apparently when the patient was in better health and used to work he was a happy go lucky person according to his wife. Since she's had the amputation he has gone downhill. In fact he was hospitalized in June for severe depression. He's been on numerous medications in the past. He used to be angry and aggressive but now seems blunted and shut down. He has no interest in life. He sleeps all day or watches TV. He refuses to bathe which is a big problem for his wife. He is very defensive and gets angry when I questioned his wife about his status, almost to the point of paranoia. He claims he can't sleep but his wife reports that he sleeps through the day and not at night. He denies any pain.  The patient and wife return after 4 months.  The wife mentions that she has stopped his carbamazepine because insurance would not cover it and she thought it was causing more irritability.  She thinks is a little bit less irritable since he has been off of it.  He still taps his feet quite a bit and going on Cogentin did not help and since this can cause more confusion we might as will stop this as well.  He still says very little but he looks less drowsy and more engaged.  The wife states that she is kept him on more of a routine and kept him out of the bed during the day.  Generally he sleeps well at night.  She thinks his memory is somewhat improved but he has very little to say today   Visit Diagnosis:    ICD-10-CM   1. Severe single current episode of major depressive disorder, without psychotic features (HCC) F32.2   2.  Cognitive disorder F09     Past Psychiatric History: Patient has been seen in this office since 2009.  He has been on numerous mood stabilizers and antidepressants, most of which have not been helpful  Past Medical History:  Past Medical History:  Diagnosis Date  . Depression   . Diabetes mellitus type II   . Hypertension   . S/P BKA (below knee amputation) unilateral (HCC)   . Stroke [facility])     Past Surgical History:  Procedure Laterality Date  . ABDOMINAL AORTAGRAM N/A 04/29/2012   Procedure: ABDOMINAL Ronny Flurry;  Surgeon: Nada Libman, MD;  Location: [facility] CATH LAB;  Service: Cardiovascular;  Laterality: N/A;  . CATARACT EXTRACTION W/PHACO Left 12/04/2015   Procedure: CATARACT EXTRACTION PHACO AND INTRAOCULAR LENS PLACEMENT LEFT EYE CDE=19.92;  Surgeon: Gemma Payor, MD;  Location: [facility];  Service: Ophthalmology;  Laterality: Left;  left  . cataract removed    . left leg amputa    . left leg amputated    . SHOULDER SURGERY      Family Psychiatric History: None  Family History:  Family History  Problem Relation Age of Onset  . Heart disease Mother   . Diabetes Father   . Alcohol abuse Brother   . Diabetes Brother   .  Diabetes Sister   . Peripheral vascular disease Sister        amputation  . Diabetes Daughter   . Anxiety disorder Neg Hx   . Bipolar disorder Neg Hx   . Dementia Neg Hx   . Depression Neg Hx   . Drug abuse Neg Hx   . Paranoid behavior Neg Hx   . Schizophrenia Neg Hx   . OCD Neg Hx   . Seizures Neg Hx   . Sexual abuse Neg Hx   . Physical abuse Neg Hx   . ADD / ADHD Neg Hx     Social History:  Social History   Socioeconomic History  . Marital status: Married    Spouse name: Not on file  . Number of children: Not on file  . Years of education: Not on file  . Highest education level: Not on file  Occupational History  . Not on file  Social Needs  . Financial resource strain: Not on file  . Food insecurity:    Worry: Not on file    Inability:  Not on file  . Transportation needs:    Medical: Not on file    Non-medical: Not on file  Tobacco Use  . Smoking status: Never Smoker  . Smokeless tobacco: Never Used  Substance and Sexual Activity  . Alcohol use: No  . Drug use: No  . Sexual activity: Not Currently  Lifestyle  . Physical activity:    Days per week: Not on file    Minutes per session: Not on file  . Stress: Not on file  Relationships  . Social connections:    Talks on phone: Not on file    Gets together: Not on file    Attends religious service: Not on file    Active member of club or organization: Not on file    Attends meetings of clubs or organizations: Not on file    Relationship status: Not on file  Other Topics Concern  . Not on file  Social History Narrative  . Not on file    Allergies: No Known Allergies  Metabolic Disorder Labs: Lab Results  Component Value Date   HGBA1C (H) 08/14/2007    6.3 (NOTE)   The ADA recommends the following therapeutic goal for glycemic   control related to Hgb A1C measurement:   Goal of Therapy:   < 7.0% Hgb A1C   Reference: American Diabetes Association: Clinical Practice   Recommendations 2008, Diabetes Care,  2008, 31:(Suppl 1).   MPG 147 08/14/2007   No results found for: PROLACTIN No results found for: CHOL, TRIG, HDL, CHOLHDL, VLDL, LDLCALC No results found for: TSH  Therapeutic Level Labs: No results found for: LITHIUM No results found for: VALPROATE No components found for:  CBMZ  Current Medications: Current Outpatient Medications  Medication Sig Dispense Refill  . ARIPiprazole (ABILIFY) 5 MG tablet Take 1 tablet (5 mg total) by mouth at bedtime. 90 tablet 2  . clopidogrel (PLAVIX) 75 MG tablet Take 1 tablet (75 mg total) by mouth daily.    Marland Kitchen donepezil (ARICEPT) 5 MG tablet Take 1 tablet (5 mg total) by mouth at bedtime. 90 tablet 2  . FLUoxetine (PROZAC) 20 MG capsule Take 2 capsules (40 mg total) by mouth daily. 180 capsule 2  . metFORMIN  (GLUCOPHAGE) 500 MG tablet Take 1 tablet (500 mg total) by mouth 2 (two) times daily with a meal.    . metoprolol tartrate (LOPRESSOR) 25 MG tablet Take 1  tablet (25 mg total) by mouth 2 (two) times daily. (Patient taking differently: Take 25 mg by mouth daily at 3 pm. )    . simvastatin (ZOCOR) 40 MG tablet Take 40 mg by mouth every evening.    . sitaGLIPtin (JANUVIA) 100 MG tablet Take 1 tablet (100 mg total) by mouth daily.    . traZODone (DESYREL) 50 MG tablet Take 1 tablet (50 mg total) by mouth at bedtime. 90 tablet 2   No current facility-administered medications for this visit.      Musculoskeletal: Strength & Muscle Tone: decreased Gait & Station: unsteady Patient leans: N/A  Psychiatric Specialty Exam: Review of Systems  Constitutional: Positive for malaise/fatigue.  Musculoskeletal: Positive for joint pain.  Psychiatric/Behavioral: Positive for memory loss.  All other systems reviewed and are negative.   Blood pressure 125/73, pulse 60, height 5' 8.5" (1.74 m), weight 151 lb (68.5 kg), SpO2 99 %.Body mass index is 22.63 kg/m.  General Appearance: Casual and Fairly Groomed  Eye Contact:  Poor  Speech:  Slow  Volume:  Decreased  Mood:  Irritable  Affect:  Flat  Thought Process:  Goal Directed  Orientation:  Full (Time, Place, and Person)  Thought Content: Negative   Suicidal Thoughts:  No  Homicidal Thoughts:  No  Memory:  Immediate;   Poor Recent;   Poor Remote;   Poor  Judgement:  Impaired  Insight:  Lacking  Psychomotor Activity:  Decreased  Concentration:  Concentration: Poor and Attention Span: Poor  Recall:  Poor  Fund of Knowledge: Fair  Language: Fair  Akathisia:  No  Handed:  Right  AIMS (if indicated): not done  Assets:  Resilience Social Support  ADL's:  Impaired  Cognition: Impaired,  Mild  Sleep:  Good   Screenings: AUDIT     [facility]  Alcohol Use Disorder  Identification Test Final Score (AUDIT)  0       Assessment and Plan: Patient is a 67 year old male with a history of depression and what seems to be mild dementia.  He is doing better now that his wife is providing more structure to his day.  His hygiene has improved as well.  He still has very little to say and is not engaging much.  However she thinks he is doing better without the carbamazepine and we will also stop the Cogentin.  He will continue Prozac 40 mg daily as well as Abilify 5 mg daily for depression, Aricept 5 mg daily for memory loss and trazodone 50 mg daily at bedtime for sleep.  He will return to see me in 4 months   Diannia Ruder, MD 06/03/2017, 2:56 PM

## 2017-06-09 DIAGNOSIS — Z7984 Long term (current) use of oral hypoglycemic drugs: Secondary | ICD-10-CM | POA: Diagnosis not present

## 2017-06-09 DIAGNOSIS — E7800 Pure hypercholesterolemia, unspecified: Secondary | ICD-10-CM | POA: Diagnosis not present

## 2017-06-09 DIAGNOSIS — F329 Major depressive disorder, single episode, unspecified: Secondary | ICD-10-CM | POA: Diagnosis not present

## 2017-06-09 DIAGNOSIS — Z23 Encounter for immunization: Secondary | ICD-10-CM | POA: Diagnosis not present

## 2017-06-09 DIAGNOSIS — W228XXA Striking against or struck by other objects, initial encounter: Secondary | ICD-10-CM | POA: Diagnosis not present

## 2017-06-09 DIAGNOSIS — E1140 Type 2 diabetes mellitus with diabetic neuropathy, unspecified: Secondary | ICD-10-CM | POA: Diagnosis not present

## 2017-06-09 DIAGNOSIS — Z7902 Long term (current) use of antithrombotics/antiplatelets: Secondary | ICD-10-CM | POA: Diagnosis not present

## 2017-06-09 DIAGNOSIS — G309 Alzheimer's disease, unspecified: Secondary | ICD-10-CM | POA: Diagnosis not present

## 2017-06-09 DIAGNOSIS — S0181XA Laceration without foreign body of other part of head, initial encounter: Secondary | ICD-10-CM | POA: Diagnosis not present

## 2017-06-09 DIAGNOSIS — Z79899 Other long term (current) drug therapy: Secondary | ICD-10-CM | POA: Diagnosis not present

## 2017-06-09 DIAGNOSIS — R220 Localized swelling, mass and lump, head: Secondary | ICD-10-CM | POA: Diagnosis not present

## 2017-06-09 DIAGNOSIS — Z8673 Personal history of transient ischemic attack (TIA), and cerebral infarction without residual deficits: Secondary | ICD-10-CM | POA: Diagnosis not present

## 2017-06-09 DIAGNOSIS — S01511A Laceration without foreign body of lip, initial encounter: Secondary | ICD-10-CM | POA: Diagnosis not present

## 2017-06-09 DIAGNOSIS — F0280 Dementia in other diseases classified elsewhere without behavioral disturbance: Secondary | ICD-10-CM | POA: Diagnosis not present

## 2017-06-09 DIAGNOSIS — I10 Essential (primary) hypertension: Secondary | ICD-10-CM | POA: Diagnosis not present

## 2017-06-09 DIAGNOSIS — S0990XA Unspecified injury of head, initial encounter: Secondary | ICD-10-CM | POA: Diagnosis not present

## 2017-09-08 DIAGNOSIS — Z6822 Body mass index (BMI) 22.0-22.9, adult: Secondary | ICD-10-CM | POA: Diagnosis not present

## 2017-09-08 DIAGNOSIS — E1151 Type 2 diabetes mellitus with diabetic peripheral angiopathy without gangrene: Secondary | ICD-10-CM | POA: Diagnosis not present

## 2017-09-08 DIAGNOSIS — Z299 Encounter for prophylactic measures, unspecified: Secondary | ICD-10-CM | POA: Diagnosis not present

## 2017-09-08 DIAGNOSIS — E1165 Type 2 diabetes mellitus with hyperglycemia: Secondary | ICD-10-CM | POA: Diagnosis not present

## 2017-09-08 DIAGNOSIS — I739 Peripheral vascular disease, unspecified: Secondary | ICD-10-CM | POA: Diagnosis not present

## 2017-09-08 DIAGNOSIS — I10 Essential (primary) hypertension: Secondary | ICD-10-CM | POA: Diagnosis not present

## 2017-10-09 ENCOUNTER — Ambulatory Visit (HOSPITAL_COMMUNITY): Payer: Self-pay | Admitting: Psychiatry

## 2017-10-13 ENCOUNTER — Ambulatory Visit (INDEPENDENT_AMBULATORY_CARE_PROVIDER_SITE_OTHER): Payer: PPO | Admitting: Psychiatry

## 2017-10-13 ENCOUNTER — Encounter (HOSPITAL_COMMUNITY): Payer: Self-pay | Admitting: Psychiatry

## 2017-10-13 VITALS — BP 126/70 | HR 69 | Ht 68.5 in | Wt 144.0 lb

## 2017-10-13 DIAGNOSIS — F09 Unspecified mental disorder due to known physiological condition: Secondary | ICD-10-CM

## 2017-10-13 DIAGNOSIS — Z811 Family history of alcohol abuse and dependence: Secondary | ICD-10-CM

## 2017-10-13 DIAGNOSIS — Z89612 Acquired absence of left leg above knee: Secondary | ICD-10-CM

## 2017-10-13 DIAGNOSIS — F322 Major depressive disorder, single episode, severe without psychotic features: Secondary | ICD-10-CM | POA: Diagnosis not present

## 2017-10-13 MED ORDER — ARIPIPRAZOLE 5 MG PO TABS: 5.0000 mg | ORAL_TABLET | Freq: Every day | ORAL | 2 refills | Status: DC

## 2017-10-13 MED ORDER — FLUOXETINE HCL 20 MG PO CAPS: 40.0000 mg | ORAL_CAPSULE | Freq: Every day | ORAL | 2 refills | Status: DC

## 2017-10-13 MED ORDER — TRAZODONE HCL 50 MG PO TABS: 50.0000 mg | ORAL_TABLET | Freq: Every day | ORAL | 2 refills | Status: DC

## 2017-10-13 MED ORDER — DONEPEZIL HCL 5 MG PO TABS: 5.0000 mg | ORAL_TABLET | Freq: Every day | ORAL | 2 refills | Status: DC

## 2017-10-13 NOTE — Progress Notes (Signed)
BH MD/PA/NP OP Progress Note  10/13/2017 2:59 PM Chad HamperBobby Gnau  MRN:  [id number]  Chief Complaint:  Chief Complaint    Depression; Follow-up     HPI: This patient is a 67year-old married black male who lives with his wife in [place]. He has 2 children who are grown. 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  Apparently when the patient was in better health and used to work he was a happy go lucky person according to his wife. Since she's had the amputation he has gone downhill. In fact he was hospitalized in June for severe depression. He's been on numerous medications in the past. He used to be angry and aggressive but now seems blunted and shut down. He has no interest in life. He sleeps all day or watches TV. He refuses to bathe which is a big problem for his wife. He is very defensive and gets angry when I questioned his wife about his status, almost to the point of paranoia. He claims he can't sleep but his wife reports that he sleeps through the day and not at night. He denies any pain  The patient and wife return after 4 months.  For the most part he is doing okay.  Now that she is retired she can keep him on a fairly rigid schedule.  He still does not do well with his bowel movements and ends up using depends and going in his pants.  She is trying to get him on a schedule is one with a toddler.  He seems less angry but still slightly irritable.  His memory has not declined any further at least by his wife's estimate.  He denies being depressed or suicidal.  They are getting out and visiting family members and he seems a little less shut down. Visit Diagnosis:    ICD-10-CM   1. Severe single current episode of major depressive disorder, without psychotic features (HCC) F32.2   2. Cognitive disorder F09     Past Psychiatric History: Patient has been seen in this office since 2009.  He has been on numerous mood stabilizers  and antidepressants.  Past Medical History:  Past Medical History:  Diagnosis Date  . Depression   . Diabetes mellitus type II   . Hypertension   . S/P BKA (below knee amputation) unilateral (HCC)   . Stroke [facility](HCC)     Past Surgical History:  Procedure Laterality Date  . ABDOMINAL AORTAGRAM N/A 04/29/2012   Procedure: ABDOMINAL Ronny FlurryAORTAGRAM;  Surgeon: Nada LibmanVance W Brabham, MD;  [facility];  Service: Cardiovascular;  Laterality: N/A;  . CATARACT EXTRACTION W/PHACO Left 12/04/2015   Procedure: CATARACT EXTRACTION PHACO AND INTRAOCULAR LENS PLACEMENT LEFT EYE CDE=19.92;  Surgeon: Gemma PayorKerry Hunt, MD;  Location: [facility];  Service: Ophthalmology;  Laterality: Left;  left  . cataract removed    . left leg amputa    . left leg amputated    . SHOULDER SURGERY      Family Psychiatric History: See below  Family History:  Family History  Problem Relation Age of Onset  . Heart disease Mother   . Diabetes Father   . Alcohol abuse Brother   . Diabetes Brother   . Diabetes Sister   . Peripheral vascular disease Sister        amputation  . Diabetes Daughter   . Anxiety disorder Neg Hx   .  Bipolar disorder Neg Hx   . Dementia Neg Hx   . Depression Neg Hx   . Drug abuse Neg Hx   . Paranoid behavior Neg Hx   . Schizophrenia Neg Hx   . OCD Neg Hx   . Seizures Neg Hx   . Sexual abuse Neg Hx   . Physical abuse Neg Hx   . ADD / ADHD Neg Hx     Social History:  Social History   Socioeconomic History  . Marital status: Married    Spouse name: Not on file  . Number of children: Not on file  . Years of education: Not on file  . Highest education level: Not on file  Occupational History  . Not on file  Social Needs  . Financial resource strain: Not on file  . Food insecurity:    Worry: Not on file    Inability: Not on file  . Transportation needs:    Medical: Not on file    Non-medical: Not on file  Tobacco Use  . Smoking status: Never Smoker  . Smokeless tobacco: Never Used   Substance and Sexual Activity  . Alcohol use: No  . Drug use: No  . Sexual activity: Not Currently  Lifestyle  . Physical activity:    Days per week: Not on file    Minutes per session: Not on file  . Stress: Not on file  Relationships  . Social connections:    Talks on phone: Not on file    Gets together: Not on file    Attends religious service: Not on file    Active member of club or organization: Not on file    Attends meetings of clubs or organizations: Not on file    Relationship status: Not on file  Other Topics Concern  . Not on file  Social History Narrative  . Not on file    Allergies: No Known Allergies  Metabolic Disorder Labs: Lab Results  Component Value Date   HGBA1C (H) 08/14/2007    6.3 (NOTE)   The ADA recommends the following therapeutic goal for glycemic   control related to Hgb A1C measurement:   Goal of Therapy:   < 7.0% Hgb A1C   Reference: American Diabetes Association: Clinical Practice   Recommendations 2008, Diabetes Care,  2008, 31:(Suppl 1).   MPG 147 08/14/2007   No results found for: PROLACTIN No results found for: CHOL, TRIG, HDL, CHOLHDL, VLDL, LDLCALC No results found for: TSH  Therapeutic Level Labs: No results found for: LITHIUM No results found for: VALPROATE No components found for:  CBMZ  Current Medications: Current Outpatient Medications  Medication Sig Dispense Refill  . ARIPiprazole (ABILIFY) 5 MG tablet Take 1 tablet (5 mg total) by mouth at bedtime. 90 tablet 2  . clopidogrel (PLAVIX) 75 MG tablet Take 1 tablet (75 mg total) by mouth daily.    Marland Kitchen donepezil (ARICEPT) 5 MG tablet Take 1 tablet (5 mg total) by mouth at bedtime. 90 tablet 2  . FLUoxetine (PROZAC) 20 MG capsule Take 2 capsules (40 mg total) by mouth daily. 180 capsule 2  . metFORMIN (GLUCOPHAGE) 500 MG tablet Take 1 tablet (500 mg total) by mouth 2 (two) times daily with a meal.    . metoprolol tartrate (LOPRESSOR) 25 MG tablet Take 1 tablet (25 mg total) by  mouth 2 (two) times daily. (Patient taking differently: Take 25 mg by mouth daily at 3 pm. )    . simvastatin (ZOCOR) 40 MG  tablet Take 40 mg by mouth every evening.    . sitaGLIPtin (JANUVIA) 100 MG tablet Take 1 tablet (100 mg total) by mouth daily.    . traZODone (DESYREL) 50 MG tablet Take 1 tablet (50 mg total) by mouth at bedtime. 90 tablet 2   No current facility-administered medications for this visit.      Musculoskeletal: Strength & Muscle Tone: decreased Gait & Station: unsteady Patient leans: N/A  Psychiatric Specialty Exam: Review of Systems  Constitutional: Positive for weight loss.  Gastrointestinal: Positive for diarrhea.  Neurological: Positive for weakness.  All other systems reviewed and are negative.   Blood pressure 126/70, pulse 69, height 5' 8.5" (1.74 m), weight 144 lb (65.3 kg), SpO2 95 %.Body mass index is 21.58 kg/m.  General Appearance: Casual and Fairly Groomed  Eye Contact:  Fair  Speech:  Slow  Volume:  Decreased  Mood:  Dysphoric and Irritable  Affect:  Constricted  Thought Process:  Goal Directed  Orientation:  Full (Time, Place, and Person)  Thought Content: Rumination   Suicidal Thoughts:  No  Homicidal Thoughts:  No  Memory:  Immediate;   Good Recent;   Poor Remote;   Poor  Judgement:  Poor  Insight:  Lacking  Psychomotor Activity:  Decreased  Concentration:  Concentration: Poor and Attention Span: Poor  Recall:  [employer] of Knowledge: Fair  Language: Fair  Akathisia:  No  Handed:  Right  AIMS (if indicated): not done  Assets:  Communication Skills Resilience Social Support  ADL's:  Impaired  Cognition: Impaired,  Mild  Sleep:  Fair   Screenings: AUDIT     [facility]  Alcohol Use Disorder Identification Test Final Score (AUDIT)  0       Assessment and Plan: This patient is a 67 year old male with a history of dementia and depression.  His  wife's efforts have kept him fairly stable.  He will continue trazodone 50 mg at bedtime for sleep, Prozac 40 mg daily for depression, Aricept 5 mg to slow the decline of memory loss and Abilify 5 mg daily for augmentation.  He will return to see me in 4 months   Diannia Ruder, MD 10/13/2017, 2:59 PM

## 2017-12-29 DIAGNOSIS — I10 Essential (primary) hypertension: Secondary | ICD-10-CM | POA: Diagnosis not present

## 2017-12-29 DIAGNOSIS — Z299 Encounter for prophylactic measures, unspecified: Secondary | ICD-10-CM | POA: Diagnosis not present

## 2017-12-29 DIAGNOSIS — I739 Peripheral vascular disease, unspecified: Secondary | ICD-10-CM | POA: Diagnosis not present

## 2017-12-29 DIAGNOSIS — Z6822 Body mass index (BMI) 22.0-22.9, adult: Secondary | ICD-10-CM | POA: Diagnosis not present

## 2017-12-29 DIAGNOSIS — E1165 Type 2 diabetes mellitus with hyperglycemia: Secondary | ICD-10-CM | POA: Diagnosis not present

## 2017-12-29 DIAGNOSIS — E1151 Type 2 diabetes mellitus with diabetic peripheral angiopathy without gangrene: Secondary | ICD-10-CM | POA: Diagnosis not present

## 2017-12-29 DIAGNOSIS — Z2821 Immunization not carried out because of patient refusal: Secondary | ICD-10-CM | POA: Diagnosis not present

## 2018-02-12 ENCOUNTER — Encounter (HOSPITAL_COMMUNITY): Payer: Self-pay | Admitting: Psychiatry

## 2018-02-12 ENCOUNTER — Ambulatory Visit (INDEPENDENT_AMBULATORY_CARE_PROVIDER_SITE_OTHER): Payer: PPO | Admitting: Psychiatry

## 2018-02-12 VITALS — BP 106/60 | HR 64 | Ht 68.5 in | Wt 147.0 lb

## 2018-02-12 DIAGNOSIS — F322 Major depressive disorder, single episode, severe without psychotic features: Secondary | ICD-10-CM

## 2018-02-12 DIAGNOSIS — F09 Unspecified mental disorder due to known physiological condition: Secondary | ICD-10-CM

## 2018-02-12 MED ORDER — ARIPIPRAZOLE 5 MG PO TABS
5.0000 mg | ORAL_TABLET | Freq: Every day | ORAL | 2 refills | Status: DC
Start: 2018-02-12 — End: 2018-06-16

## 2018-02-12 MED ORDER — TRAZODONE HCL 50 MG PO TABS: 50.0000 mg | ORAL_TABLET | Freq: Every day | ORAL | 2 refills | Status: DC

## 2018-02-12 MED ORDER — DONEPEZIL HCL 5 MG PO TABS: 5.0000 mg | ORAL_TABLET | Freq: Every day | ORAL | 2 refills | Status: DC

## 2018-02-12 MED ORDER — FLUOXETINE HCL 20 MG PO CAPS: 40.0000 mg | ORAL_CAPSULE | Freq: Every day | ORAL | 2 refills | Status: DC

## 2018-02-12 MED ORDER — BENZTROPINE MESYLATE 0.5 MG PO TABS: 0.5000 mg | ORAL_TABLET | Freq: Every day | ORAL | 2 refills | Status: DC

## 2018-02-12 NOTE — Progress Notes (Signed)
BH MD/PA/NP OP Progress Note  02/12/2018 2:54 PM Alexi Geibel  MRN:  [id number]  Chief Complaint:  Chief Complaint    Depression; Anxiety; Memory Loss; Follow-up     HPI: This patient is a 68year-old married black male who lives with his wife in [place]. He has 2 children who are grown. 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  Apparently when the patient was in better health and used to work he was a happy go lucky person according to his wife. Since she's had the amputation he has gone downhill. In fact he was hospitalized in June for severe depression. He's been on numerous medications in the past. He used to be angry and aggressive but now seems blunted and shut down. He has no interest in life. He sleeps all day or watches TV. He refuses to bathe which is a big problem for his wife. He is very defensive and gets angry when I questioned his wife about his status, almost to the point of paranoia. He claims he can't sleep but his wife reports that he sleeps through the day and not at night. He denies any pain  The patient returns with his wife after 4 months.  He is doing about the same.  He is eating a little bit better and has gained 3 pounds.  He keeps moving his legs back and forth and kicking his walker today.  He seems to be more restless.  His wife states that the insurance would not approve the benztropine and it seemed to help with this.  We will send it back in and try to get it approved.  His mood is about the same.  He needs a lot of redirection.  He forgets he is just eaten and constantly demands to eat more.  Other than that his wife states he remembers things fairly well.  He sleeps well at night with the trazodone.  He does need constant supervision. Visit Diagnosis:    ICD-10-CM   1. Severe single current episode of major depressive disorder, without psychotic features (HCC) F32.2   2. Cognitive disorder F09      Past Psychiatric History: The patient has been seen in this office since 2009.  He has been on numerous mood stabilizers and antidepressants  Past Medical History:  Past Medical History:  Diagnosis Date  . Depression   . Diabetes mellitus type II   . Hypertension   . S/P BKA (below knee amputation) unilateral (HCC)   . Stroke [facility])     Past Surgical History:  Procedure Laterality Date  . ABDOMINAL AORTAGRAM N/A 04/29/2012   Procedure: ABDOMINAL Ronny Flurry;  Surgeon: Nada Libman, MD;  Location: [facility] CATH LAB;  Service: Cardiovascular;  Laterality: N/A;  . CATARACT EXTRACTION W/PHACO Left 12/04/2015   Procedure: CATARACT EXTRACTION PHACO AND INTRAOCULAR LENS PLACEMENT LEFT EYE CDE=19.92;  Surgeon: Gemma Payor, MD;  Location: [facility] ORS;  Service: Ophthalmology;  Laterality: Left;  left  . cataract removed    . left leg amputa    . left leg amputated    . SHOULDER SURGERY      Family Psychiatric History: see below  Family History:  Family History  Problem Relation Age of Onset  . Heart disease Mother   . Diabetes Father   . Alcohol abuse Brother   . Diabetes Brother   . Diabetes Sister   . Peripheral  vascular disease Sister        amputation  . Diabetes Daughter   . Anxiety disorder Neg Hx   . Bipolar disorder Neg Hx   . Dementia Neg Hx   . Depression Neg Hx   . Drug abuse Neg Hx   . Paranoid behavior Neg Hx   . Schizophrenia Neg Hx   . OCD Neg Hx   . Seizures Neg Hx   . Sexual abuse Neg Hx   . Physical abuse Neg Hx   . ADD / ADHD Neg Hx     Social History:  Social History   Socioeconomic History  . Marital status: Married    Spouse name: Not on file  . Number of children: Not on file  . Years of education: Not on file  . Highest education level: Not on file  Occupational History  . Not on file  Social Needs  . Financial resource strain: Not on file  . Food insecurity:    Worry: Not on file    Inability: Not on file  . Transportation needs:    Medical:  Not on file    Non-medical: Not on file  Tobacco Use  . Smoking status: Never Smoker  . Smokeless tobacco: Never Used  Substance and Sexual Activity  . Alcohol use: No  . Drug use: No  . Sexual activity: Not Currently  Lifestyle  . Physical activity:    Days per week: Not on file    Minutes per session: Not on file  . Stress: Not on file  Relationships  . Social connections:    Talks on phone: Not on file    Gets together: Not on file    Attends religious service: Not on file    Active member of club or organization: Not on file    Attends meetings of clubs or organizations: Not on file    Relationship status: Not on file  Other Topics Concern  . Not on file  Social History Narrative  . Not on file    Allergies: No Known Allergies  Metabolic Disorder Labs: Lab Results  Component Value Date   HGBA1C (H) 08/14/2007    6.3 (NOTE)   The ADA recommends the following therapeutic goal for glycemic   control related to Hgb A1C measurement:   Goal of Therapy:   < 7.0% Hgb A1C   Reference: American Diabetes Association: Clinical Practice   Recommendations 2008, Diabetes Care,  2008, 31:(Suppl 1).   MPG 147 08/14/2007   No results found for: PROLACTIN No results found for: CHOL, TRIG, HDL, CHOLHDL, VLDL, LDLCALC No results found for: TSH  Therapeutic Level Labs: No results found for: LITHIUM No results found for: VALPROATE No components found for:  CBMZ  Current Medications: Current Outpatient Medications  Medication Sig Dispense Refill  . ARIPiprazole (ABILIFY) 5 MG tablet Take 1 tablet (5 mg total) by mouth at bedtime. 90 tablet 2  . clopidogrel (PLAVIX) 75 MG tablet Take 1 tablet (75 mg total) by mouth daily.    Marland Kitchen. donepezil (ARICEPT) 5 MG tablet Take 1 tablet (5 mg total) by mouth at bedtime. 90 tablet 2  . FLUoxetine (PROZAC) 20 MG capsule Take 2 capsules (40 mg total) by mouth daily. 180 capsule 2  . metFORMIN (GLUCOPHAGE) 500 MG tablet Take 1 tablet (500 mg total) by  mouth 2 (two) times daily with a meal.    . metoprolol tartrate (LOPRESSOR) 25 MG tablet Take 1 tablet (25 mg total) by mouth  2 (two) times daily. (Patient taking differently: Take 25 mg by mouth daily at 3 pm. )    . simvastatin (ZOCOR) 40 MG tablet Take 40 mg by mouth every evening.    . sitaGLIPtin (JANUVIA) 100 MG tablet Take 1 tablet (100 mg total) by mouth daily.    . traZODone (DESYREL) 50 MG tablet Take 1 tablet (50 mg total) by mouth at bedtime. 90 tablet 2  . benztropine (COGENTIN) 0.5 MG tablet Take 1 tablet (0.5 mg total) by mouth at bedtime. 90 tablet 2   No current facility-administered medications for this visit.      Musculoskeletal: Strength & Muscle Tone: decreased Gait & Station: unsteady Patient leans: Front  Psychiatric Specialty Exam: Review of Systems  Psychiatric/Behavioral: Positive for memory loss. The patient is nervous/anxious.   All other systems reviewed and are negative.   Blood pressure 106/60, pulse 64, height 5' 8.5" (1.74 m), weight 147 lb (66.7 kg), SpO2 98 %.Body mass index is 22.03 kg/m.  General Appearance: Casual and Fairly Groomed  Eye Contact:  Poor  Speech:  Slow  Volume:  Decreased  Mood:  Irritable  Affect:  Blunt and Flat  Thought Process:  Irrelevant  Orientation:  Full (Time, Place, and Person)  Thought Content: He says very little so difficult to assess   Suicidal Thoughts:  No  Homicidal Thoughts:  No  Memory:  Immediate;   Fair Recent;   Poor Remote;   Poor  Judgement:  Impaired  Insight:  Lacking  Psychomotor Activity:  Restlessness  Concentration:  Concentration: Fair and Attention Span: Fair  Recall:  Poor  Fund of Knowledge: Fair  Language: Fair  Akathisia:  No  Handed:  Right  AIMS (if indicated): not done  Assets:  Communication Skills Resilience Social Support  ADL's:  Impaired  Cognition: Impaired,  Mild  Sleep:  Fair   Screenings: AUDIT     [facility]  Alcohol Use Disorder Identification Test Final Score (AUDIT)  0       Assessment and Plan: This patient is a 68 year old male with a history of leg amputation dementia and depression.  He seems to be doing well although he seems more restless without the Cogentin.  Will reinstate Cogentin 0.5 mg at bedtime.  He will continue Abilify 5 mg for augmentation, Prozac 40 mg daily for depression Aricept 5 mg daily for memory loss and trazodone 50 mg at bedtime for sleep.  He will return to see me in 4 months   Diannia Rudereborah Jkai Arwood, MD 02/12/2018, 2:54 PM

## 2018-02-20 DIAGNOSIS — Z79899 Other long term (current) drug therapy: Secondary | ICD-10-CM | POA: Diagnosis not present

## 2018-02-20 DIAGNOSIS — E7800 Pure hypercholesterolemia, unspecified: Secondary | ICD-10-CM | POA: Diagnosis not present

## 2018-02-20 DIAGNOSIS — Z125 Encounter for screening for malignant neoplasm of prostate: Secondary | ICD-10-CM | POA: Diagnosis not present

## 2018-02-20 DIAGNOSIS — E1165 Type 2 diabetes mellitus with hyperglycemia: Secondary | ICD-10-CM | POA: Diagnosis not present

## 2018-02-20 DIAGNOSIS — I10 Essential (primary) hypertension: Secondary | ICD-10-CM | POA: Diagnosis not present

## 2018-02-20 DIAGNOSIS — Z1331 Encounter for screening for depression: Secondary | ICD-10-CM | POA: Diagnosis not present

## 2018-02-20 DIAGNOSIS — R5383 Other fatigue: Secondary | ICD-10-CM | POA: Diagnosis not present

## 2018-02-20 DIAGNOSIS — Z0000 Encounter for general adult medical examination without abnormal findings: Secondary | ICD-10-CM | POA: Diagnosis not present

## 2018-02-20 DIAGNOSIS — Z6822 Body mass index (BMI) 22.0-22.9, adult: Secondary | ICD-10-CM | POA: Diagnosis not present

## 2018-02-20 DIAGNOSIS — Z1211 Encounter for screening for malignant neoplasm of colon: Secondary | ICD-10-CM | POA: Diagnosis not present

## 2018-02-20 DIAGNOSIS — Z7189 Other specified counseling: Secondary | ICD-10-CM | POA: Diagnosis not present

## 2018-02-20 DIAGNOSIS — Z299 Encounter for prophylactic measures, unspecified: Secondary | ICD-10-CM | POA: Diagnosis not present

## 2018-02-20 DIAGNOSIS — Z1339 Encounter for screening examination for other mental health and behavioral disorders: Secondary | ICD-10-CM | POA: Diagnosis not present

## 2018-03-04 DIAGNOSIS — E1151 Type 2 diabetes mellitus with diabetic peripheral angiopathy without gangrene: Secondary | ICD-10-CM | POA: Diagnosis not present

## 2018-03-04 DIAGNOSIS — M79672 Pain in left foot: Secondary | ICD-10-CM | POA: Diagnosis not present

## 2018-03-04 DIAGNOSIS — E1140 Type 2 diabetes mellitus with diabetic neuropathy, unspecified: Secondary | ICD-10-CM | POA: Diagnosis not present

## 2018-03-04 DIAGNOSIS — L110 Acquired keratosis follicularis: Secondary | ICD-10-CM | POA: Diagnosis not present

## 2018-03-04 DIAGNOSIS — M79671 Pain in right foot: Secondary | ICD-10-CM | POA: Diagnosis not present

## 2018-04-15 DIAGNOSIS — I10 Essential (primary) hypertension: Secondary | ICD-10-CM | POA: Diagnosis not present

## 2018-04-15 DIAGNOSIS — E1165 Type 2 diabetes mellitus with hyperglycemia: Secondary | ICD-10-CM | POA: Diagnosis not present

## 2018-04-15 DIAGNOSIS — E1151 Type 2 diabetes mellitus with diabetic peripheral angiopathy without gangrene: Secondary | ICD-10-CM | POA: Diagnosis not present

## 2018-04-15 DIAGNOSIS — Z299 Encounter for prophylactic measures, unspecified: Secondary | ICD-10-CM | POA: Diagnosis not present

## 2018-04-15 DIAGNOSIS — Z89512 Acquired absence of left leg below knee: Secondary | ICD-10-CM | POA: Diagnosis not present

## 2018-04-15 DIAGNOSIS — Z6822 Body mass index (BMI) 22.0-22.9, adult: Secondary | ICD-10-CM | POA: Diagnosis not present

## 2018-04-23 DIAGNOSIS — Z8673 Personal history of transient ischemic attack (TIA), and cerebral infarction without residual deficits: Secondary | ICD-10-CM | POA: Diagnosis not present

## 2018-04-23 DIAGNOSIS — R404 Transient alteration of awareness: Secondary | ICD-10-CM | POA: Diagnosis not present

## 2018-04-23 DIAGNOSIS — J101 Influenza due to other identified influenza virus with other respiratory manifestations: Secondary | ICD-10-CM | POA: Diagnosis not present

## 2018-04-23 DIAGNOSIS — R4182 Altered mental status, unspecified: Secondary | ICD-10-CM | POA: Diagnosis not present

## 2018-04-23 DIAGNOSIS — E7800 Pure hypercholesterolemia, unspecified: Secondary | ICD-10-CM | POA: Diagnosis not present

## 2018-04-23 DIAGNOSIS — Z7984 Long term (current) use of oral hypoglycemic drugs: Secondary | ICD-10-CM | POA: Diagnosis not present

## 2018-04-23 DIAGNOSIS — Z89512 Acquired absence of left leg below knee: Secondary | ICD-10-CM | POA: Diagnosis not present

## 2018-04-23 DIAGNOSIS — G8929 Other chronic pain: Secondary | ICD-10-CM | POA: Diagnosis not present

## 2018-04-23 DIAGNOSIS — E1151 Type 2 diabetes mellitus with diabetic peripheral angiopathy without gangrene: Secondary | ICD-10-CM | POA: Diagnosis not present

## 2018-04-23 DIAGNOSIS — R509 Fever, unspecified: Secondary | ICD-10-CM | POA: Diagnosis not present

## 2018-04-23 DIAGNOSIS — Z20828 Contact with and (suspected) exposure to other viral communicable diseases: Secondary | ICD-10-CM | POA: Diagnosis not present

## 2018-04-23 DIAGNOSIS — F329 Major depressive disorder, single episode, unspecified: Secondary | ICD-10-CM | POA: Diagnosis not present

## 2018-04-23 DIAGNOSIS — Z7902 Long term (current) use of antithrombotics/antiplatelets: Secondary | ICD-10-CM | POA: Diagnosis not present

## 2018-04-23 DIAGNOSIS — M545 Low back pain: Secondary | ICD-10-CM | POA: Diagnosis not present

## 2018-04-23 DIAGNOSIS — I959 Hypotension, unspecified: Secondary | ICD-10-CM | POA: Diagnosis not present

## 2018-04-23 DIAGNOSIS — I10 Essential (primary) hypertension: Secondary | ICD-10-CM | POA: Diagnosis not present

## 2018-04-23 DIAGNOSIS — J9811 Atelectasis: Secondary | ICD-10-CM | POA: Diagnosis not present

## 2018-04-23 DIAGNOSIS — R928 Other abnormal and inconclusive findings on diagnostic imaging of breast: Secondary | ICD-10-CM | POA: Diagnosis not present

## 2018-04-23 DIAGNOSIS — R7881 Bacteremia: Secondary | ICD-10-CM | POA: Diagnosis not present

## 2018-04-23 DIAGNOSIS — E1165 Type 2 diabetes mellitus with hyperglycemia: Secondary | ICD-10-CM | POA: Diagnosis not present

## 2018-04-23 DIAGNOSIS — R5381 Other malaise: Secondary | ICD-10-CM | POA: Diagnosis not present

## 2018-05-04 DIAGNOSIS — Z6822 Body mass index (BMI) 22.0-22.9, adult: Secondary | ICD-10-CM | POA: Diagnosis not present

## 2018-05-04 DIAGNOSIS — I739 Peripheral vascular disease, unspecified: Secondary | ICD-10-CM | POA: Diagnosis not present

## 2018-05-04 DIAGNOSIS — Z89512 Acquired absence of left leg below knee: Secondary | ICD-10-CM | POA: Diagnosis not present

## 2018-05-04 DIAGNOSIS — L089 Local infection of the skin and subcutaneous tissue, unspecified: Secondary | ICD-10-CM | POA: Diagnosis not present

## 2018-05-04 DIAGNOSIS — Z299 Encounter for prophylactic measures, unspecified: Secondary | ICD-10-CM | POA: Diagnosis not present

## 2018-05-06 DIAGNOSIS — M79672 Pain in left foot: Secondary | ICD-10-CM | POA: Diagnosis not present

## 2018-05-06 DIAGNOSIS — M79671 Pain in right foot: Secondary | ICD-10-CM | POA: Diagnosis not present

## 2018-05-06 DIAGNOSIS — E1151 Type 2 diabetes mellitus with diabetic peripheral angiopathy without gangrene: Secondary | ICD-10-CM | POA: Diagnosis not present

## 2018-05-06 DIAGNOSIS — R82998 Other abnormal findings in urine: Secondary | ICD-10-CM | POA: Diagnosis not present

## 2018-05-06 DIAGNOSIS — E1140 Type 2 diabetes mellitus with diabetic neuropathy, unspecified: Secondary | ICD-10-CM | POA: Diagnosis not present

## 2018-05-06 DIAGNOSIS — L89893 Pressure ulcer of other site, stage 3: Secondary | ICD-10-CM | POA: Diagnosis not present

## 2018-05-08 ENCOUNTER — Other Ambulatory Visit (HOSPITAL_COMMUNITY): Payer: Self-pay | Admitting: Podiatry

## 2018-05-08 DIAGNOSIS — R0989 Other specified symptoms and signs involving the circulatory and respiratory systems: Secondary | ICD-10-CM

## 2018-05-08 DIAGNOSIS — E1142 Type 2 diabetes mellitus with diabetic polyneuropathy: Secondary | ICD-10-CM | POA: Diagnosis not present

## 2018-05-08 DIAGNOSIS — M869 Osteomyelitis, unspecified: Secondary | ICD-10-CM | POA: Diagnosis not present

## 2018-05-08 DIAGNOSIS — L97519 Non-pressure chronic ulcer of other part of right foot with unspecified severity: Secondary | ICD-10-CM | POA: Diagnosis not present

## 2018-05-11 ENCOUNTER — Other Ambulatory Visit: Payer: Self-pay

## 2018-05-11 ENCOUNTER — Ambulatory Visit (HOSPITAL_COMMUNITY)
Admission: RE | Admit: 2018-05-11 | Discharge: 2018-05-11 | Disposition: A | Discharge status: Home / Self Care | Payer: PPO | Source: Ambulatory Visit | Attending: Podiatry | Admitting: Podiatry

## 2018-05-11 DIAGNOSIS — R0989 Other specified symptoms and signs involving the circulatory and respiratory systems: Secondary | ICD-10-CM

## 2018-05-11 DIAGNOSIS — I998 Other disorder of circulatory system: Secondary | ICD-10-CM | POA: Diagnosis not present

## 2018-05-12 ENCOUNTER — Other Ambulatory Visit: Payer: Self-pay | Admitting: Podiatry

## 2018-05-13 ENCOUNTER — Encounter (HOSPITAL_COMMUNITY)
Admission: RE | Admit: 2018-05-13 | Discharge: 2018-05-13 | Disposition: A | Discharge status: Home / Self Care | Payer: PPO | Source: Ambulatory Visit | Attending: Gastroenterology | Admitting: Gastroenterology

## 2018-05-13 ENCOUNTER — Other Ambulatory Visit: Payer: Self-pay

## 2018-05-13 ENCOUNTER — Encounter (HOSPITAL_COMMUNITY): Payer: Self-pay

## 2018-05-13 ENCOUNTER — Ambulatory Visit (HOSPITAL_COMMUNITY)
Admission: RE | Admit: 2018-05-13 | Discharge: 2018-05-13 | Disposition: A | Discharge status: Home / Self Care | Payer: PPO | Source: Ambulatory Visit | Attending: Podiatry | Admitting: Podiatry

## 2018-05-13 DIAGNOSIS — M869 Osteomyelitis, unspecified: Secondary | ICD-10-CM | POA: Insufficient documentation

## 2018-05-13 DIAGNOSIS — Z89421 Acquired absence of other right toe(s): Secondary | ICD-10-CM | POA: Diagnosis not present

## 2018-05-13 DIAGNOSIS — M79671 Pain in right foot: Secondary | ICD-10-CM | POA: Diagnosis not present

## 2018-05-13 DIAGNOSIS — Z01818 Encounter for other preprocedural examination: Secondary | ICD-10-CM | POA: Diagnosis not present

## 2018-05-13 HISTORY — DX: Pure hypercholesterolemia, unspecified: E78.00

## 2018-05-13 LAB — CBC WITH DIFFERENTIAL/PLATELET
Abs Immature Granulocytes: 0.03 10*3/uL (ref 0.00–0.07)
Basophils Absolute: 0 10*3/uL (ref 0.0–0.1)
Basophils Relative: 1 %
Eosinophils Absolute: 0.1 10*3/uL (ref 0.0–0.5)
Eosinophils Relative: 2 %
HCT: 35.1 % — ABNORMAL LOW (ref 39.0–52.0)
Hemoglobin: 10.8 g/dL — ABNORMAL LOW (ref 13.0–17.0)
Immature Granulocytes: 0 %
Lymphocytes Relative: 25 %
Lymphs Abs: 1.9 10*3/uL (ref 0.7–4.0)
MCH: 27.8 pg (ref 26.0–34.0)
MCHC: 30.8 g/dL (ref 30.0–36.0)
MCV: 90.5 fL (ref 80.0–100.0)
Monocytes Absolute: 0.5 10*3/uL (ref 0.1–1.0)
Monocytes Relative: 6 %
Neutro Abs: 5 10*3/uL (ref 1.7–7.7)
Neutrophils Relative %: 66 %
Platelets: 237 10*3/uL (ref 150–400)
RBC: 3.88 MIL/uL — ABNORMAL LOW (ref 4.22–5.81)
RDW: 12.5 % (ref 11.5–15.5)
WBC: 7.5 10*3/uL (ref 4.0–10.5)
nRBC: 0 % (ref 0.0–0.2)

## 2018-05-13 LAB — BASIC METABOLIC PANEL
Anion gap: 8 (ref 5–15)
BUN: 15 mg/dL (ref 8–23)
CO2: 24 mmol/L (ref 22–32)
Calcium: 9 mg/dL (ref 8.9–10.3)
Chloride: 105 mmol/L (ref 98–111)
Creatinine, Ser: 0.85 mg/dL (ref 0.61–1.24)
GFR calc Af Amer: >60 mL/min (ref >60)
GFR calc non Af Amer: >60 mL/min (ref >60)
Glucose, Bld: 131 mg/dL — ABNORMAL HIGH (ref 70–99)
Potassium: 4.1 mmol/L (ref 3.5–5.1)
Sodium: 137 mmol/L (ref 135–145)

## 2018-05-13 LAB — HEMOGLOBIN A1C
Hgb A1c MFr Bld: 7.2 % — ABNORMAL HIGH (ref 4.8–5.6)
Mean Plasma Glucose: 159.94 mg/dL

## 2018-05-13 LAB — GLUCOSE, CAPILLARY: Glucose-Capillary: 124 mg/dL — ABNORMAL HIGH (ref 70–99)

## 2018-05-13 NOTE — Patient Instructions (Signed)
Chad Grant  05/13/2018     @PREFPERIOPPHARMACY @   Your procedure is scheduled on  05/15/2018.  Report to [facility] at  615  A.M.  Call this number if you have problems the morning of surgery:  [phone]   Remember:  Do not eat or drink after midnight.                        Take these medicines the morning of surgery with A SIP OF WATER  Prozac, metoprolol.    Do not wear jewelry, make-up or nail polish.  Do not wear lotions, powders, or perfumes, or deodorant.  Do not shave 48 hours prior to surgery.  Men may shave face and neck.  Do not bring valuables to the hospital.  [facility] is not responsible for any belongings or valuables.  Contacts, dentures or bridgework may not be worn into surgery.  Leave your suitcase in the car.  After surgery it may be brought to your room.  For patients admitted to the hospital, discharge time will be determined by your treatment team.  Patients discharged the day of surgery will not be allowed to drive home.   Name and phone number of your driver:    Family    Special instructions:  Follow any instructions given to you by Dr Loralie Champagne office concerning plavix.  Please read over the following fact sheets that you were given. Anesthesia Post-op Instructions and Care and Recovery After Surgery      Toe Amputation, Care After Refer to this sheet in the next few weeks. These instructions provide you with information on caring for yourself after your procedure. Your health care provider may also give you more specific instructions. Your treatment has been planned according to current medical practices, but problems sometimes occur. Call your health care provider if you have any problems or questions after your procedure. What can I expect after the procedure? After your procedure, it is common to have some pain. Pain usually improves within a week. Follow these instructions at home: Medicines  Take your antibiotic medicine as  told by your health care provider. Do not stop taking the antibiotic even if you start to feel better.  Take over-the-counter and prescription medicines only as told by your health care provider. Managing pain, stiffness, and swelling   If directed, apply ice to the surgical area: ? Put ice in a plastic bag. ? Place a towel between your skin and the bag. ? Leave the ice on for 20 minutes, 2-3 times a day.  Raise (elevate) your foot so it is above the level of your heart. This helps to reduce swelling.  Try to walk each day. Incision care      Follow instructions from your health care provider about how to take care of your cut from surgery (incision). Make sure you: ? Wash your hands with soap and water before you change your bandage (dressing). If soap and water are not available, use hand sanitizer. ? Change your dressing as told by your health care provider. ? If you got stitches (sutures), leave them in place. They may need to stay in place for 2 weeks or longer.  Check your incision area every day for signs of infection. Check for: ? More redness, swelling, or pain. ? More fluid or blood. ? Warmth. ? Pus or a bad smell. Bathing  Do not take baths, swim, use a hot tub, or soak  your foot until your health care provider approves.  You may shower unless you were told not to. When you shower, keep your dressing dry.  If your dressing has been removed, you may wash your skin with warm water and soap. Driving  Do not drive for 24 hours if you received a sedative.  Do not drive or operate heavy machinery while taking prescription pain medicine. Activity  Do exercises as told by your health care provider.  Return to your normal activities as told by your health care provider. Ask your health care provider what activities are safe for you. General instructions  Do not use any tobacco products, such as cigarettes, chewing tobacco, and e-cigarettes. If you need help quitting,  ask your health care provider.  Ask your health care provider about wearing special shoes or using inserts to support your foot.  If you have diabetes, keep your blood sugar under control.  Keep all follow-up visits as told by your health care provider. This is important. Contact a health care provider if:  You have more redness around your incision.  You have more fluid or blood coming from your incision.  Your incision feels warm to the touch.  You have pus or a bad smell coming from your incision.  You have a fever.  Your dressing is soaked with blood.  Your sutures tear or they separate.  You have numbness or tingling in your toes or foot.  Your foot is cool or pale, or it changes color.  Your pain does not improve after you take your medicine. Get help right away if:  You have pain or swelling that gets worse or does not go away.  You have red streaks on your skin near your toes, foot, or leg.  You have pain in your calf or behind your knee.  You have shortness of breath.  You have chest pain. This information is not intended to replace advice given to you by your health care provider. Make sure you discuss any questions you have with your health care provider. Document Released: 12/19/2014 Document Revised: 09/26/2017 Document Reviewed: 10/01/2014 Elsevier Interactive Patient Education  2019 [employer]

## 2018-05-14 NOTE — Pre-Procedure Instructions (Signed)
HgbA1C routed to PCP and Dr McKinney. 

## 2018-05-15 ENCOUNTER — Encounter (HOSPITAL_COMMUNITY)
Admission: RE | Disposition: A | Discharge status: Home / Self Care | Payer: Self-pay | Source: Home / Self Care | Attending: Podiatry

## 2018-05-15 ENCOUNTER — Ambulatory Visit (HOSPITAL_COMMUNITY): Payer: PPO | Admitting: Anesthesiology

## 2018-05-15 ENCOUNTER — Encounter (HOSPITAL_COMMUNITY): Payer: Self-pay

## 2018-05-15 ENCOUNTER — Ambulatory Visit (HOSPITAL_COMMUNITY): Payer: PPO

## 2018-05-15 ENCOUNTER — Ambulatory Visit (HOSPITAL_COMMUNITY)
Admission: RE | Admit: 2018-05-15 | Discharge: 2018-05-15 | Disposition: A | Discharge status: Home / Self Care | Payer: PPO | Attending: Podiatry | Admitting: Podiatry

## 2018-05-15 ENCOUNTER — Other Ambulatory Visit: Payer: Self-pay

## 2018-05-15 DIAGNOSIS — F329 Major depressive disorder, single episode, unspecified: Secondary | ICD-10-CM | POA: Insufficient documentation

## 2018-05-15 DIAGNOSIS — E785 Hyperlipidemia, unspecified: Secondary | ICD-10-CM | POA: Diagnosis not present

## 2018-05-15 DIAGNOSIS — E11621 Type 2 diabetes mellitus with foot ulcer: Secondary | ICD-10-CM | POA: Diagnosis not present

## 2018-05-15 DIAGNOSIS — Z7984 Long term (current) use of oral hypoglycemic drugs: Secondary | ICD-10-CM | POA: Insufficient documentation

## 2018-05-15 DIAGNOSIS — M86671 Other chronic osteomyelitis, right ankle and foot: Secondary | ICD-10-CM | POA: Diagnosis not present

## 2018-05-15 DIAGNOSIS — E1142 Type 2 diabetes mellitus with diabetic polyneuropathy: Secondary | ICD-10-CM | POA: Diagnosis not present

## 2018-05-15 DIAGNOSIS — Z9889 Other specified postprocedural states: Secondary | ICD-10-CM

## 2018-05-15 DIAGNOSIS — D649 Anemia, unspecified: Secondary | ICD-10-CM | POA: Diagnosis not present

## 2018-05-15 DIAGNOSIS — Z833 Family history of diabetes mellitus: Secondary | ICD-10-CM | POA: Diagnosis not present

## 2018-05-15 DIAGNOSIS — Z89512 Acquired absence of left leg below knee: Secondary | ICD-10-CM | POA: Insufficient documentation

## 2018-05-15 DIAGNOSIS — Z7902 Long term (current) use of antithrombotics/antiplatelets: Secondary | ICD-10-CM | POA: Insufficient documentation

## 2018-05-15 DIAGNOSIS — E1169 Type 2 diabetes mellitus with other specified complication: Secondary | ICD-10-CM | POA: Insufficient documentation

## 2018-05-15 DIAGNOSIS — M869 Osteomyelitis, unspecified: Secondary | ICD-10-CM | POA: Insufficient documentation

## 2018-05-15 DIAGNOSIS — Z89421 Acquired absence of other right toe(s): Secondary | ICD-10-CM | POA: Insufficient documentation

## 2018-05-15 DIAGNOSIS — Z79899 Other long term (current) drug therapy: Secondary | ICD-10-CM | POA: Insufficient documentation

## 2018-05-15 DIAGNOSIS — I10 Essential (primary) hypertension: Secondary | ICD-10-CM | POA: Insufficient documentation

## 2018-05-15 DIAGNOSIS — Z8673 Personal history of transient ischemic attack (TIA), and cerebral infarction without residual deficits: Secondary | ICD-10-CM | POA: Insufficient documentation

## 2018-05-15 HISTORY — PX: AMPUTATION: SHX166

## 2018-05-15 LAB — GLUCOSE, CAPILLARY
Glucose-Capillary: 99 mg/dL (ref 70–99)
Glucose-Capillary: 103 mg/dL — ABNORMAL HIGH (ref 70–99)

## 2018-05-15 SURGERY — AMPUTATION DIGIT
Anesthesia: General | Site: Toe | Laterality: Right

## 2018-05-15 MED ORDER — 0.9 % SODIUM CHLORIDE (POUR BTL) OPTIME
TOPICAL | Status: DC | PRN
  Administered 2018-05-15: 07:00:00 1000 mL

## 2018-05-15 MED ORDER — BUPIVACAINE HCL (PF) 0.25 % IJ SOLN
INTRAMUSCULAR | Status: AC
  Filled 2018-05-15: qty 30

## 2018-05-15 MED ORDER — LACTATED RINGERS IV SOLN
INTRAVENOUS | Status: DC
  Administered 2018-05-15: 07:00:00 via INTRAVENOUS

## 2018-05-15 MED ORDER — CEFAZOLIN SODIUM-DEXTROSE 1-4 GM/50ML-% IV SOLN
INTRAVENOUS | Status: DC | PRN
  Administered 2018-05-15: 1 g via INTRAVENOUS

## 2018-05-15 MED ORDER — CHLORHEXIDINE GLUCONATE CLOTH 2 % EX PADS: 6.0000 | MEDICATED_PAD | Freq: Once | CUTANEOUS | Status: DC

## 2018-05-15 MED ORDER — CEFAZOLIN SODIUM-DEXTROSE 1-4 GM/50ML-% IV SOLN
INTRAVENOUS | Status: AC
  Filled 2018-05-15: qty 50

## 2018-05-15 MED ORDER — DEXTROSE 5 % IV SOLN
2.0000 g | Freq: Once | INTRAVENOUS | Status: DC
  Filled 2018-05-15: qty 20

## 2018-05-15 MED ORDER — BUPIVACAINE HCL (PF) 0.5 % IJ SOLN
INTRAMUSCULAR | Status: DC | PRN
  Administered 2018-05-15: 9 mL

## 2018-05-15 MED ORDER — PROPOFOL 10 MG/ML IV BOLUS
INTRAVENOUS | Status: AC
  Filled 2018-05-15: qty 40

## 2018-05-15 MED ORDER — PROPOFOL 500 MG/50ML IV EMUL
INTRAVENOUS | Status: DC | PRN
  Administered 2018-05-15: 150 ug/kg/min via INTRAVENOUS

## 2018-05-15 MED ORDER — BUPIVACAINE HCL (PF) 0.5 % IJ SOLN
INTRAMUSCULAR | Status: AC
  Filled 2018-05-15: qty 30

## 2018-05-15 SURGICAL SUPPLY — 36 items
BANDAGE ELASTIC 4 LF NS (GAUZE/BANDAGES/DRESSINGS) ×2 IMPLANT
BANDAGE ELASTIC 4 VELCRO NS (GAUZE/BANDAGES/DRESSINGS) ×3 IMPLANT
BANDAGE ESMARK 4X12 BL STRL LF (DISPOSABLE) ×1 IMPLANT
BENZOIN TINCTURE PRP APPL 2/3 (GAUZE/BANDAGES/DRESSINGS) ×3 IMPLANT
BLADE SURG 15 STRL LF DISP TIS (BLADE) ×1 IMPLANT
BLADE SURG 15 STRL SS (BLADE) ×2
BNDG CONFORM 2 STRL LF (GAUZE/BANDAGES/DRESSINGS) ×3 IMPLANT
BNDG ESMARK 4X12 BLUE STRL LF (DISPOSABLE) ×3
BNDG GAUZE ELAST 4 BULKY (GAUZE/BANDAGES/DRESSINGS) ×3 IMPLANT
CLOSURE WOUND 1/2 X4 (GAUZE/BANDAGES/DRESSINGS) ×1
CLOTH BEACON ORANGE TIMEOUT ST (SAFETY) ×3 IMPLANT
COVER LIGHT HANDLE STERIS (MISCELLANEOUS) ×6 IMPLANT
CUFF TOURNIQUET SINGLE 18IN (TOURNIQUET CUFF) ×3 IMPLANT
DECANTER SPIKE VIAL GLASS SM (MISCELLANEOUS) ×3 IMPLANT
DRSG ADAPTIC 3X8 NADH LF (GAUZE/BANDAGES/DRESSINGS) ×3 IMPLANT
ELECT REM PT RETURN 9FT ADLT (ELECTROSURGICAL) ×3
ELECTRODE REM PT RTRN 9FT ADLT (ELECTROSURGICAL) ×1 IMPLANT
GAUZE SPONGE 4X4 12PLY STRL (GAUZE/BANDAGES/DRESSINGS) ×3 IMPLANT
GLOVE BIO SURGEON STRL SZ7.5 (GLOVE) ×3 IMPLANT
GLOVE BIOGEL M 7.0 STRL (GLOVE) ×2 IMPLANT
GLOVE BIOGEL PI IND STRL 7.0 (GLOVE) ×2 IMPLANT
GLOVE BIOGEL PI INDICATOR 7.0 (GLOVE) ×4
GOWN STRL REUS W/TWL LRG LVL3 (GOWN DISPOSABLE) ×6 IMPLANT
KIT TURNOVER KIT A (KITS) ×3 IMPLANT
MANIFOLD NEPTUNE II (INSTRUMENTS) ×3 IMPLANT
NDL HYPO 27GX1-1/4 (NEEDLE) ×2 IMPLANT
NEEDLE HYPO 27GX1-1/4 (NEEDLE) ×6 IMPLANT
NS IRRIG 1000ML POUR BTL (IV SOLUTION) ×3 IMPLANT
PACK BASIC LIMB (CUSTOM PROCEDURE TRAY) ×3 IMPLANT
PAD ARMBOARD 7.5X6 YLW CONV (MISCELLANEOUS) ×3 IMPLANT
SET BASIN LINEN APH (SET/KITS/TRAYS/PACK) ×3 IMPLANT
SOL PREP PROV IODINE SCRUB 4OZ (MISCELLANEOUS) ×3 IMPLANT
STRIP CLOSURE SKIN 1/2X4 (GAUZE/BANDAGES/DRESSINGS) ×2 IMPLANT
SUT ETHILON 4 0 PS 2 18 (SUTURE) ×3 IMPLANT
SUT VIC AB 4-0 PS2 27 (SUTURE) ×3 IMPLANT
SYR CONTROL 10ML LL (SYRINGE) ×6 IMPLANT

## 2018-05-15 NOTE — Anesthesia Preprocedure Evaluation (Addendum)
Anesthesia Evaluation  Patient identified by MRN, date of birth, ID band Patient awake    Reviewed: Allergy & Precautions, H&P , NPO status , Patient's Chart, lab work & pertinent test results, reviewed documented beta blocker date and time   Airway Mallampati: II  TM Distance: >3 FB Neck ROM: full    Dental  (+) Caps   Pulmonary    breath sounds clear to auscultation       Cardiovascular hypertension,  Rhythm:regular     Neuro/Psych Depression CVA, Residual Symptoms    GI/Hepatic negative GI ROS, Neg liver ROS,   Endo/Other  diabetes, Type 2  Renal/GU negative Renal ROS  negative genitourinary   Musculoskeletal   Abdominal   Peds  Hematology  (+) Blood dyscrasia, anemia ,   Anesthesia Other Findings No plavix x 2 days, did not take beta blocker this morning, but HR Is ~60.  Reproductive/Obstetrics                            Anesthesia Physical Anesthesia Plan  ASA: III  Anesthesia Plan: General   Post-op Pain Management:    Induction:   PONV Risk Score and Plan: 1 and TIVA  Airway Management Planned:   Additional Equipment:   Intra-op Plan:   Post-operative Plan:   Informed Consent: I have reviewed the patients History and Physical, chart, labs and discussed the procedure including the risks, benefits and alternatives for the proposed anesthesia with the patient or authorized representative who has indicated his/her understanding and acceptance.     Dental Advisory Given  Plan Discussed with: CRNA  Anesthesia Plan Comments:        Anesthesia Quick Evaluation

## 2018-05-15 NOTE — Discharge Instructions (Addendum)

## 2018-05-15 NOTE — Anesthesia Postprocedure Evaluation (Signed)
Anesthesia Post Note  Patient: Chad Grant  Procedure(s) Performed: AMPUTATION RIGHT THIRD TOE (Right Toe)  Patient location during evaluation: PACU Anesthesia Type: General Level of consciousness: awake and alert Pain management: satisfactory to patient Vital Signs Assessment: post-procedure vital signs reviewed and stable Respiratory status: spontaneous breathing Cardiovascular status: stable Postop Assessment: no apparent nausea or vomiting Anesthetic complications: no     Last Vitals:  Vitals:   05/15/18 0820 05/15/18 0830  BP: 138/76 130/61  Pulse: 72 (!) 59  Resp: 13 12  Temp: 36.6 C   SpO2: 100% 100%    Last Pain:  Vitals:   05/15/18 0820  TempSrc:   PainSc: 0-No pain                 Hercules Hasler

## 2018-05-15 NOTE — H&P (Signed)
HISTORY AND PHYSICAL INTERVAL NOTE:  05/15/2018  7:18 AM  Chad Grant  has presented today for surgery, with the diagnosis of osteomyelitis,right foot.  The various methods of treatment have been discussed with the patient.  No guarantees were given.  After consideration of risks, benefits and other options for treatment, the patient has consented to surgery.  I have reviewed the patients' chart and labs.    Patient Vitals for the past 24 hrs:  BP Temp Temp src Pulse Resp SpO2  05/15/18 0652 129/64 97.8 F (36.6 C) Oral 61 16 98 %    A history and physical examination was performed in my office.  The patient was reexamined.  There have been no changes to this history and physical examination.  Dallas Schimke, DPM

## 2018-05-15 NOTE — Transfer of Care (Signed)
Immediate Anesthesia Transfer of Care Note  Patient: Chad Grant  Procedure(s) Performed: AMPUTATION RIGHT THIRD TOE (Right Toe)  Patient Location: PACU  Anesthesia Type:General  Level of Consciousness: awake, alert  and patient cooperative  Airway & Oxygen Therapy: Patient Spontanous Breathing  Post-op Assessment: Report given to RN and Post -op Vital signs reviewed and stable  Post vital signs: Reviewed and stable  Last Vitals:  Vitals Value Taken Time  BP 138/76 05/15/2018  8:19 AM  Temp    Pulse 63 05/15/2018  8:22 AM  Resp 11 05/15/2018  8:22 AM  SpO2 100 % 05/15/2018  8:22 AM  Vitals shown include unvalidated device data.  Last Pain:  Vitals:   05/15/18 0652  TempSrc: Oral  PainSc: 0-No pain      Patients Stated Pain Goal: 8 (05/15/18 0254)  Complications: No apparent anesthesia complications

## 2018-05-15 NOTE — Op Note (Signed)
OPERATIVE NOTE  BRIEF OPERATIVE NOTE  DATE OF PROCEDURE 05/15/2018  SURGEON Dallas Schimke, DPM  ASSISTANT SURGEON None  OR STAFF Circulator: Lennox Pippins, RN Scrub Person: Illene Silver, CST   PREOPERATIVE DIAGNOSIS 1.  Osteomyelitis of the third toe, right foot 2.  Diabetes mellitus with peripheral neuropathy  POSTOPERATIVE DIAGNOSIS Same  PROCEDURE Amputation of the right third toe at the metatarsophalangeal joint (CPT [phone])  ANESTHESIA Monitor Anesthesia Care   HEMOSTASIS Pneumatic ankle tourniquet set at 250 mmHg  ESTIMATED BLOOD LOSS Minimal (<5 cc)  MATERIALS USED None  INJECTABLES 0.5% Marcaine plain  PATHOLOGY Third toe, right foot  COMPLICATIONS None  INDICATIONS:  The patient presented to my office on 05/08/2018.  He was referred to my office by Dr. Lovie Macadamia for evaluation and treatment of exposed bone of his right third toe.  Radiographs revealed erosive changes consistent with osteomyelitis.  He was placed on doxycycline.  A noninvasive arterial study was scheduled.  The study was performed on 05/11/2018 and revealed triphasic waveforms of the right lower extremity.  Amputation of the right third toe was recommended and scheduled for today.  DESCRIPTION OF THE PROCEDURE:  The patient was brought to the operating room and placed on the operative table in the supine position.  A pneumatic ankle tourniquet was placed about the patient's right ankle.  A timeout was performed.  The right foot was anesthetized with 0.5% Marcaine plain.  The foot was scrubbed prepped and draped in the usual sterile manner.  A second timeout was performed.  The right foot was elevated and the pneumatic ankle tourniquet inflated to 250 mmHg.  Attention was directed to the right third toe.  A full-thickness ulceration was encountered along the dorsal aspect of the proximal interphalangeal joint.  The head of the proximal phalanx was visible.  Erosive changes were  observed.  2 converging semielliptical incisions were made encompassing the right third toe circumferentially.  Dissection was continued deep down to the level of the third metatarsophalangeal joint.  The joint was disarticulated.  The third toe was freed of all soft tissue attachements, passed from the operative field and sent to pathology for evaluation.    The head of the third metatarsal was evaluated.  No abnormal findings were observed.  The surgical wound was irrigated with copious amounts of sterile irrigant.  The subcutaneous structures were reapproximated using 4-0 Vicryl.  The skin was reapproximated using 4-0 nylon.  A sterile compressive dressing was applied to the right foot.  The pneumatic ankle tourniquet was deflated and a prompt hyperemic response was noted to all digits of the right foot.  The patient tolerated the procedure well.  He was transferred from the operating room to the postanesthesia care unit with vital signs stable and vascular status intact to all remaining digits of the right foot.

## 2018-05-15 NOTE — Brief Op Note (Signed)
BRIEF OPERATIVE NOTE  DATE OF PROCEDURE 05/15/2018  SURGEON Dallas Schimke, DPM  ASSISTANT SURGEON None  OR STAFF Circulator: Lennox Pippins, RN Scrub Person: Illene Silver, CST   PREOPERATIVE DIAGNOSIS 1.  Osteomyelitis of the third toe, right foot 2.  Diabetes mellitus with peripheral neuropathy  POSTOPERATIVE DIAGNOSIS Same  PROCEDURE Amputation of the right third toe at the metatarsophalangeal joint  ANESTHESIA Monitor Anesthesia Care   HEMOSTASIS Pneumatic ankle tourniquet set at 250 mmHg  ESTIMATED BLOOD LOSS Minimal (<5 cc)  MATERIALS USED None  INJECTABLES 0.5% Marcaine plain  PATHOLOGY Third toe, right foot  COMPLICATIONS None

## 2018-05-18 ENCOUNTER — Encounter (HOSPITAL_COMMUNITY): Payer: Self-pay | Admitting: Podiatry

## 2018-06-16 ENCOUNTER — Other Ambulatory Visit: Payer: Self-pay

## 2018-06-16 ENCOUNTER — Encounter (HOSPITAL_COMMUNITY): Payer: Self-pay | Admitting: Psychiatry

## 2018-06-16 ENCOUNTER — Ambulatory Visit (INDEPENDENT_AMBULATORY_CARE_PROVIDER_SITE_OTHER): Payer: PPO | Admitting: Psychiatry

## 2018-06-16 DIAGNOSIS — F322 Major depressive disorder, single episode, severe without psychotic features: Secondary | ICD-10-CM

## 2018-06-16 DIAGNOSIS — F09 Unspecified mental disorder due to known physiological condition: Secondary | ICD-10-CM | POA: Diagnosis not present

## 2018-06-16 MED ORDER — ARIPIPRAZOLE 5 MG PO TABS: 5.0000 mg | ORAL_TABLET | Freq: Every day | ORAL | 2 refills | Status: DC

## 2018-06-16 MED ORDER — BENZTROPINE MESYLATE 0.5 MG PO TABS: 0.5000 mg | ORAL_TABLET | Freq: Every day | ORAL | 2 refills | Status: DC

## 2018-06-16 MED ORDER — FLUOXETINE HCL 20 MG PO CAPS: 20.0000 mg | ORAL_CAPSULE | Freq: Two times a day (BID) | ORAL | 2 refills | Status: DC

## 2018-06-16 MED ORDER — DONEPEZIL HCL 5 MG PO TABS: 5.0000 mg | ORAL_TABLET | Freq: Every day | ORAL | 2 refills | Status: DC

## 2018-06-16 NOTE — Progress Notes (Signed)
Virtual Visit via Video Note  I connected with Chad Grant on 06/16/18 at  2:40 PM EDT by a video enabled telemedicine application and verified that I am speaking with the correct person using two identifiers.   I discussed the limitations of evaluation and management by telemedicine and the availability of in person appointments. The patient expressed understanding and agreed to proceed.      I discussed the assessment and treatment plan with the patient. The patient was provided an opportunity to ask questions and all were answered. The patient agreed with the plan and demonstrated an understanding of the instructions.   The patient was advised to call back or seek an in-person evaluation if the symptoms worsen or if the condition fails to improve as anticipated.  I provided 15 minutes of non-face-to-face time during this encounter.   Chad Rudereborah Rufina Kimery, MD  [facility] OP Progress Note  06/16/2018 3:03 PM Chad Grant  MRN:  [id number]  Chief Complaint:  Chief Complaint    Depression; Follow-up; Memory Loss     HPI: This patient is a 68year-old married black male who lives with his wife in [place]. He has 2 children who are grown. 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  Apparently when the patient was in better health and used to work he was a happy go lucky person according to his wife. Since she's had the amputation he has gone downhill. In fact he was hospitalized in June for severe depression. He's been on numerous medications in the past. He used to be angry and aggressive but now seems blunted and shut down. He has no interest in life. He sleeps all day or watches TV. He refuses to bathe which is a big problem for his wife. He is very defensive and gets angry when I questioned his wife about his status, almost to the point of paranoia. He claims he can't sleep but his wife reports that he sleeps through  the day and not at night. He denies any pain  The patient returns for follow-up after 4 months with his wife.  They are seen via telemedicine due to the coronavirus pandemic.He had  little to say and did not make much eye contact and continually put his head in his hands.  His wife did not think that he has changed much since our last visit.  He did have amputation of his right third toe due to osteomyelitis last month.  He denies being in any pain right now.  His wife states that he eats pretty well and sleeps fairly well.  He still will go to the bathroom all the time and has to wear depends.  She thinks this is more willful than anything else.  She does realize that he is in early to moderate stage of dementia.  He still needs to have supervision.  He denies being depressed but he certainly seems somewhat blank and disconnected.  He has not made any attempts to harm self or others.  He denies any auditory visual hallucinations.  His wife thinks his medications are still helpful to some degree although she realizes is a progressive disease. Visit Diagnosis:    ICD-10-CM   1. Severe single current episode of major depressive disorder, without psychotic features (HCC) F32.2   2. Cognitive disorder F09     Past Psychiatric History: The patient has been seen in  this office since 2009.  He has been on numerous mood stabilizers and antidepressants  Past Medical History:  Past Medical History:  Diagnosis Date  . Depression   . Diabetes mellitus type II   . Hypercholesteremia   . Hypertension   . S/P BKA (below knee amputation) unilateral (HCC)   . Stroke [facility])    left sided weakness    Past Surgical History:  Procedure Laterality Date  . ABDOMINAL AORTAGRAM N/A 04/29/2012   Procedure: ABDOMINAL Ronny Flurry;  Surgeon: Nada Libman, MD;  Location: [facility] CATH LAB;  Service: Cardiovascular;  Laterality: N/A;  . AMPUTATION Right 05/15/2018   Procedure: AMPUTATION RIGHT THIRD TOE;  Surgeon: Ferman Hamming, DPM;  Location: [facility] ORS;  Service: Podiatry;  Laterality: Right;  . CATARACT EXTRACTION W/PHACO Left 12/04/2015   Procedure: CATARACT EXTRACTION PHACO AND INTRAOCULAR LENS PLACEMENT LEFT EYE CDE=19.92;  Surgeon: Gemma Payor, MD;  Location: [facility] ORS;  Service: Ophthalmology;  Laterality: Left;  left  . cataract removed Bilateral   . left leg amputa     BKA  . left leg amputated    . SHOULDER SURGERY Right    rotator cuff    Family Psychiatric History: See below  Family History:  Family History  Problem Relation Age of Onset  . Heart disease Mother   . Diabetes Father   . Alcohol abuse Brother   . Diabetes Brother   . Diabetes Sister   . Peripheral vascular disease Sister        amputation  . Diabetes Daughter   . Anxiety disorder Neg Hx   . Bipolar disorder Neg Hx   . Dementia Neg Hx   . Depression Neg Hx   . Drug abuse Neg Hx   . Paranoid behavior Neg Hx   . Schizophrenia Neg Hx   . OCD Neg Hx   . Seizures Neg Hx   . Sexual abuse Neg Hx   . Physical abuse Neg Hx   . ADD / ADHD Neg Hx     Social History:  Social History   Socioeconomic History  . Marital status: Married    Spouse name: Not on file  . Number of children: Not on file  . Years of education: Not on file  . Highest education level: Not on file  Occupational History  . Not on file  Social Needs  . Financial resource strain: Not on file  . Food insecurity:    Worry: Not on file    Inability: Not on file  . Transportation needs:    Medical: Not on file    Non-medical: Not on file  Tobacco Use  . Smoking status: Never Smoker  . Smokeless tobacco: Never Used  Substance and Sexual Activity  . Alcohol use: No  . Drug use: No  . Sexual activity: Not Currently  Lifestyle  . Physical activity:    Days per week: Not on file    Minutes per session: Not on file  . Stress: Not on file  Relationships  . Social connections:    Talks on phone: Not on file    Gets together: Not on file     Attends religious service: Not on file    Active member of club or organization: Not on file    Attends meetings of clubs or organizations: Not on file    Relationship status: Not on file  Other Topics Concern  . Not on file  Social History Narrative  . Not on file  Allergies: No Known Allergies  Metabolic Disorder Labs: Lab Results  Component Value Date   HGBA1C 7.2 (H) 05/13/2018   MPG 159.94 05/13/2018   MPG 147 08/14/2007   No results found for: PROLACTIN No results found for: CHOL, TRIG, HDL, CHOLHDL, VLDL, LDLCALC No results found for: TSH  Therapeutic Level Labs: No results found for: LITHIUM No results found for: VALPROATE No components found for:  CBMZ  Current Medications: Current Outpatient Medications  Medication Sig Dispense Refill  . ARIPiprazole (ABILIFY) 5 MG tablet Take 1 tablet (5 mg total) by mouth at bedtime. 90 tablet 2  . benztropine (COGENTIN) 0.5 MG tablet Take 1 tablet (0.5 mg total) by mouth at bedtime. 90 tablet 2  . clopidogrel (PLAVIX) 75 MG tablet Take 1 tablet (75 mg total) by mouth daily.    Marland Kitchen donepezil (ARICEPT) 5 MG tablet Take 1 tablet (5 mg total) by mouth at bedtime. 90 tablet 2  . doxycycline (VIBRAMYCIN) 100 MG capsule Take 100 mg by mouth 2 (two) times daily.    Marland Kitchen FLUoxetine (PROZAC) 20 MG capsule Take 1 capsule (20 mg total) by mouth 2 (two) times daily. 180 capsule 2  . Melatonin 5 MG CAPS Take 5 mg by mouth at bedtime as needed (sleep).    . metFORMIN (GLUCOPHAGE-XR) 500 MG 24 hr tablet Take 500 mg by mouth 2 (two) times a day.    . metoprolol tartrate (LOPRESSOR) 25 MG tablet Take 1 tablet (25 mg total) by mouth 2 (two) times daily. (Patient taking differently: Take 25 mg by mouth daily. )    . simvastatin (ZOCOR) 40 MG tablet Take 40 mg by mouth every evening.    . sitaGLIPtin (JANUVIA) 100 MG tablet Take 1 tablet (100 mg total) by mouth daily. (Patient not taking: Reported on 05/13/2018)     No current facility-administered  medications for this visit.      Musculoskeletal: Strength & Muscle Tone: decreased Gait & Station: unsteady Patient leans: N/A  Psychiatric Specialty Exam: Review of Systems  Constitutional: Positive for malaise/fatigue.  Psychiatric/Behavioral: Positive for depression and memory loss.  All other systems reviewed and are negative.   There were no vitals taken for this visit.There is no height or weight on file to calculate BMI.  General Appearance: Casual and Fairly Groomed  Eye Contact:  Poor  Speech:  Slow  Volume:  Decreased  Mood:  Dysphoric and Irritable  Affect:  Blunt and Flat  Thought Process:  Disorganized  Orientation:  Other:  Oriented to person and situation  Thought Content: Difficult to assess due to sparse answers to questions  Suicidal Thoughts:  No  Homicidal Thoughts:  No  Memory:  Immediate;   Fair Recent;   Poor Remote;   Poor  Judgement:  Impaired  Insight:  Lacking  Psychomotor Activity:  Decreased  Concentration:  Concentration: Poor and Attention Span: Poor  Recall:  Poor  Fund of Knowledge: Poor  Language: Fair  Akathisia:  No  Handed:  Right  AIMS (if indicated): not done  Assets:  Resilience Social Support  ADL's:  Impaired  Cognition: Impaired,  Mild  Sleep:  Good   Screenings: AUDIT     [facility]  Alcohol Use Disorder Identification Test Final Score (AUDIT)  0       Assessment and Plan: This patient is a 68 year old male with a history of depression and moderate dementia.  He seems to be holding his own  with his wife supervision.  He will continue Abilify 5 mg daily at bedtime for augmentation, Prozac 40 mg daily for depression, Aricept 5 mg daily for memory loss and Cogentin 0.5 mg to stop the leg shaking that he experienced presumably from Abilify.  He will return to see me in 4 months   Chad Ruder, MD 06/16/2018, 3:03 PM

## 2018-08-20 DIAGNOSIS — M79672 Pain in left foot: Secondary | ICD-10-CM | POA: Diagnosis not present

## 2018-08-20 DIAGNOSIS — I739 Peripheral vascular disease, unspecified: Secondary | ICD-10-CM | POA: Diagnosis not present

## 2018-08-20 DIAGNOSIS — E1140 Type 2 diabetes mellitus with diabetic neuropathy, unspecified: Secondary | ICD-10-CM | POA: Diagnosis not present

## 2018-08-20 DIAGNOSIS — Z789 Other specified health status: Secondary | ICD-10-CM | POA: Diagnosis not present

## 2018-08-20 DIAGNOSIS — Z299 Encounter for prophylactic measures, unspecified: Secondary | ICD-10-CM | POA: Diagnosis not present

## 2018-08-20 DIAGNOSIS — E1151 Type 2 diabetes mellitus with diabetic peripheral angiopathy without gangrene: Secondary | ICD-10-CM | POA: Diagnosis not present

## 2018-08-20 DIAGNOSIS — E1165 Type 2 diabetes mellitus with hyperglycemia: Secondary | ICD-10-CM | POA: Diagnosis not present

## 2018-08-20 DIAGNOSIS — Z6821 Body mass index (BMI) 21.0-21.9, adult: Secondary | ICD-10-CM | POA: Diagnosis not present

## 2018-08-20 DIAGNOSIS — I10 Essential (primary) hypertension: Secondary | ICD-10-CM | POA: Diagnosis not present

## 2018-08-20 DIAGNOSIS — L110 Acquired keratosis follicularis: Secondary | ICD-10-CM | POA: Diagnosis not present

## 2018-08-20 DIAGNOSIS — E7800 Pure hypercholesterolemia, unspecified: Secondary | ICD-10-CM | POA: Diagnosis not present

## 2018-08-20 DIAGNOSIS — M79671 Pain in right foot: Secondary | ICD-10-CM | POA: Diagnosis not present

## 2018-09-29 DIAGNOSIS — I10 Essential (primary) hypertension: Secondary | ICD-10-CM | POA: Diagnosis not present

## 2018-09-29 DIAGNOSIS — I639 Cerebral infarction, unspecified: Secondary | ICD-10-CM | POA: Diagnosis not present

## 2018-09-29 DIAGNOSIS — Z299 Encounter for prophylactic measures, unspecified: Secondary | ICD-10-CM | POA: Diagnosis not present

## 2018-09-29 DIAGNOSIS — R197 Diarrhea, unspecified: Secondary | ICD-10-CM | POA: Diagnosis not present

## 2018-09-29 DIAGNOSIS — D6869 Other thrombophilia: Secondary | ICD-10-CM | POA: Diagnosis not present

## 2018-09-30 DIAGNOSIS — R197 Diarrhea, unspecified: Secondary | ICD-10-CM | POA: Diagnosis not present

## 2018-10-19 ENCOUNTER — Encounter (HOSPITAL_COMMUNITY): Payer: Self-pay | Admitting: Psychiatry

## 2018-10-19 ENCOUNTER — Ambulatory Visit (INDEPENDENT_AMBULATORY_CARE_PROVIDER_SITE_OTHER): Payer: PPO | Admitting: Psychiatry

## 2018-10-19 ENCOUNTER — Other Ambulatory Visit: Payer: Self-pay

## 2018-10-19 DIAGNOSIS — F322 Major depressive disorder, single episode, severe without psychotic features: Secondary | ICD-10-CM | POA: Diagnosis not present

## 2018-10-19 DIAGNOSIS — F09 Unspecified mental disorder due to known physiological condition: Secondary | ICD-10-CM

## 2018-10-19 MED ORDER — ARIPIPRAZOLE 5 MG PO TABS: 5.0000 mg | ORAL_TABLET | Freq: Every day | ORAL | 2 refills | Status: DC

## 2018-10-19 MED ORDER — BENZTROPINE MESYLATE 0.5 MG PO TABS: 0.5000 mg | ORAL_TABLET | Freq: Every day | ORAL | 2 refills | Status: DC

## 2018-10-19 MED ORDER — DONEPEZIL HCL 5 MG PO TABS: 5.0000 mg | ORAL_TABLET | Freq: Every day | ORAL | 2 refills | Status: DC

## 2018-10-19 MED ORDER — FLUOXETINE HCL 20 MG PO CAPS: 20.0000 mg | ORAL_CAPSULE | Freq: Two times a day (BID) | ORAL | 2 refills | Status: DC

## 2018-10-19 NOTE — Progress Notes (Signed)
Virtual Visit via Video Note  I connected with Chad Grant on 10/19/18 at  2:40 PM EDT by a video enabled telemedicine application and verified that I am speaking with the correct person using two identifiers.   I discussed the limitations of evaluation and management by telemedicine and the availability of in person appointments. The patient expressed understanding and agreed to proceed.     I discussed the assessment and treatment plan with the patient. The patient was provided an opportunity to ask questions and all were answered. The patient agreed with the plan and demonstrated an understanding of the instructions.   The patient was advised to call back or seek an in-person evaluation if the symptoms worsen or if the condition fails to improve as anticipated.  I provided 15 minutes of non-face-to-face time during this encounter.   Diannia Rudereborah Mossie Gilder, MD  [facility]PA/[facility] OP Progress Note  10/19/2018 2:59 PM Chad Grant  MRN:  [id number]  Chief Complaint:  Chief Complaint    Depression; Follow-up; Memory Loss     HPI: This patient is a 68year-old married black male who lives with his wife in [place]. He has 2 children who are grown. 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  Apparently when the patient was in better health and used to work he was a happy go lucky person according to his wife. Since she's had the amputation he has gone downhill. In fact he was hospitalized in June for severe depression. He's been on numerous medications in the past. He used to be angry and aggressive but now seems blunted and shut down. He has no interest in life. He sleeps all day or watches TV. He refuses to bathe which is a big problem for his wife. He is very defensive and gets angry when I questioned his wife about his status, almost to the point of paranoia. He claims he can't sleep but his wife reports that he sleeps through  the day and not at night. He denies any pain  The patient returns for follow-up with his wife after 4 months.  They are again seen via telemedicine.  Wife states that he is doing about the same.  I noticed that he is walking a lot back and forth that she states is hard for him to sit still.  She claims that he moves from one room to the other every few minutes.  I wonder if he has akathisia.  We will try cutting down Abilify to every other night to see if this helps.  In general his mood is okay.  He still definitely has some memory loss associated with dementia.  At times he is irritable but he is redirectable.  He is doing better in terms of his hygiene.  He is sleeping better at night and is no longer sleeping much during the day.  He spends most of his time watching TV Visit Diagnosis:    ICD-10-CM   1. Severe single current episode of major depressive disorder, without psychotic features (HCC)  F32.2   2. Cognitive disorder  F09     Past Psychiatric History: This patient has been seen in our office since 2009.  He has been on numerous mood stabilizers and antidepressants  Past Medical History:  Past Medical History:  Diagnosis Date  . Depression   . Diabetes mellitus type II   . Hypercholesteremia   .  Hypertension   . S/P BKA (below knee amputation) unilateral (HCC)   . Stroke [facility])    left sided weakness    Past Surgical History:  Procedure Laterality Date  . ABDOMINAL AORTAGRAM N/A 04/29/2012   Procedure: ABDOMINAL Ronny Flurry;  Surgeon: Nada Libman, MD;  Location: [facility] CATH LAB;  Service: Cardiovascular;  Laterality: N/A;  . AMPUTATION Right 05/15/2018   Procedure: AMPUTATION RIGHT THIRD TOE;  Surgeon: Ferman Hamming, DPM;  Location: [facility];  Service: Podiatry;  Laterality: Right;  . CATARACT EXTRACTION W/PHACO Left 12/04/2015   Procedure: CATARACT EXTRACTION PHACO AND INTRAOCULAR LENS PLACEMENT LEFT EYE CDE=19.92;  Surgeon: Gemma Payor, MD;  Location: [facility];  Service:  Ophthalmology;  Laterality: Left;  left  . cataract removed Bilateral   . left leg amputa     BKA  . left leg amputated    . SHOULDER SURGERY Right    rotator cuff    Family Psychiatric History: See below  Family History:  Family History  Problem Relation Age of Onset  . Heart disease Mother   . Diabetes Father   . Alcohol abuse Brother   . Diabetes Brother   . Diabetes Sister   . Peripheral vascular disease Sister        amputation  . Diabetes Daughter   . Anxiety disorder Neg Hx   . Bipolar disorder Neg Hx   . Dementia Neg Hx   . Depression Neg Hx   . Drug abuse Neg Hx   . Paranoid behavior Neg Hx   . Schizophrenia Neg Hx   . OCD Neg Hx   . Seizures Neg Hx   . Sexual abuse Neg Hx   . Physical abuse Neg Hx   . ADD / ADHD Neg Hx     Social History:  Social History   Socioeconomic History  . Marital status: Married    Spouse name: Not on file  . Number of children: Not on file  . Years of education: Not on file  . Highest education level: Not on file  Occupational History  . Not on file  Social Needs  . Financial resource strain: Not on file  . Food insecurity    Worry: Not on file    Inability: Not on file  . Transportation needs    Medical: Not on file    Non-medical: Not on file  Tobacco Use  . Smoking status: Never Smoker  . Smokeless tobacco: Never Used  Substance and Sexual Activity  . Alcohol use: No  . Drug use: No  . Sexual activity: Not Currently  Lifestyle  . Physical activity    Days per week: Not on file    Minutes per session: Not on file  . Stress: Not on file  Relationships  . Social Musician on phone: Not on file    Gets together: Not on file    Attends religious service: Not on file    Active member of club or organization: Not on file    Attends meetings of clubs or organizations: Not on file    Relationship status: Not on file  Other Topics Concern  . Not on file  Social History Narrative  . Not on file     Allergies: No Known Allergies  Metabolic Disorder Labs: Lab Results  Component Value Date   HGBA1C 7.2 (H) 05/13/2018   MPG 159.94 05/13/2018   MPG 147 08/14/2007   No results found for: PROLACTIN No results found  for: CHOL, TRIG, HDL, CHOLHDL, VLDL, LDLCALC No results found for: TSH  Therapeutic Level Labs: No results found for: LITHIUM No results found for: VALPROATE No components found for:  CBMZ  Current Medications: Current Outpatient Medications  Medication Sig Dispense Refill  . ARIPiprazole (ABILIFY) 5 MG tablet Take 1 tablet (5 mg total) by mouth at bedtime. 90 tablet 2  . benztropine (COGENTIN) 0.5 MG tablet Take 1 tablet (0.5 mg total) by mouth at bedtime. 90 tablet 2  . clopidogrel (PLAVIX) 75 MG tablet Take 1 tablet (75 mg total) by mouth daily.    Marland Kitchen donepezil (ARICEPT) 5 MG tablet Take 1 tablet (5 mg total) by mouth at bedtime. 90 tablet 2  . doxycycline (VIBRAMYCIN) 100 MG capsule Take 100 mg by mouth 2 (two) times daily.    Marland Kitchen FLUoxetine (PROZAC) 20 MG capsule Take 1 capsule (20 mg total) by mouth 2 (two) times daily. 180 capsule 2  . glipiZIDE (GLUCOTROL) 5 MG tablet Take 5 mg by mouth daily.    . Melatonin 5 MG CAPS Take 5 mg by mouth at bedtime as needed (sleep).    . metoprolol tartrate (LOPRESSOR) 25 MG tablet Take 1 tablet (25 mg total) by mouth 2 (two) times daily. (Patient taking differently: Take 25 mg by mouth daily. )    . simvastatin (ZOCOR) 40 MG tablet Take 40 mg by mouth every evening.     No current facility-administered medications for this visit.      Musculoskeletal: Strength & Muscle Tone: decreased Gait & Station: unsteady Patient leans: N/A  Psychiatric Specialty Exam: Review of Systems  Psychiatric/Behavioral: The patient is nervous/anxious.   All other systems reviewed and are negative.   There were no vitals taken for this visit.There is no height or weight on file to calculate BMI.  General Appearance: Casual and Fairly  Groomed  Eye Contact:  Poor  Speech:  Clear and Coherent  Volume:  Increased  Mood:  Anxious and Irritable  Affect:  Flat  Thought Process:  Goal Directed  Orientation:  Full (Time, Place, and Person)  Thought Content: Rumination   Suicidal Thoughts:  No  Homicidal Thoughts:  No  Memory:  Immediate;   Fair Recent;   Poor Remote;   Poor  Judgement:  Impaired  Insight:  Lacking  Psychomotor Activity:  Restlessness  Concentration:  Concentration: Fair and Attention Span: Fair  Recall:  Poor  Fund of Knowledge: Fair  Language: Good  Akathisia:  Yes  Handed:  Right  AIMS (if indicated): not done  Assets:  Communication Skills Desire for Improvement Resilience Social Support  ADL's:  Impaired  Cognition: Impaired,  Mild  Sleep:  Good   Screenings: AUDIT     Admission (Discharged) from 07/03/2012 in [place] [facility]  Alcohol Use Disorder Identification Test Final Score (AUDIT)  0       Assessment and Plan: This patient is a 68 year old male with a history of depression and moderate dementia.  He seems to be doing okay with his wife supervision but he may be developing akathisia.  We will cut down Abilify to 5 mg every other night.  He will continue Prozac 40 mg daily for depression, Aricept 5 mg daily for memory loss and Cogentin 0.5 mg to start the leg shaking at the experience from Abilify.  He will return to see me in 4 months   Levonne Spiller, MD 10/19/2018, 2:59 PM

## 2018-10-29 DIAGNOSIS — E1140 Type 2 diabetes mellitus with diabetic neuropathy, unspecified: Secondary | ICD-10-CM | POA: Diagnosis not present

## 2018-10-29 DIAGNOSIS — L110 Acquired keratosis follicularis: Secondary | ICD-10-CM | POA: Diagnosis not present

## 2018-10-29 DIAGNOSIS — M79671 Pain in right foot: Secondary | ICD-10-CM | POA: Diagnosis not present

## 2018-10-29 DIAGNOSIS — M79672 Pain in left foot: Secondary | ICD-10-CM | POA: Diagnosis not present

## 2018-10-29 DIAGNOSIS — E1151 Type 2 diabetes mellitus with diabetic peripheral angiopathy without gangrene: Secondary | ICD-10-CM | POA: Diagnosis not present

## 2018-11-26 DIAGNOSIS — I739 Peripheral vascular disease, unspecified: Secondary | ICD-10-CM | POA: Diagnosis not present

## 2018-11-26 DIAGNOSIS — E1165 Type 2 diabetes mellitus with hyperglycemia: Secondary | ICD-10-CM | POA: Diagnosis not present

## 2018-11-26 DIAGNOSIS — E7800 Pure hypercholesterolemia, unspecified: Secondary | ICD-10-CM | POA: Diagnosis not present

## 2018-11-26 DIAGNOSIS — E1151 Type 2 diabetes mellitus with diabetic peripheral angiopathy without gangrene: Secondary | ICD-10-CM | POA: Diagnosis not present

## 2018-11-26 DIAGNOSIS — Z299 Encounter for prophylactic measures, unspecified: Secondary | ICD-10-CM | POA: Diagnosis not present

## 2018-11-26 DIAGNOSIS — Z6821 Body mass index (BMI) 21.0-21.9, adult: Secondary | ICD-10-CM | POA: Diagnosis not present

## 2018-11-26 DIAGNOSIS — I10 Essential (primary) hypertension: Secondary | ICD-10-CM | POA: Diagnosis not present

## 2018-12-03 ENCOUNTER — Encounter: Payer: Self-pay | Admitting: Gastroenterology

## 2018-12-15 ENCOUNTER — Ambulatory Visit (INDEPENDENT_AMBULATORY_CARE_PROVIDER_SITE_OTHER): Payer: PPO | Admitting: Gastroenterology

## 2018-12-15 ENCOUNTER — Other Ambulatory Visit: Payer: Self-pay

## 2018-12-15 ENCOUNTER — Encounter: Payer: Self-pay | Admitting: Gastroenterology

## 2018-12-15 ENCOUNTER — Ambulatory Visit: Payer: PPO | Admitting: Gastroenterology

## 2018-12-15 DIAGNOSIS — R194 Change in bowel habit: Secondary | ICD-10-CM | POA: Diagnosis not present

## 2018-12-15 DIAGNOSIS — R634 Abnormal weight loss: Secondary | ICD-10-CM

## 2018-12-15 MED ORDER — PEG 3350-KCL-NA BICARB-NACL 420 G PO SOLR: 4000.0000 mL | ORAL | 0 refills | Status: DC

## 2018-12-15 NOTE — Patient Instructions (Addendum)
I am requesting the stool studies and labs from your doctor. This will help [place] decide what to do next.  We are arranging a colonoscopy with Dr. Gala Romney in the near future. Do not take your oral diabetes medication that morning of the procedure.  I have given you pancreatic enzymes. Take 2 capsules with each meal while eating and 1 capsule with snacks, no more than 8 a day. Let me know how this works for you!  Further recommendations to follow!  It was a pleasure to see you today. I want to create trusting relationships with patients to provide genuine, compassionate, and quality care. I value your feedback. If you receive a survey regarding your visit,  I greatly appreciate you taking time to fill this out.   Annitta Needs, PhD, ANP-BC [facility]

## 2018-12-15 NOTE — Progress Notes (Signed)
Primary Care Physician:  Chad Blitz, MD Primary Gastroenterologist:  Dr. Gala Romney  Chief Complaint  Patient presents with  . Diarrhea    having to take imodium daily    HPI:   Chad Grant is a 68 y.o. male presenting today at the request of Dr. Manuella Ghazi due to diarrhea. Wife, Chad Grant, is present with him today. History of mild dementia per wife, and he prefers to have her share the history.   Notes intermittent diarrhea for several months. At one point was taking Metamucil without help. Using lots of anti-diarrheal medications. Will check up then start up all over again. Sometimes with leaking stool. Wears depends 24/7. Fecal smearing. Now more conscious of trying to get to the bathroom. At first thought was just [place] food. Fried foods or grease tends to worsen symptoms. Stool studies possibly that she remembers. Prescribed course of flagyl in Sept 2020 for a brief course. No rectal bleeding. Used to weigh 160-170. Now 146. Been gradually losing weight for the last year. Eating multiple times a day but not gaining weight. Some lower back pain. No prior colonoscopy. Not postprandial stool. On looser days, having to change 2-3 times per day. Feels like stool was better with Flagyl. Wife provides information.  Was on metformin and this has since stopped. No prior colonoscopy.     Past Medical History:  Diagnosis Date  . Depression   . Diabetes mellitus type II   . Hypercholesteremia   . Hypertension   . S/P BKA (below knee amputation) unilateral ([place])   . Stroke [facility])    left sided weakness    Past Surgical History:  Procedure Laterality Date  . ABDOMINAL AORTAGRAM N/A 04/29/2012   Procedure: ABDOMINAL Maxcine Ham;  Surgeon: Serafina Mitchell, MD;  Location: [facility] CATH LAB;  Service: Cardiovascular;  Laterality: N/A;  . AMPUTATION Right 05/15/2018   Procedure: AMPUTATION RIGHT THIRD TOE;  Surgeon: Caprice Beaver, DPM;  Location: [facility] ORS;  Service: Podiatry;  Laterality: Right;  .  CATARACT EXTRACTION W/PHACO Left 12/04/2015   Procedure: CATARACT EXTRACTION PHACO AND INTRAOCULAR LENS PLACEMENT LEFT EYE CDE=19.92;  Surgeon: Tonny Branch, MD;  Location: [facility] ORS;  Service: Ophthalmology;  Laterality: Left;  left  . cataract removed Bilateral   . left leg amputa     BKA  . left leg amputated    . SHOULDER SURGERY Right    rotator cuff    Current Outpatient Medications  Medication Sig Dispense Refill  . ARIPiprazole (ABILIFY) 5 MG tablet Take 1 tablet (5 mg total) by mouth at bedtime. 90 tablet 2  . benztropine (COGENTIN) 0.5 MG tablet Take 1 tablet (0.5 mg total) by mouth at bedtime. 90 tablet 2  . clopidogrel (PLAVIX) 75 MG tablet Take 1 tablet (75 mg total) by mouth daily.    Marland Kitchen donepezil (ARICEPT) 5 MG tablet Take 1 tablet (5 mg total) by mouth at bedtime. 90 tablet 2  . FLUoxetine (PROZAC) 20 MG capsule Take 1 capsule (20 mg total) by mouth 2 (two) times daily. (Patient taking differently: Take 20 mg by mouth daily. ) 180 capsule 2  . glipiZIDE (GLUCOTROL) 5 MG tablet Take 5 mg by mouth daily.    Marland Kitchen loperamide (IMODIUM) 2 MG capsule Take by mouth as needed for diarrhea or loose stools.    . Melatonin 5 MG CAPS Take 5 mg by mouth at bedtime as needed (sleep).    . metoprolol tartrate (LOPRESSOR) 25 MG tablet Take 1 tablet (25 mg total)  by mouth 2 (two) times daily. (Patient taking differently: Take 25 mg by mouth daily. )    . simvastatin (ZOCOR) 40 MG tablet Take 40 mg by mouth every evening.    Marland Kitchen. doxycycline (VIBRAMYCIN) 100 MG capsule Take 100 mg by mouth 2 (two) times daily.     No current facility-administered medications for this visit.     Allergies as of 12/15/2018  . (No Known Allergies)    Family History  Problem Relation Age of Onset  . Heart disease Mother   . Diabetes Father   . Alcohol abuse Brother   . Diabetes Brother   . Diabetes Sister   . Peripheral vascular disease Sister        amputation  . Diabetes Daughter   . Anxiety disorder Neg Hx    . Bipolar disorder Neg Hx   . Dementia Neg Hx   . Depression Neg Hx   . Drug abuse Neg Hx   . Paranoid behavior Neg Hx   . Schizophrenia Neg Hx   . OCD Neg Hx   . Seizures Neg Hx   . Sexual abuse Neg Hx   . Physical abuse Neg Hx   . ADD / ADHD Neg Hx     Social History   Socioeconomic History  . Marital status: Married    Spouse name: Not on file  . Number of children: Not on file  . Years of education: Not on file  . Highest education level: Not on file  Occupational History  . Not on file  Social Needs  . Financial resource strain: Not on file  . Food insecurity    Worry: Not on file    Inability: Not on file  . Transportation needs    Medical: Not on file    Non-medical: Not on file  Tobacco Use  . Smoking status: Never Smoker  . Smokeless tobacco: Never Used  Substance and Sexual Activity  . Alcohol use: No  . Drug use: No  . Sexual activity: Not Currently  Lifestyle  . Physical activity    Days per week: Not on file    Minutes per session: Not on file  . Stress: Not on file  Relationships  . Social Musicianconnections    Talks on phone: Not on file    Gets together: Not on file    Attends religious service: Not on file    Active member of club or organization: Not on file    Attends meetings of clubs or organizations: Not on file    Relationship status: Not on file  . Intimate partner violence    Fear of current or ex partner: Not on file    Emotionally abused: Not on file    Physically abused: Not on file    Forced sexual activity: Not on file  Other Topics Concern  . Not on file  Social History Narrative  . Not on file    Review of Systems: Limited due to cognition  Physical Exam: BP 112/61   Pulse 62   Temp 97.6 F (36.4 C) (Oral)   Ht 5' 9.5" (1.765 m)   Wt 146 lb 9.6 oz (66.5 kg)   BMI 21.34 kg/m  General:   Alert and oriented. Pleasant and cooperative. Knows the city but not the year. Knows the month.  Head:  Normocephalic and atraumatic.  Eyes:  Without icterus, sclera clear and conjunctiva pink.  pink.  Lungs:  Clear to auscultation bilaterally.  Heart:  S1,  S2 present without murmurs appreciated.  Abdomen:  +BS, soft, non-tender and non-distended. No HSM noted. No guarding or rebound. No masses appreciated.  Rectal:  Deferred  Msk:  Left BKA Extremities:  Without edema. Neurologic:  Alert and  oriented to person, place.  Psych:  Flat affect but pleasant.   ASSESSMENT: [name] is a 68 y.o. male presenting today with intermittent diarrhea for several months, reported weight loss over the past year, no prior colonoscopy. Appears stool studies may have been done by PCP. I do note he was exposed to antibiotics earlier this year, and there is documentation of Flagyl prescribed by PCP in Sept 2020. I am retrieving stool study reports to rule out possible history of Cdiff. If this is the case, likely dealing with a more post-infectious presentation as he does have soft stools intermittently. Weight loss is concerning. With no prior colonoscopy, will pursue in the near future.  Until we retrieve stool studies, I will start him empirically on Creon 72,000 units per meal and 36,000 units with snacks. Long-standing history of diabetes and poor historian. I feel enzymes will have minimal concerning side effects and possibly help in interim until we retrieve all information. May need to trial Bentyl or Levsin in interim. Further recommendations following obtaining stool studies from PCP.    PLAN: Obtain outside stool reports Creon empirically for now Proceed with TCS with Dr. Jena Gauss in near future: the risks, benefits, and alternatives have been discussed with the patient in detail. The patient states understanding and desires to proceed. Propofol due to polypharmacy.   Gelene Mink, PhD, ANP-BC [facility]

## 2018-12-21 ENCOUNTER — Encounter: Payer: Self-pay | Admitting: Gastroenterology

## 2018-12-21 NOTE — Progress Notes (Signed)
Chad Grant: can we see if any stool studies are available from PCP? We had requested last week at time of office visit. Thanks!

## 2018-12-22 NOTE — Progress Notes (Signed)
Requested records.

## 2018-12-24 ENCOUNTER — Telehealth: Payer: Self-pay | Admitting: *Deleted

## 2018-12-24 NOTE — Telephone Encounter (Signed)
Pt's wife called in and requested Creon samples.  Says medication is working good.  Would like RX sent to [facility].  2 boxes left up front for pt to pick up.

## 2018-12-25 MED ORDER — PANCRELIPASE (LIP-PROT-AMYL) 36000-114000 UNITS PO CPEP: 72000.0000 [IU] | ORAL_CAPSULE | Freq: Three times a day (TID) | ORAL | 3 refills | Status: DC

## 2018-12-25 NOTE — Addendum Note (Signed)
Addended by: Annitta Needs on: 12/25/2018 09:42 AM   Modules accepted: Orders

## 2018-12-25 NOTE — Telephone Encounter (Signed)
Completed.

## 2019-01-20 ENCOUNTER — Telehealth: Payer: Self-pay | Admitting: *Deleted

## 2019-01-20 NOTE — Telephone Encounter (Signed)
Noted  

## 2019-01-20 NOTE — Telephone Encounter (Signed)
Received a call from spouse and patient. They want to cancel the procedure for now. They did not wish to r/s. Called endo and made aware. FYI to AB

## 2019-01-28 DIAGNOSIS — E1151 Type 2 diabetes mellitus with diabetic peripheral angiopathy without gangrene: Secondary | ICD-10-CM | POA: Diagnosis not present

## 2019-01-28 DIAGNOSIS — E1140 Type 2 diabetes mellitus with diabetic neuropathy, unspecified: Secondary | ICD-10-CM | POA: Diagnosis not present

## 2019-01-28 DIAGNOSIS — M79671 Pain in right foot: Secondary | ICD-10-CM | POA: Diagnosis not present

## 2019-01-28 DIAGNOSIS — M79672 Pain in left foot: Secondary | ICD-10-CM | POA: Diagnosis not present

## 2019-01-28 DIAGNOSIS — L110 Acquired keratosis follicularis: Secondary | ICD-10-CM | POA: Diagnosis not present

## 2019-02-01 ENCOUNTER — Encounter: Payer: Self-pay | Admitting: Gastroenterology

## 2019-02-01 NOTE — Progress Notes (Signed)
Sept 2020 labs:  Cdiff sample not completed as was not received refrigerated. Negative fecal leukocytes. Negative stool culture.

## 2019-02-17 ENCOUNTER — Ambulatory Visit (INDEPENDENT_AMBULATORY_CARE_PROVIDER_SITE_OTHER): Payer: PPO | Admitting: Psychiatry

## 2019-02-17 ENCOUNTER — Encounter (HOSPITAL_COMMUNITY): Payer: Self-pay | Admitting: Psychiatry

## 2019-02-17 ENCOUNTER — Other Ambulatory Visit: Payer: Self-pay

## 2019-02-17 DIAGNOSIS — F09 Unspecified mental disorder due to known physiological condition: Secondary | ICD-10-CM

## 2019-02-17 DIAGNOSIS — F322 Major depressive disorder, single episode, severe without psychotic features: Secondary | ICD-10-CM | POA: Diagnosis not present

## 2019-02-17 MED ORDER — ARIPIPRAZOLE 5 MG PO TABS: 5.0000 mg | ORAL_TABLET | Freq: Every day | ORAL | 2 refills | Status: DC

## 2019-02-17 MED ORDER — FLUOXETINE HCL 20 MG PO CAPS: 20.0000 mg | ORAL_CAPSULE | Freq: Every day | ORAL | 2 refills | Status: DC

## 2019-02-17 MED ORDER — BENZTROPINE MESYLATE 0.5 MG PO TABS: 0.5000 mg | ORAL_TABLET | Freq: Every day | ORAL | 2 refills | Status: DC

## 2019-02-17 MED ORDER — DONEPEZIL HCL 5 MG PO TABS: 5.0000 mg | ORAL_TABLET | Freq: Every day | ORAL | 2 refills | Status: DC

## 2019-02-17 NOTE — Progress Notes (Signed)
Virtual Visit via Telephone Note  I connected with Chad Grant on 02/17/19 at  1:40 PM EST by telephone and verified that I am speaking with the correct person using two identifiers.   I discussed the limitations, risks, security and privacy concerns of performing an evaluation and management service by telephone and the availability of in person appointments. I also discussed with the patient that there may be a patient responsible charge related to this service. The patient expressed understanding and agreed to proceed.   I discussed the assessment and treatment plan with the patient. The patient was provided an opportunity to ask questions and all were answered. The patient agreed with the plan and demonstrated an understanding of the instructions.   The patient was advised to call back or seek an in-person evaluation if the symptoms worsen or if the condition fails to improve as anticipated.  I provided 15 minutes of non-face-to-face time during this encounter.   Diannia Ruder, MD  [facility] MD/PA/NP OP Progress Note  02/17/2019 1:55 PM Chad Grant  MRN:  [id number]  Chief Complaint:  Chief Complaint    Anxiety; Agitation; Follow-up     HPI: This patient is a 69year-old married black male who lives with his wife in [place]. He has 2 children who are grown. 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  Apparently when the patient was in better health and used to work he was a happy go lucky person according to his wife. Since she's had the amputation he has gone downhill. In fact he was hospitalized in June for severe depression. He's been on numerous medications in the past. He used to be angry and aggressive but now seems blunted and shut down. He has no interest in life. He sleeps all day or watches TV. He refuses to bathe which is a big problem for his wife. He is very defensive and gets angry when I questioned his  wife about his status, almost to the point of paranoia. He claims he can't sleep but his wife reports that he sleeps through the day and not at night. He denies any pain  The patient and wife return after 4 months.  Overall the wife reports that he is doing fairly well.  Last time he seemed to have some akathisia and I suggested cutting down the Abilify.  The wife forgot to do this but he seems to be doing better because she is having him walk every day.  She did cut down his Prozac to 20 mg daily from 40 mg because it was making him very drowsy.  He claims he is not sleeping at night but the wife states that he is sleeping quite well.  For the first time in a long time he started to shave again.  He still has variable memory issues.  His irritability seems to have calmed down.  He has been worked up from GI because of loose stools but his wife has declined the colonoscopy for now because she thinks he would not be able to follow the directions of the "would make a mess." Visit Diagnosis:    ICD-10-CM   1. Severe single current episode of major depressive disorder, without psychotic features (HCC)  F32.2   2. Cognitive disorder  F09     Past Psychiatric History: The patient has been seen in our office since 2009.  He has  been on numerous mood stabilizers and antidepressants  Past Medical History:  Past Medical History:  Diagnosis Date  . Dementia (HCC)   . Depression   . Diabetes mellitus type II   . Hypercholesteremia   . Hypertension   . S/P BKA (below knee amputation) unilateral (HCC)   . Stroke [facility]) 2013   left sided weakness  . TIA (transient ischemic attack)     Past Surgical History:  Procedure Laterality Date  . ABDOMINAL AORTAGRAM N/A 04/29/2012   Procedure: ABDOMINAL Ronny Flurry;  Surgeon: Nada Libman, MD;  Location: [facility] CATH LAB;  Service: Cardiovascular;  Laterality: N/A;  . AMPUTATION Right 05/15/2018   Procedure: AMPUTATION RIGHT THIRD TOE;  Surgeon: Ferman Hamming, DPM;   Location: [facility] ORS;  Service: Podiatry;  Laterality: Right;  . CATARACT EXTRACTION W/PHACO Left 12/04/2015   Procedure: CATARACT EXTRACTION PHACO AND INTRAOCULAR LENS PLACEMENT LEFT EYE CDE=19.92;  Surgeon: Gemma Payor, MD;  Location: [facility] ORS;  Service: Ophthalmology;  Laterality: Left;  left  . cataract removed Bilateral   . left leg amputa     BKA  . left leg amputated    . SHOULDER SURGERY Right    rotator cuff    Family Psychiatric History: See below  Family History:  Family History  Problem Relation Age of Onset  . Heart disease Mother   . Diabetes Father   . Alcohol abuse Brother   . Diabetes Brother   . Diabetes Sister   . Peripheral vascular disease Sister        amputation  . Diabetes Daughter   . Anxiety disorder Neg Hx   . Bipolar disorder Neg Hx   . Dementia Neg Hx   . Depression Neg Hx   . Drug abuse Neg Hx   . Paranoid behavior Neg Hx   . Schizophrenia Neg Hx   . OCD Neg Hx   . Seizures Neg Hx   . Sexual abuse Neg Hx   . Physical abuse Neg Hx   . ADD / ADHD Neg Hx   . Colon cancer Neg Hx   . Colon polyps Neg Hx     Social History:  Social History   Socioeconomic History  . Marital status: Married    Spouse name: Not on file  . Number of children: Not on file  . Years of education: Not on file  . Highest education level: Not on file  Occupational History  . Not on file  Tobacco Use  . Smoking status: Never Smoker  . Smokeless tobacco: Never Used  Substance and Sexual Activity  . Alcohol use: No    Comment: in the past but not since amputation in 2009, drank alcohol on weekends   . Drug use: No  . Sexual activity: Not Currently  Other Topics Concern  . Not on file  Social History Narrative  . Not on file   Social Determinants of Health   Financial Resource Strain:   . Difficulty of Paying Living Expenses: Not on file  Food Insecurity:   . Worried About Programme researcher, broadcasting/film/video in the Last Year: Not on file  . Ran Out of Food in the Last Year:  Not on file  Transportation Needs:   . Lack of Transportation (Medical): Not on file  . Lack of Transportation (Non-Medical): Not on file  Physical Activity:   . Days of Exercise per Week: Not on file  . Minutes of Exercise per Session: Not on file  Stress:   .  Feeling of Stress : Not on file  Social Connections:   . Frequency of Communication with Friends and Family: Not on file  . Frequency of Social Gatherings with Friends and Family: Not on file  . Attends Religious Services: Not on file  . Active Member of Clubs or Organizations: Not on file  . Attends Archivist Meetings: Not on file  . Marital Status: Not on file    Allergies: No Known Allergies  Metabolic Disorder Labs: Lab Results  Component Value Date   HGBA1C 7.2 (H) 05/13/2018   MPG 159.94 05/13/2018   MPG 147 08/14/2007   No results found for: PROLACTIN No results found for: CHOL, TRIG, HDL, CHOLHDL, VLDL, LDLCALC No results found for: TSH  Therapeutic Level Labs: No results found for: LITHIUM No results found for: VALPROATE No components found for:  CBMZ  Current Medications: Current Outpatient Medications  Medication Sig Dispense Refill  . ARIPiprazole (ABILIFY) 5 MG tablet Take 1 tablet (5 mg total) by mouth at bedtime. 90 tablet 2  . benztropine (COGENTIN) 0.5 MG tablet Take 1 tablet (0.5 mg total) by mouth at bedtime. 90 tablet 2  . clopidogrel (PLAVIX) 75 MG tablet Take 1 tablet (75 mg total) by mouth daily.    Marland Kitchen donepezil (ARICEPT) 5 MG tablet Take 1 tablet (5 mg total) by mouth at bedtime. 90 tablet 2  . FLUoxetine (PROZAC) 20 MG capsule Take 1 capsule (20 mg total) by mouth daily. 90 capsule 2  . glipiZIDE (GLUCOTROL) 5 MG tablet Take 5 mg by mouth daily.    . lipase/protease/amylase (CREON) 36000 UNITS CPEP capsule Take 2 capsules (72,000 Units total) by mouth 3 (three) times daily with meals. 1 capsule with snacks 240 capsule 3  . loperamide (IMODIUM) 2 MG capsule Take by mouth as needed  for diarrhea or loose stools.    . Melatonin 5 MG CAPS Take 5 mg by mouth at bedtime as needed (sleep).    . metoprolol tartrate (LOPRESSOR) 25 MG tablet Take 1 tablet (25 mg total) by mouth 2 (two) times daily. (Patient taking differently: Take 25 mg by mouth daily. )    . polyethylene glycol-electrolytes (TRILYTE) 420 g solution Take 4,000 mLs by mouth as directed. 4000 mL 0  . simvastatin (ZOCOR) 40 MG tablet Take 40 mg by mouth every evening.     No current facility-administered medications for this visit.     Musculoskeletal: Strength & Muscle Tone: decreased Gait & Station: unable to stand Patient leans: N/A  Psychiatric Specialty Exam: Review of Systems  Constitutional: Positive for appetite change.  Gastrointestinal: Positive for diarrhea.  Psychiatric/Behavioral: Positive for confusion.  All other systems reviewed and are negative.   There were no vitals taken for this visit.There is no height or weight on file to calculate BMI.  General Appearance: NA  Eye Contact:  NA  Speech:  Clear and Coherent  Volume:  Normal  Mood:  Irritable  Affect:  NA  Thought Process:  Goal Directed  Orientation:  Full (Time, Place, and Person)  Thought Content: WDL   Suicidal Thoughts:  No  Homicidal Thoughts:  No  Memory:  Immediate;   Fair Recent;   Poor Remote;   Poor  Judgement:  Impaired  Insight:  Lacking  Psychomotor Activity:  Decreased  Concentration:  Concentration: Good and Attention Span: Fair  Recall:  [employer] of Knowledge: Fair  Language: Good  Akathisia:  No  Handed:  Right  AIMS (if indicated): not  done  Assets:  Communication Skills Desire for Improvement Resilience Social Support  ADL's:  Impaired  Cognition: Impaired,  Mild  Sleep:  Good   Screenings: AUDIT     [facility]  Alcohol Use Disorder Identification Test Final Score (AUDIT)  0       Assessment and Plan: This  patient is a 69 year old male with a history of depression and moderate dementia.  He seems to be doing fairly well with his wife supervision.  He will continue Abilify 5 mg every night for agitation, Prozac 20 mg daily for depression, Aricept 5 mg daily for memory loss and Cogentin 0.5 mg daily to diminish side effects from Abilify.  He will return to see me in 4 months   Diannia Ruder, MD 02/17/2019, 1:55 PM

## 2019-02-21 DIAGNOSIS — E1165 Type 2 diabetes mellitus with hyperglycemia: Secondary | ICD-10-CM | POA: Diagnosis not present

## 2019-02-21 DIAGNOSIS — E7800 Pure hypercholesterolemia, unspecified: Secondary | ICD-10-CM | POA: Diagnosis not present

## 2019-02-22 DIAGNOSIS — Z1211 Encounter for screening for malignant neoplasm of colon: Secondary | ICD-10-CM | POA: Diagnosis not present

## 2019-02-22 DIAGNOSIS — Z7189 Other specified counseling: Secondary | ICD-10-CM | POA: Diagnosis not present

## 2019-02-22 DIAGNOSIS — R5383 Other fatigue: Secondary | ICD-10-CM | POA: Diagnosis not present

## 2019-02-22 DIAGNOSIS — Z1331 Encounter for screening for depression: Secondary | ICD-10-CM | POA: Diagnosis not present

## 2019-02-22 DIAGNOSIS — Z2821 Immunization not carried out because of patient refusal: Secondary | ICD-10-CM | POA: Diagnosis not present

## 2019-02-22 DIAGNOSIS — Z299 Encounter for prophylactic measures, unspecified: Secondary | ICD-10-CM | POA: Diagnosis not present

## 2019-02-22 DIAGNOSIS — Z0000 Encounter for general adult medical examination without abnormal findings: Secondary | ICD-10-CM | POA: Diagnosis not present

## 2019-02-22 DIAGNOSIS — I10 Essential (primary) hypertension: Secondary | ICD-10-CM | POA: Diagnosis not present

## 2019-02-22 DIAGNOSIS — E7800 Pure hypercholesterolemia, unspecified: Secondary | ICD-10-CM | POA: Diagnosis not present

## 2019-02-22 DIAGNOSIS — I779 Disorder of arteries and arterioles, unspecified: Secondary | ICD-10-CM | POA: Diagnosis not present

## 2019-02-22 DIAGNOSIS — Z6821 Body mass index (BMI) 21.0-21.9, adult: Secondary | ICD-10-CM | POA: Diagnosis not present

## 2019-02-22 DIAGNOSIS — Z1339 Encounter for screening examination for other mental health and behavioral disorders: Secondary | ICD-10-CM | POA: Diagnosis not present

## 2019-03-01 DIAGNOSIS — I63039 Cerebral infarction due to thrombosis of unspecified carotid artery: Secondary | ICD-10-CM | POA: Diagnosis not present

## 2019-03-01 DIAGNOSIS — Z125 Encounter for screening for malignant neoplasm of prostate: Secondary | ICD-10-CM | POA: Diagnosis not present

## 2019-03-01 DIAGNOSIS — E7800 Pure hypercholesterolemia, unspecified: Secondary | ICD-10-CM | POA: Diagnosis not present

## 2019-03-01 DIAGNOSIS — Z79899 Other long term (current) drug therapy: Secondary | ICD-10-CM | POA: Diagnosis not present

## 2019-03-01 DIAGNOSIS — I639 Cerebral infarction, unspecified: Secondary | ICD-10-CM | POA: Diagnosis not present

## 2019-03-01 DIAGNOSIS — R5383 Other fatigue: Secondary | ICD-10-CM | POA: Diagnosis not present

## 2019-03-12 ENCOUNTER — Other Ambulatory Visit (HOSPITAL_COMMUNITY): Payer: PPO

## 2019-03-15 ENCOUNTER — Encounter (HOSPITAL_COMMUNITY): Payer: Self-pay

## 2019-03-15 ENCOUNTER — Ambulatory Visit (HOSPITAL_COMMUNITY): Admit: 2019-03-15 | Payer: PPO | Admitting: Internal Medicine

## 2019-03-15 SURGERY — COLONOSCOPY WITH PROPOFOL
Anesthesia: Monitor Anesthesia Care

## 2019-03-18 DIAGNOSIS — E1165 Type 2 diabetes mellitus with hyperglycemia: Secondary | ICD-10-CM | POA: Diagnosis not present

## 2019-03-18 DIAGNOSIS — Z6822 Body mass index (BMI) 22.0-22.9, adult: Secondary | ICD-10-CM | POA: Diagnosis not present

## 2019-03-18 DIAGNOSIS — I10 Essential (primary) hypertension: Secondary | ICD-10-CM | POA: Diagnosis not present

## 2019-03-18 DIAGNOSIS — Z789 Other specified health status: Secondary | ICD-10-CM | POA: Diagnosis not present

## 2019-03-18 DIAGNOSIS — Z89512 Acquired absence of left leg below knee: Secondary | ICD-10-CM | POA: Diagnosis not present

## 2019-03-18 DIAGNOSIS — Z299 Encounter for prophylactic measures, unspecified: Secondary | ICD-10-CM | POA: Diagnosis not present

## 2019-03-21 DIAGNOSIS — F329 Major depressive disorder, single episode, unspecified: Secondary | ICD-10-CM | POA: Diagnosis not present

## 2019-03-21 DIAGNOSIS — I10 Essential (primary) hypertension: Secondary | ICD-10-CM | POA: Diagnosis not present

## 2019-05-03 DIAGNOSIS — E1151 Type 2 diabetes mellitus with diabetic peripheral angiopathy without gangrene: Secondary | ICD-10-CM | POA: Diagnosis not present

## 2019-05-03 DIAGNOSIS — M79672 Pain in left foot: Secondary | ICD-10-CM | POA: Diagnosis not present

## 2019-05-03 DIAGNOSIS — E1140 Type 2 diabetes mellitus with diabetic neuropathy, unspecified: Secondary | ICD-10-CM | POA: Diagnosis not present

## 2019-05-03 DIAGNOSIS — M79671 Pain in right foot: Secondary | ICD-10-CM | POA: Diagnosis not present

## 2019-05-03 DIAGNOSIS — L110 Acquired keratosis follicularis: Secondary | ICD-10-CM | POA: Diagnosis not present

## 2019-06-21 DIAGNOSIS — E7800 Pure hypercholesterolemia, unspecified: Secondary | ICD-10-CM | POA: Diagnosis not present

## 2019-06-21 DIAGNOSIS — E1165 Type 2 diabetes mellitus with hyperglycemia: Secondary | ICD-10-CM | POA: Diagnosis not present

## 2019-06-22 DIAGNOSIS — E1151 Type 2 diabetes mellitus with diabetic peripheral angiopathy without gangrene: Secondary | ICD-10-CM | POA: Diagnosis not present

## 2019-06-22 DIAGNOSIS — Z89512 Acquired absence of left leg below knee: Secondary | ICD-10-CM | POA: Diagnosis not present

## 2019-06-22 DIAGNOSIS — I739 Peripheral vascular disease, unspecified: Secondary | ICD-10-CM | POA: Diagnosis not present

## 2019-06-22 DIAGNOSIS — Z299 Encounter for prophylactic measures, unspecified: Secondary | ICD-10-CM | POA: Diagnosis not present

## 2019-06-22 DIAGNOSIS — E1165 Type 2 diabetes mellitus with hyperglycemia: Secondary | ICD-10-CM | POA: Diagnosis not present

## 2019-08-17 DIAGNOSIS — M79671 Pain in right foot: Secondary | ICD-10-CM | POA: Diagnosis not present

## 2019-08-17 DIAGNOSIS — E1151 Type 2 diabetes mellitus with diabetic peripheral angiopathy without gangrene: Secondary | ICD-10-CM | POA: Diagnosis not present

## 2019-08-17 DIAGNOSIS — E1140 Type 2 diabetes mellitus with diabetic neuropathy, unspecified: Secondary | ICD-10-CM | POA: Diagnosis not present

## 2019-08-17 DIAGNOSIS — L110 Acquired keratosis follicularis: Secondary | ICD-10-CM | POA: Diagnosis not present

## 2019-08-17 DIAGNOSIS — M79672 Pain in left foot: Secondary | ICD-10-CM | POA: Diagnosis not present

## 2019-08-20 DIAGNOSIS — K219 Gastro-esophageal reflux disease without esophagitis: Secondary | ICD-10-CM | POA: Diagnosis not present

## 2019-08-20 DIAGNOSIS — I739 Peripheral vascular disease, unspecified: Secondary | ICD-10-CM | POA: Diagnosis not present

## 2019-08-20 DIAGNOSIS — E785 Hyperlipidemia, unspecified: Secondary | ICD-10-CM | POA: Diagnosis not present

## 2019-08-20 DIAGNOSIS — E1165 Type 2 diabetes mellitus with hyperglycemia: Secondary | ICD-10-CM | POA: Diagnosis not present

## 2019-09-28 DIAGNOSIS — I739 Peripheral vascular disease, unspecified: Secondary | ICD-10-CM | POA: Diagnosis not present

## 2019-09-28 DIAGNOSIS — I10 Essential (primary) hypertension: Secondary | ICD-10-CM | POA: Diagnosis not present

## 2019-09-28 DIAGNOSIS — Z299 Encounter for prophylactic measures, unspecified: Secondary | ICD-10-CM | POA: Diagnosis not present

## 2019-09-28 DIAGNOSIS — E1151 Type 2 diabetes mellitus with diabetic peripheral angiopathy without gangrene: Secondary | ICD-10-CM | POA: Diagnosis not present

## 2019-09-28 DIAGNOSIS — E1165 Type 2 diabetes mellitus with hyperglycemia: Secondary | ICD-10-CM | POA: Diagnosis not present

## 2019-10-21 DIAGNOSIS — E785 Hyperlipidemia, unspecified: Secondary | ICD-10-CM | POA: Diagnosis not present

## 2019-10-21 DIAGNOSIS — I739 Peripheral vascular disease, unspecified: Secondary | ICD-10-CM | POA: Diagnosis not present

## 2019-10-21 DIAGNOSIS — K219 Gastro-esophageal reflux disease without esophagitis: Secondary | ICD-10-CM | POA: Diagnosis not present

## 2019-10-21 DIAGNOSIS — E1165 Type 2 diabetes mellitus with hyperglycemia: Secondary | ICD-10-CM | POA: Diagnosis not present

## 2019-11-09 DIAGNOSIS — M79671 Pain in right foot: Secondary | ICD-10-CM | POA: Diagnosis not present

## 2019-11-09 DIAGNOSIS — L110 Acquired keratosis follicularis: Secondary | ICD-10-CM | POA: Diagnosis not present

## 2019-11-09 DIAGNOSIS — M79672 Pain in left foot: Secondary | ICD-10-CM | POA: Diagnosis not present

## 2019-11-09 DIAGNOSIS — E1151 Type 2 diabetes mellitus with diabetic peripheral angiopathy without gangrene: Secondary | ICD-10-CM | POA: Diagnosis not present

## 2019-11-09 DIAGNOSIS — E1140 Type 2 diabetes mellitus with diabetic neuropathy, unspecified: Secondary | ICD-10-CM | POA: Diagnosis not present

## 2019-12-21 DIAGNOSIS — I739 Peripheral vascular disease, unspecified: Secondary | ICD-10-CM | POA: Diagnosis not present

## 2019-12-21 DIAGNOSIS — E785 Hyperlipidemia, unspecified: Secondary | ICD-10-CM | POA: Diagnosis not present

## 2019-12-21 DIAGNOSIS — K219 Gastro-esophageal reflux disease without esophagitis: Secondary | ICD-10-CM | POA: Diagnosis not present

## 2019-12-21 DIAGNOSIS — E1165 Type 2 diabetes mellitus with hyperglycemia: Secondary | ICD-10-CM | POA: Diagnosis not present

## 2019-12-30 DIAGNOSIS — Z2821 Immunization not carried out because of patient refusal: Secondary | ICD-10-CM | POA: Diagnosis not present

## 2019-12-30 DIAGNOSIS — E1165 Type 2 diabetes mellitus with hyperglycemia: Secondary | ICD-10-CM | POA: Diagnosis not present

## 2019-12-30 DIAGNOSIS — Z89512 Acquired absence of left leg below knee: Secondary | ICD-10-CM | POA: Diagnosis not present

## 2019-12-30 DIAGNOSIS — I10 Essential (primary) hypertension: Secondary | ICD-10-CM | POA: Diagnosis not present

## 2019-12-30 DIAGNOSIS — E1151 Type 2 diabetes mellitus with diabetic peripheral angiopathy without gangrene: Secondary | ICD-10-CM | POA: Diagnosis not present

## 2019-12-30 DIAGNOSIS — Z299 Encounter for prophylactic measures, unspecified: Secondary | ICD-10-CM | POA: Diagnosis not present

## 2020-02-21 DIAGNOSIS — I739 Peripheral vascular disease, unspecified: Secondary | ICD-10-CM | POA: Diagnosis not present

## 2020-02-21 DIAGNOSIS — E785 Hyperlipidemia, unspecified: Secondary | ICD-10-CM | POA: Diagnosis not present

## 2020-02-21 DIAGNOSIS — E1165 Type 2 diabetes mellitus with hyperglycemia: Secondary | ICD-10-CM | POA: Diagnosis not present

## 2020-02-21 DIAGNOSIS — K219 Gastro-esophageal reflux disease without esophagitis: Secondary | ICD-10-CM | POA: Diagnosis not present

## 2020-02-22 DIAGNOSIS — L110 Acquired keratosis follicularis: Secondary | ICD-10-CM | POA: Diagnosis not present

## 2020-02-22 DIAGNOSIS — E1151 Type 2 diabetes mellitus with diabetic peripheral angiopathy without gangrene: Secondary | ICD-10-CM | POA: Diagnosis not present

## 2020-02-22 DIAGNOSIS — M79671 Pain in right foot: Secondary | ICD-10-CM | POA: Diagnosis not present

## 2020-02-22 DIAGNOSIS — E1140 Type 2 diabetes mellitus with diabetic neuropathy, unspecified: Secondary | ICD-10-CM | POA: Diagnosis not present

## 2020-02-22 DIAGNOSIS — M79672 Pain in left foot: Secondary | ICD-10-CM | POA: Diagnosis not present

## 2020-03-02 DIAGNOSIS — E119 Type 2 diabetes mellitus without complications: Secondary | ICD-10-CM | POA: Diagnosis not present

## 2020-03-02 DIAGNOSIS — Z961 Presence of intraocular lens: Secondary | ICD-10-CM | POA: Diagnosis not present

## 2020-03-02 DIAGNOSIS — Z794 Long term (current) use of insulin: Secondary | ICD-10-CM | POA: Diagnosis not present

## 2020-03-02 DIAGNOSIS — Z7984 Long term (current) use of oral hypoglycemic drugs: Secondary | ICD-10-CM | POA: Diagnosis not present

## 2020-03-02 DIAGNOSIS — H524 Presbyopia: Secondary | ICD-10-CM | POA: Diagnosis not present

## 2020-03-02 DIAGNOSIS — H26493 Other secondary cataract, bilateral: Secondary | ICD-10-CM | POA: Diagnosis not present

## 2020-03-07 DIAGNOSIS — Z299 Encounter for prophylactic measures, unspecified: Secondary | ICD-10-CM | POA: Diagnosis not present

## 2020-03-07 DIAGNOSIS — Z7189 Other specified counseling: Secondary | ICD-10-CM | POA: Diagnosis not present

## 2020-03-07 DIAGNOSIS — Z6822 Body mass index (BMI) 22.0-22.9, adult: Secondary | ICD-10-CM | POA: Diagnosis not present

## 2020-03-07 DIAGNOSIS — R5383 Other fatigue: Secondary | ICD-10-CM | POA: Diagnosis not present

## 2020-03-07 DIAGNOSIS — Z79899 Other long term (current) drug therapy: Secondary | ICD-10-CM | POA: Diagnosis not present

## 2020-03-07 DIAGNOSIS — Z89512 Acquired absence of left leg below knee: Secondary | ICD-10-CM | POA: Diagnosis not present

## 2020-03-07 DIAGNOSIS — Z1339 Encounter for screening examination for other mental health and behavioral disorders: Secondary | ICD-10-CM | POA: Diagnosis not present

## 2020-03-07 DIAGNOSIS — Z125 Encounter for screening for malignant neoplasm of prostate: Secondary | ICD-10-CM | POA: Diagnosis not present

## 2020-03-07 DIAGNOSIS — E7800 Pure hypercholesterolemia, unspecified: Secondary | ICD-10-CM | POA: Diagnosis not present

## 2020-03-07 DIAGNOSIS — E1165 Type 2 diabetes mellitus with hyperglycemia: Secondary | ICD-10-CM | POA: Diagnosis not present

## 2020-03-07 DIAGNOSIS — Z0000 Encounter for general adult medical examination without abnormal findings: Secondary | ICD-10-CM | POA: Diagnosis not present

## 2020-03-07 DIAGNOSIS — I10 Essential (primary) hypertension: Secondary | ICD-10-CM | POA: Diagnosis not present

## 2020-03-07 DIAGNOSIS — Z1331 Encounter for screening for depression: Secondary | ICD-10-CM | POA: Diagnosis not present

## 2020-03-20 DIAGNOSIS — E785 Hyperlipidemia, unspecified: Secondary | ICD-10-CM | POA: Diagnosis not present

## 2020-03-20 DIAGNOSIS — E1165 Type 2 diabetes mellitus with hyperglycemia: Secondary | ICD-10-CM | POA: Diagnosis not present

## 2020-03-20 DIAGNOSIS — I739 Peripheral vascular disease, unspecified: Secondary | ICD-10-CM | POA: Diagnosis not present

## 2020-03-31 DIAGNOSIS — E1151 Type 2 diabetes mellitus with diabetic peripheral angiopathy without gangrene: Secondary | ICD-10-CM | POA: Diagnosis not present

## 2020-03-31 DIAGNOSIS — E1165 Type 2 diabetes mellitus with hyperglycemia: Secondary | ICD-10-CM | POA: Diagnosis not present

## 2020-03-31 DIAGNOSIS — I10 Essential (primary) hypertension: Secondary | ICD-10-CM | POA: Diagnosis not present

## 2020-03-31 DIAGNOSIS — Z789 Other specified health status: Secondary | ICD-10-CM | POA: Diagnosis not present

## 2020-03-31 DIAGNOSIS — M545 Low back pain, unspecified: Secondary | ICD-10-CM | POA: Diagnosis not present

## 2020-03-31 DIAGNOSIS — Z299 Encounter for prophylactic measures, unspecified: Secondary | ICD-10-CM | POA: Diagnosis not present

## 2020-03-31 DIAGNOSIS — M549 Dorsalgia, unspecified: Secondary | ICD-10-CM | POA: Diagnosis not present

## 2020-04-03 ENCOUNTER — Other Ambulatory Visit (HOSPITAL_COMMUNITY): Payer: Self-pay | Admitting: Psychiatry

## 2020-04-03 NOTE — Telephone Encounter (Signed)
Call for appt

## 2020-04-05 ENCOUNTER — Telehealth (INDEPENDENT_AMBULATORY_CARE_PROVIDER_SITE_OTHER): Payer: PPO | Admitting: Psychiatry

## 2020-04-05 ENCOUNTER — Other Ambulatory Visit: Payer: Self-pay

## 2020-04-05 ENCOUNTER — Encounter (HOSPITAL_COMMUNITY): Payer: Self-pay | Admitting: Psychiatry

## 2020-04-05 DIAGNOSIS — F322 Major depressive disorder, single episode, severe without psychotic features: Secondary | ICD-10-CM

## 2020-04-05 DIAGNOSIS — F09 Unspecified mental disorder due to known physiological condition: Secondary | ICD-10-CM | POA: Diagnosis not present

## 2020-04-05 MED ORDER — ARIPIPRAZOLE 5 MG PO TABS: 5.0000 mg | ORAL_TABLET | Freq: Every day | ORAL | 2 refills | Status: DC

## 2020-04-05 MED ORDER — FLUOXETINE HCL 20 MG PO CAPS: 20.0000 mg | ORAL_CAPSULE | Freq: Every morning | ORAL | 2 refills | Status: DC

## 2020-04-05 MED ORDER — BENZTROPINE MESYLATE 0.5 MG PO TABS: 0.5000 mg | ORAL_TABLET | Freq: Every day | ORAL | 2 refills | Status: DC

## 2020-04-05 NOTE — Progress Notes (Signed)
Virtual Visit via Telephone Note  I connected with Chad Grant on 04/05/20 at  1:00 PM EDT by telephone and verified that I am speaking with the correct person using two identifiers.  Location: Patient: home Provider: home   I discussed the limitations, risks, security and privacy concerns of performing an evaluation and management service by telephone and the availability of in person appointments. I also discussed with the patient that there may be a patient responsible charge related to this service. The patient expressed understanding and agreed to proceed.    I discussed the assessment and treatment plan with the patient. The patient was provided an opportunity to ask questions and all were answered. The patient agreed with the plan and demonstrated an understanding of the instructions.   The patient was advised to call back or seek an in-person evaluation if the symptoms worsen or if the condition fails to improve as anticipated.  I provided 15 minutes of non-face-to-face time during this encounter.   Diannia Rudereborah Ross, MD  [facility] OP Progress Note  04/05/2020 1:16 PM Chad Grant  MRN:  [id number]  Chief Complaint:  Chief Complaint    Anxiety; Depression; Agitation; Follow-up     WUJ:WJXBHPI:This patient is a 70 year-old married black male who lives [place] in [place]. He has 2 children who are grown. 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  Apparently when the patient was in better health and used to work he was a happy go lucky person according to his wife. Since she's had the amputation he has gone downhill. In fact he was hospitalized in June for severe depression. He's been on numerous medications in the past. He used to be angry and aggressive but now seems blunted and shut down. He has no interest in life. He sleeps all day or watches TV. He refuses to bathe which is a big problem for his wife.  He is very defensive and gets angry when I questioned his wife about his status, almost to the point of paranoia. He claims he can't sleep but his wife reports that he sleeps through the day and not at night. He denies any pain   The patient and wife return after long absence.  He was last seen about 15 months ago.  She states however that he has remained stable.  She gets up every day and makes him walk with his walker.  His ambulation is okay but not great.  He seems to be in good spirits and denies being depressed or suicidal.  He denies anxiety.  He is generally sleeping well.  He still is having significant back pain and recently had an x-ray.  It looks like most of this is secondary to arthritis.  He still has some twitching in his legs which seems to be involuntary according to his wife.  She had stopped the Cogentin but will agrees to retry it to see if it might help however if it does not we may need to try other agents for tardive dyskinesia.  He seems to be stable on the Prozac and Abilify in terms of mood   Visit Diagnosis:    ICD-10-CM   1. Severe single current episode of major depressive disorder, without psychotic features (HCC)  F32.2   2. Cognitive disorder  F09     Past Psychiatric History: The patient has been seen in  our office since 2009 he has been on numerous mood stabilizers and antidepressants  Past Medical History:  Past Medical History:  Diagnosis Date  . Dementia (HCC)   . Depression   . Diabetes mellitus type II   . Hypercholesteremia   . Hypertension   . S/P BKA (below knee amputation) unilateral (HCC)   . Stroke [facility]) 2013   left sided weakness  . TIA (transient ischemic attack)     Past Surgical History:  Procedure Laterality Date  . ABDOMINAL AORTAGRAM N/A 04/29/2012   Procedure: ABDOMINAL Ronny Flurry;  Surgeon: Nada Libman, MD;  Location: [facility] CATH LAB;  Service: Cardiovascular;  Laterality: N/A;  . AMPUTATION Right 05/15/2018   Procedure: AMPUTATION  RIGHT THIRD TOE;  Surgeon: Ferman Hamming, DPM;  Location: [facility] ORS;  Service: Podiatry;  Laterality: Right;  . CATARACT EXTRACTION W/PHACO Left 12/04/2015   Procedure: CATARACT EXTRACTION PHACO AND INTRAOCULAR LENS PLACEMENT LEFT EYE CDE=19.92;  Surgeon: Gemma Payor, MD;  Location: [facility] ORS;  Service: Ophthalmology;  Laterality: Left;  left  . cataract removed Bilateral   . left leg amputa     BKA  . left leg amputated    . SHOULDER SURGERY Right    rotator cuff    Family Psychiatric History: see below  Family History:  Family History  Problem Relation Age of Onset  . Heart disease Mother   . Diabetes Father   . Alcohol abuse Brother   . Diabetes Brother   . Diabetes Sister   . Peripheral vascular disease Sister        amputation  . Diabetes Daughter   . Anxiety disorder Neg Hx   . Bipolar disorder Neg Hx   . Dementia Neg Hx   . Depression Neg Hx   . Drug abuse Neg Hx   . Paranoid behavior Neg Hx   . Schizophrenia Neg Hx   . OCD Neg Hx   . Seizures Neg Hx   . Sexual abuse Neg Hx   . Physical abuse Neg Hx   . ADD / ADHD Neg Hx   . Colon cancer Neg Hx   . Colon polyps Neg Hx     Social History:  Social History   Socioeconomic History  . Marital status: Married    Spouse name: Not on file  . Number of children: Not on file  . Years of education: Not on file  . Highest education level: Not on file  Occupational History  . Not on file  Tobacco Use  . Smoking status: Never Smoker  . Smokeless tobacco: Never Used  Vaping Use  . Vaping Use: Never used  Substance and Sexual Activity  . Alcohol use: No    Comment: in the past but not since amputation in 2009, drank alcohol on weekends   . Drug use: No  . Sexual activity: Not Currently  Other Topics Concern  . Not on file  Social History Narrative  . Not on file   Social Determinants of Health   Financial Resource Strain: Not on file  Food Insecurity: Not on file  Transportation Needs: Not on file  Physical  Activity: Not on file  Stress: Not on file  Social Connections: Not on file    Allergies: No Known Allergies  Metabolic Disorder Labs: Lab Results  Component Value Date   HGBA1C 7.2 (H) 05/13/2018   MPG 159.94 05/13/2018   MPG 147 08/14/2007   No results found for: PROLACTIN No results found for: CHOL, TRIG, HDL,  CHOLHDL, VLDL, LDLCALC No results found for: TSH  Therapeutic Level Labs: No results found for: LITHIUM No results found for: VALPROATE No components found for:  CBMZ  Current Medications: Current Outpatient Medications  Medication Sig Dispense Refill  . ARIPiprazole (ABILIFY) 5 MG tablet Take 1 tablet (5 mg total) by mouth at bedtime. 90 tablet 2  . benztropine (COGENTIN) 0.5 MG tablet Take 1 tablet (0.5 mg total) by mouth at bedtime. 90 tablet 2  . clopidogrel (PLAVIX) 75 MG tablet Take 1 tablet (75 mg total) by mouth daily.    Marland Kitchen FLUoxetine (PROZAC) 20 MG capsule Take 1 capsule (20 mg total) by mouth every morning. 90 capsule 2  . glipiZIDE (GLUCOTROL) 5 MG tablet Take 5 mg by mouth daily.    . lipase/protease/amylase (CREON) 36000 UNITS CPEP capsule Take 2 capsules (72,000 Units total) by mouth 3 (three) times daily with meals. 1 capsule with snacks 240 capsule 3  . loperamide (IMODIUM) 2 MG capsule Take by mouth as needed for diarrhea or loose stools.    . Melatonin 5 MG CAPS Take 5 mg by mouth at bedtime as needed (sleep).    . metoprolol tartrate (LOPRESSOR) 25 MG tablet Take 1 tablet (25 mg total) by mouth 2 (two) times daily. (Patient taking differently: Take 25 mg by mouth daily. )    . polyethylene glycol-electrolytes (TRILYTE) 420 g solution Take 4,000 mLs by mouth as directed. 4000 mL 0  . simvastatin (ZOCOR) 40 MG tablet Take 40 mg by mouth every evening.     No current facility-administered medications for this visit.     Musculoskeletal: Strength & Muscle Tone: decreased Gait & Station: unsteady Patient leans: N/A  Psychiatric Specialty  Exam: Review of Systems  Musculoskeletal: Positive for arthralgias, back pain and gait problem.  Psychiatric/Behavioral: Positive for decreased concentration.  All other systems reviewed and are negative.   There were no vitals taken for this visit.There is no height or weight on file to calculate BMI.  General Appearance: NA  Eye Contact:  NA  Speech:  Clear and Coherent  Volume:  Normal  Mood:  Anxious  Affect:  NA  Thought Process:  Coherent  Orientation:  Full (Time, Place, and Person)  Thought Content: Rumination   Suicidal Thoughts:  No  Homicidal Thoughts:  No  Memory:  Immediate;   Fair Recent;   Poor Remote;   Poor  Judgement:  Impaired  Insight:  Lacking  Psychomotor Activity:  Decreased  Concentration:  Concentration: Fair and Attention Span: Fair  Recall:  Poor  Fund of Knowledge: Fair  Language: Good  Akathisia:  No  Handed:  Right  AIMS (if indicated): reports twitching in LEs  Assets:  Communication Skills Resilience Social Support  ADL's:  Intact  Cognition: Impaired,  Mild  Sleep:  Good   Screenings: AUDIT   Flowsheet Row [facility]  Alcohol Use Disorder Identification Test Final Score (AUDIT) 0    PHQ2-9   Flowsheet Row [facility]  PHQ-2 Total Score 0    Flowsheet Row Video Visit from 04/05/2020 [facility]  C-SSRS RISK CATEGORY No Risk       Assessment and Plan: This patient is a 70 year old male with a history of depression and moderate dementia.  He is doing well with his wife supervision.  He will continue Prozac 20 mg daily for depression and Abilify  every night for agitation.  He has stopped Aricept due to stomach upset.  He will restart Cogentin 0.5 mg daily to diminish side effects from Abilify.  He will return to see me in 6 months   Diannia Ruder, MD 04/05/2020, 1:16 PM

## 2020-04-06 ENCOUNTER — Encounter (HOSPITAL_COMMUNITY): Payer: Self-pay

## 2020-05-20 DIAGNOSIS — I10 Essential (primary) hypertension: Secondary | ICD-10-CM | POA: Diagnosis not present

## 2020-05-20 DIAGNOSIS — E1165 Type 2 diabetes mellitus with hyperglycemia: Secondary | ICD-10-CM | POA: Diagnosis not present

## 2020-05-20 DIAGNOSIS — K219 Gastro-esophageal reflux disease without esophagitis: Secondary | ICD-10-CM | POA: Diagnosis not present

## 2020-06-13 IMAGING — DX RIGHT FOOT COMPLETE - 3+ VIEW
3 series · 3 of 3 positions shown · non-contrast
Comparison: MRI right foot 01/02/2015.  Plain film 04/06/2012.

CLINICAL DATA: Preoperative examination. Pain about the distal
aspect of the right foot.

EXAM:
RIGHT FOOT COMPLETE - 3+ VIEW

[foot ap]
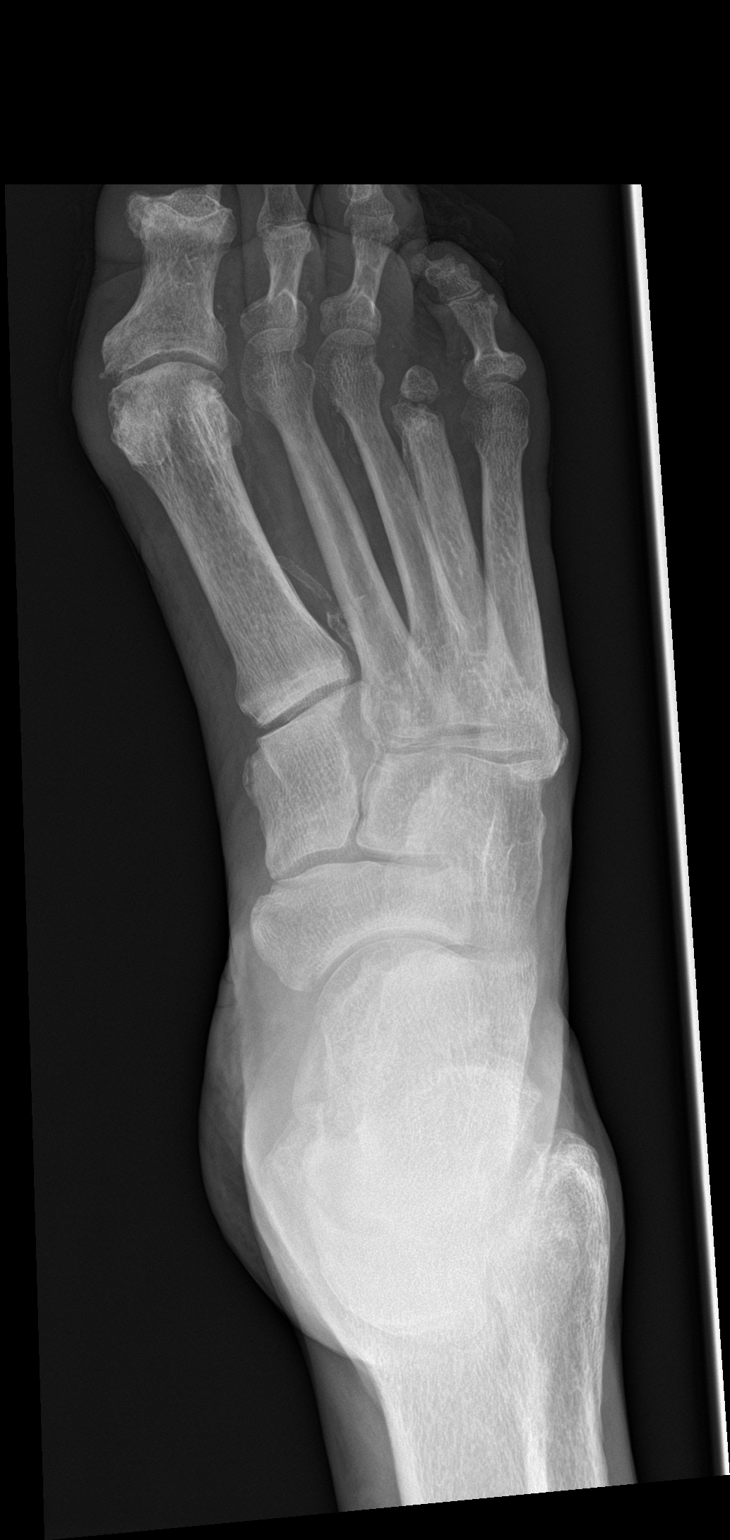

[foot obl]
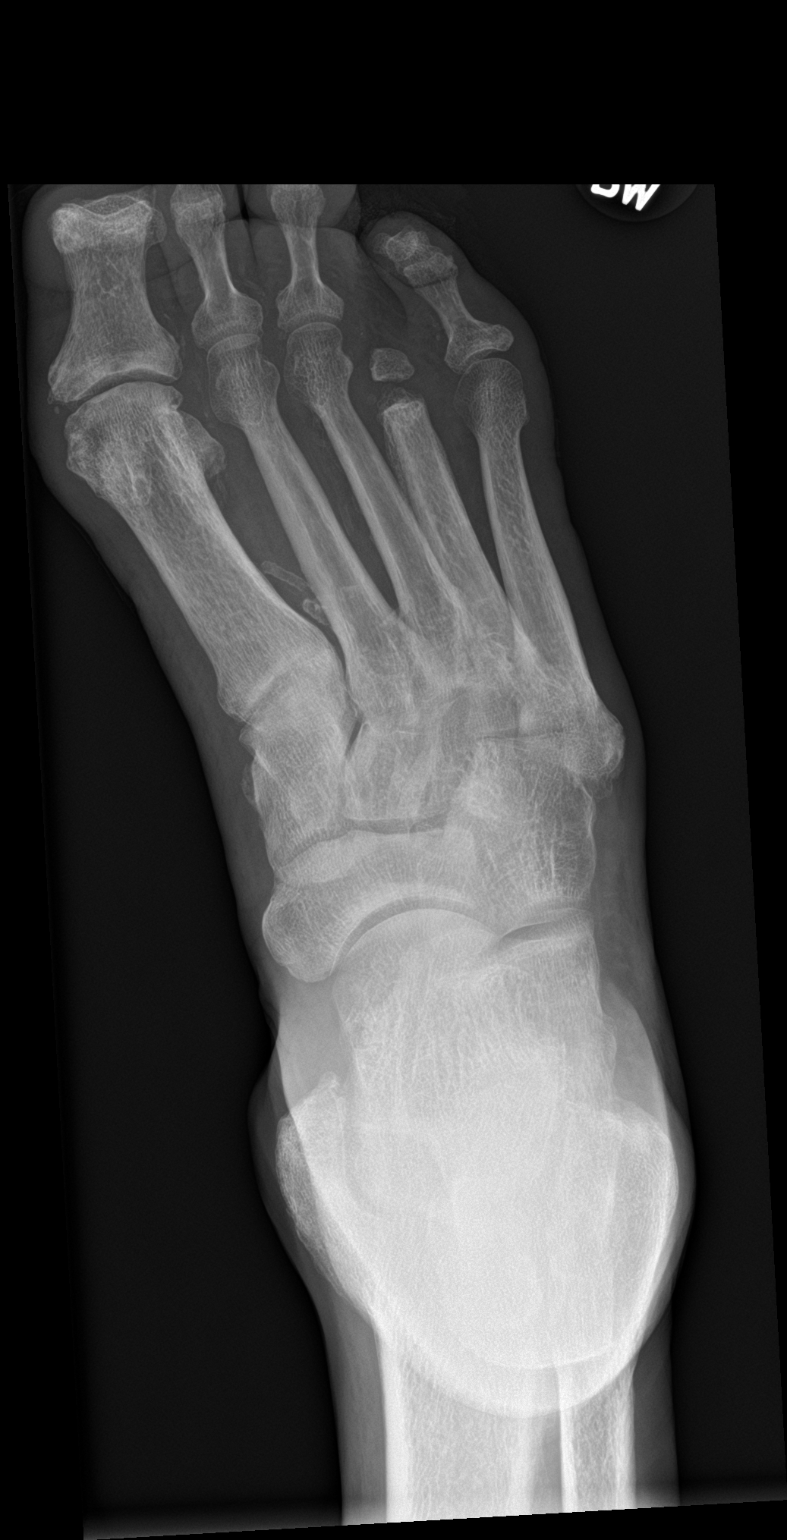

[foot lat]
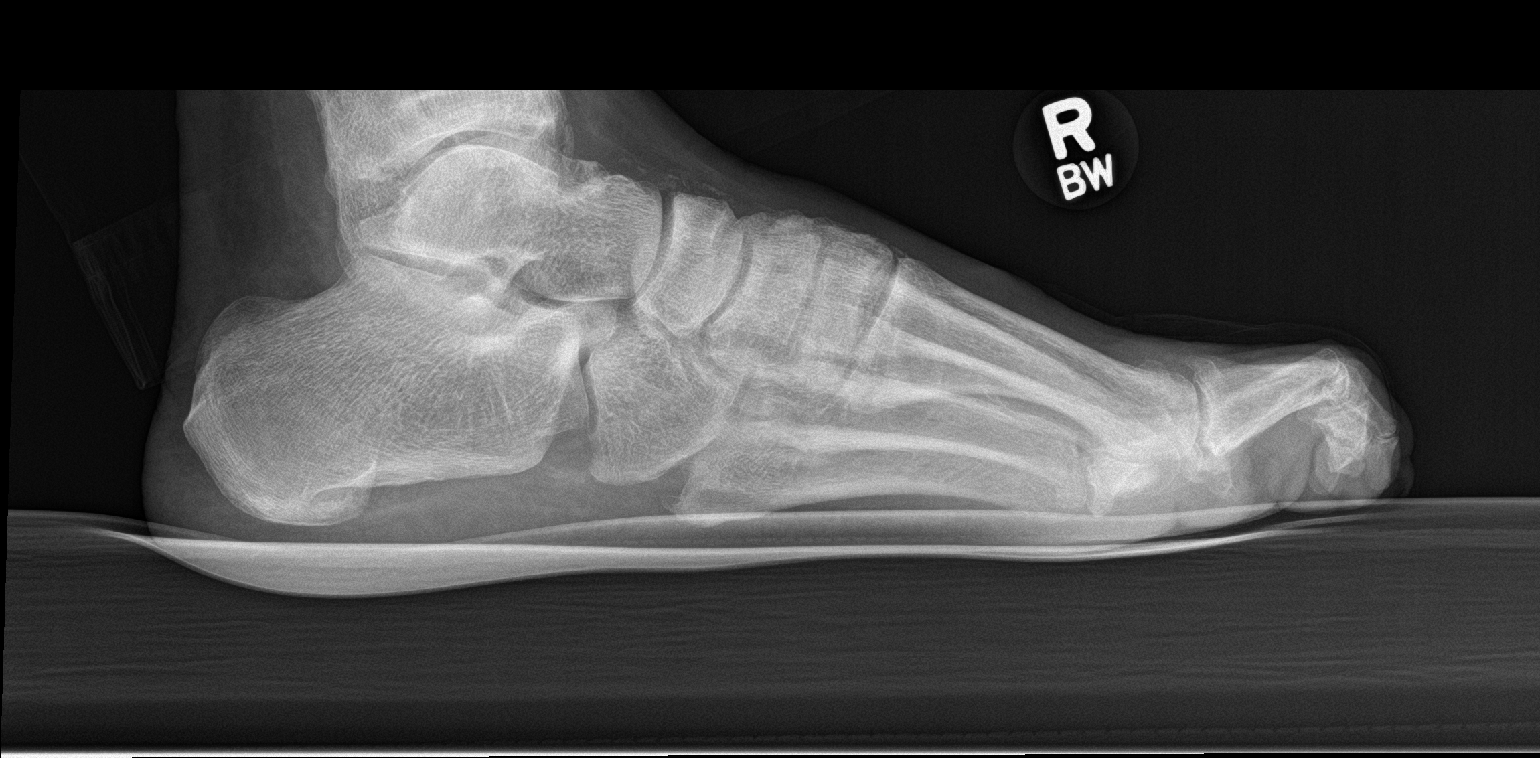

[3 of 3 positions shown; findings below may reference images not displayed]

FINDINGS: Since the prior examination, the patient has undergone amputation of
the fourth toe. The patient also appears to be status post
amputation of the tuft of the great toe. No acute bony or joint
abnormality is seen. Remote fracture of the neck of the fourth
metatarsal is noted. Extensive atherosclerosis is noted. The patient
has hammertoe deformities throughout.
IMPRESSION: No acute abnormality.

Status post amputation of the fourth toe and tuft of the distal
phalanx of the great toe.

Atherosclerosis.

Hammertoe deformities.

## 2020-07-03 ENCOUNTER — Other Ambulatory Visit (HOSPITAL_COMMUNITY): Payer: Self-pay | Admitting: Psychiatry

## 2020-07-07 DIAGNOSIS — Z299 Encounter for prophylactic measures, unspecified: Secondary | ICD-10-CM | POA: Diagnosis not present

## 2020-07-07 DIAGNOSIS — D6869 Other thrombophilia: Secondary | ICD-10-CM | POA: Diagnosis not present

## 2020-07-07 DIAGNOSIS — S98131A Complete traumatic amputation of one right lesser toe, initial encounter: Secondary | ICD-10-CM | POA: Diagnosis not present

## 2020-07-07 DIAGNOSIS — Z6822 Body mass index (BMI) 22.0-22.9, adult: Secondary | ICD-10-CM | POA: Diagnosis not present

## 2020-07-07 DIAGNOSIS — E1165 Type 2 diabetes mellitus with hyperglycemia: Secondary | ICD-10-CM | POA: Diagnosis not present

## 2020-07-07 DIAGNOSIS — I69354 Hemiplegia and hemiparesis following cerebral infarction affecting left non-dominant side: Secondary | ICD-10-CM | POA: Diagnosis not present

## 2020-07-28 DIAGNOSIS — R195 Other fecal abnormalities: Secondary | ICD-10-CM | POA: Diagnosis not present

## 2020-07-28 DIAGNOSIS — Z299 Encounter for prophylactic measures, unspecified: Secondary | ICD-10-CM | POA: Diagnosis not present

## 2020-07-28 DIAGNOSIS — I10 Essential (primary) hypertension: Secondary | ICD-10-CM | POA: Diagnosis not present

## 2020-07-28 DIAGNOSIS — K921 Melena: Secondary | ICD-10-CM | POA: Diagnosis not present

## 2020-07-28 DIAGNOSIS — G309 Alzheimer's disease, unspecified: Secondary | ICD-10-CM | POA: Diagnosis not present

## 2020-07-28 DIAGNOSIS — E1165 Type 2 diabetes mellitus with hyperglycemia: Secondary | ICD-10-CM | POA: Diagnosis not present

## 2020-08-02 ENCOUNTER — Encounter: Payer: Self-pay | Admitting: Internal Medicine

## 2020-08-30 DIAGNOSIS — E1151 Type 2 diabetes mellitus with diabetic peripheral angiopathy without gangrene: Secondary | ICD-10-CM | POA: Diagnosis not present

## 2020-08-30 DIAGNOSIS — E1140 Type 2 diabetes mellitus with diabetic neuropathy, unspecified: Secondary | ICD-10-CM | POA: Diagnosis not present

## 2020-08-30 DIAGNOSIS — L110 Acquired keratosis follicularis: Secondary | ICD-10-CM | POA: Diagnosis not present

## 2020-08-30 DIAGNOSIS — M79672 Pain in left foot: Secondary | ICD-10-CM | POA: Diagnosis not present

## 2020-08-30 DIAGNOSIS — M79671 Pain in right foot: Secondary | ICD-10-CM | POA: Diagnosis not present

## 2020-09-20 DIAGNOSIS — I10 Essential (primary) hypertension: Secondary | ICD-10-CM | POA: Diagnosis not present

## 2020-09-20 DIAGNOSIS — E1165 Type 2 diabetes mellitus with hyperglycemia: Secondary | ICD-10-CM | POA: Diagnosis not present

## 2020-09-20 DIAGNOSIS — K219 Gastro-esophageal reflux disease without esophagitis: Secondary | ICD-10-CM | POA: Diagnosis not present

## 2020-10-20 DIAGNOSIS — E7800 Pure hypercholesterolemia, unspecified: Secondary | ICD-10-CM | POA: Diagnosis not present

## 2020-11-02 DIAGNOSIS — I10 Essential (primary) hypertension: Secondary | ICD-10-CM | POA: Diagnosis not present

## 2020-11-02 DIAGNOSIS — Z299 Encounter for prophylactic measures, unspecified: Secondary | ICD-10-CM | POA: Diagnosis not present

## 2020-11-02 DIAGNOSIS — M545 Low back pain, unspecified: Secondary | ICD-10-CM | POA: Diagnosis not present

## 2020-11-02 DIAGNOSIS — Z23 Encounter for immunization: Secondary | ICD-10-CM | POA: Diagnosis not present

## 2020-11-02 DIAGNOSIS — G309 Alzheimer's disease, unspecified: Secondary | ICD-10-CM | POA: Diagnosis not present

## 2020-11-02 DIAGNOSIS — E1165 Type 2 diabetes mellitus with hyperglycemia: Secondary | ICD-10-CM | POA: Diagnosis not present

## 2020-11-02 DIAGNOSIS — F0280 Dementia in other diseases classified elsewhere without behavioral disturbance: Secondary | ICD-10-CM | POA: Diagnosis not present

## 2020-11-20 DIAGNOSIS — E7800 Pure hypercholesterolemia, unspecified: Secondary | ICD-10-CM | POA: Diagnosis not present

## 2020-12-04 NOTE — Progress Notes (Deleted)
Referring Provider: Kirstie Peri, MD Primary Care Physician:  Kirstie Peri, MD Primary GI Physician: Dr. Jena Gauss  No chief complaint on file.   HPI:   Chad Grant is a 70 y.o. male presenting at the request of Dr. Sherryll Burger due to stool test positive for blood ***.   Last seen in our office in November 2020 at the request of Dr. Sherryll Burger for diarrhea.  This was the first time he was seen in our office.  His wife provided the history due to history of dementia.  Reported several months of intermittent diarrhea, sometimes with leaking stool, fecal smearing.  Wears depends.  On looser days, has to change 2-3 times per day.  Fried/greasy foods worsen symptoms.  Denied rectal bleeding.  Had been gradually losing weight for the last year.  No prior colonoscopy.  Plan to retrieve prior stool studies to determine if patient had history of C. difficile as they reported a prior course of Flagyl.  Also plan for colonoscopy in the future, and empirically start Creon due to longstanding history of diabetes.  Patient's wife called back in December 2020 reporting Creon was helping and a new prescription was sent. Patient's wife also called to cancel his colonoscopy.  Today:   Past Medical History:  Diagnosis Date   Dementia (HCC)    Depression    Diabetes mellitus type II    Hypercholesteremia    Hypertension    S/P BKA (below knee amputation) unilateral (HCC)    Stroke (HCC) 2013   left sided weakness   TIA (transient ischemic attack)     Past Surgical History:  Procedure Laterality Date   ABDOMINAL AORTAGRAM N/A 04/29/2012   Procedure: ABDOMINAL Ronny Flurry;  Surgeon: Nada Libman, MD;  Location: [facility] CATH LAB;  Service: Cardiovascular;  Laterality: N/A;   AMPUTATION Right 05/15/2018   Procedure: AMPUTATION RIGHT THIRD TOE;  Surgeon: Ferman Hamming, DPM;  Location: [facility];  Service: Podiatry;  Laterality: Right;   CATARACT EXTRACTION W/PHACO Left 12/04/2015   Procedure: CATARACT EXTRACTION  PHACO AND INTRAOCULAR LENS PLACEMENT LEFT EYE CDE=19.92;  Surgeon: Gemma Payor, MD;  Location: [facility];  Service: Ophthalmology;  Laterality: Left;  left   cataract removed Bilateral    left leg amputa     BKA   left leg amputated     SHOULDER SURGERY Right    rotator cuff    Current Outpatient Medications  Medication Sig Dispense Refill   ARIPiprazole (ABILIFY) 5 MG tablet Take 1 tablet (5 mg total) by mouth at bedtime. 90 tablet 2   benztropine (COGENTIN) 0.5 MG tablet Take 1 tablet (0.5 mg total) by mouth at bedtime. 90 tablet 2   clopidogrel (PLAVIX) 75 MG tablet Take 1 tablet (75 mg total) by mouth daily.     FLUoxetine (PROZAC) 20 MG capsule Take 1 capsule by mouth once daily in the morning 90 capsule 2   glipiZIDE (GLUCOTROL) 5 MG tablet Take 5 mg by mouth daily.     lipase/protease/amylase (CREON) 36000 UNITS CPEP capsule Take 2 capsules (72,000 Units total) by mouth 3 (three) times daily with meals. 1 capsule with snacks 240 capsule 3   loperamide (IMODIUM) 2 MG capsule Take by mouth as needed for diarrhea or loose stools.     Melatonin 5 MG CAPS Take 5 mg by mouth at bedtime as needed (sleep).     metoprolol tartrate (LOPRESSOR) 25 MG tablet Take 1 tablet (25 mg total) by mouth 2 (two) times daily. (Patient  taking differently: Take 25 mg by mouth daily. )     polyethylene glycol-electrolytes (TRILYTE) 420 g solution Take 4,000 mLs by mouth as directed. 4000 mL 0   simvastatin (ZOCOR) 40 MG tablet Take 40 mg by mouth every evening.     No current facility-administered medications for this visit.    Allergies as of 12/06/2020   (No Known Allergies)    Family History  Problem Relation Age of Onset   Heart disease Mother    Diabetes Father    Alcohol abuse Brother    Diabetes Brother    Diabetes Sister    Peripheral vascular disease Sister        amputation   Diabetes Daughter    Anxiety disorder Neg Hx    Bipolar disorder Neg Hx    Dementia Neg Hx    Depression Neg Hx     Drug abuse Neg Hx    Paranoid behavior Neg Hx    Schizophrenia Neg Hx    OCD Neg Hx    Seizures Neg Hx    Sexual abuse Neg Hx    Physical abuse Neg Hx    ADD / ADHD Neg Hx    Colon cancer Neg Hx    Colon polyps Neg Hx     Social History   Socioeconomic History   Marital status: Married    Spouse name: Not on file   Number of children: Not on file   Years of education: Not on file   Highest education level: Not on file  Occupational History   Not on file  Tobacco Use   Smoking status: Never   Smokeless tobacco: Never  Vaping Use   Vaping Use: Never used  Substance and Sexual Activity   Alcohol use: No    Comment: in the past but not since amputation in 2009, drank alcohol on weekends    Drug use: No   Sexual activity: Not Currently  Other Topics Concern   Not on file  Social History Narrative   Not on file   Social Determinants of Health   Financial Resource Strain: Not on file  Food Insecurity: Not on file  Transportation Needs: Not on file  Physical Activity: Not on file  Stress: Not on file  Social Connections: Not on file    Review of Systems: Gen: Denies fever, chills, anorexia. Denies fatigue, weakness, weight loss.  CV: Denies chest pain, palpitations, syncope, peripheral edema, and claudication. Resp: Denies dyspnea at rest, cough, wheezing, coughing up blood, and pleurisy. GI: Denies vomiting blood, jaundice, and fecal incontinence.   Denies dysphagia or odynophagia. Derm: Denies rash, itching, dry skin Psych: Denies depression, anxiety, memory loss, confusion. No homicidal or suicidal ideation.  Heme: Denies bruising, bleeding, and enlarged lymph nodes.  Physical Exam: There were no vitals taken for this visit. General:   Alert and oriented. No distress noted. Pleasant and cooperative.  Head:  Normocephalic and atraumatic. Eyes:  Conjuctiva clear without scleral icterus. Mouth:  Oral mucosa pink and moist. Good dentition. No lesions. Heart:   S1, S2 present without murmurs appreciated. Lungs:  Clear to auscultation bilaterally. No wheezes, rales, or rhonchi. No distress.  Abdomen:  +BS, soft, non-tender and non-distended. No rebound or guarding. No HSM or masses noted. Msk:  Symmetrical without gross deformities. Normal posture. Extremities:  Without edema. Neurologic:  Alert and  oriented x4 Psych:  Alert and cooperative. Normal mood and affect.

## 2020-12-06 ENCOUNTER — Ambulatory Visit: Payer: PPO | Admitting: Gastroenterology

## 2020-12-21 DIAGNOSIS — E1140 Type 2 diabetes mellitus with diabetic neuropathy, unspecified: Secondary | ICD-10-CM | POA: Diagnosis not present

## 2020-12-21 DIAGNOSIS — M79671 Pain in right foot: Secondary | ICD-10-CM | POA: Diagnosis not present

## 2020-12-21 DIAGNOSIS — M79672 Pain in left foot: Secondary | ICD-10-CM | POA: Diagnosis not present

## 2020-12-21 DIAGNOSIS — L110 Acquired keratosis follicularis: Secondary | ICD-10-CM | POA: Diagnosis not present

## 2020-12-21 DIAGNOSIS — E1151 Type 2 diabetes mellitus with diabetic peripheral angiopathy without gangrene: Secondary | ICD-10-CM | POA: Diagnosis not present

## 2020-12-27 DIAGNOSIS — Z1211 Encounter for screening for malignant neoplasm of colon: Secondary | ICD-10-CM | POA: Diagnosis not present

## 2021-01-19 DIAGNOSIS — E1151 Type 2 diabetes mellitus with diabetic peripheral angiopathy without gangrene: Secondary | ICD-10-CM | POA: Diagnosis not present

## 2021-01-19 DIAGNOSIS — I10 Essential (primary) hypertension: Secondary | ICD-10-CM | POA: Diagnosis not present

## 2021-01-19 DIAGNOSIS — G459 Transient cerebral ischemic attack, unspecified: Secondary | ICD-10-CM | POA: Diagnosis not present

## 2021-02-06 ENCOUNTER — Ambulatory Visit: Payer: PPO | Admitting: Gastroenterology

## 2021-03-09 DIAGNOSIS — Z7189 Other specified counseling: Secondary | ICD-10-CM | POA: Diagnosis not present

## 2021-03-09 DIAGNOSIS — E1165 Type 2 diabetes mellitus with hyperglycemia: Secondary | ICD-10-CM | POA: Diagnosis not present

## 2021-03-09 DIAGNOSIS — Z6822 Body mass index (BMI) 22.0-22.9, adult: Secondary | ICD-10-CM | POA: Diagnosis not present

## 2021-03-09 DIAGNOSIS — I10 Essential (primary) hypertension: Secondary | ICD-10-CM | POA: Diagnosis not present

## 2021-03-09 DIAGNOSIS — Z299 Encounter for prophylactic measures, unspecified: Secondary | ICD-10-CM | POA: Diagnosis not present

## 2021-03-09 DIAGNOSIS — Z789 Other specified health status: Secondary | ICD-10-CM | POA: Diagnosis not present

## 2021-03-09 DIAGNOSIS — Z79899 Other long term (current) drug therapy: Secondary | ICD-10-CM | POA: Diagnosis not present

## 2021-03-09 DIAGNOSIS — E7800 Pure hypercholesterolemia, unspecified: Secondary | ICD-10-CM | POA: Diagnosis not present

## 2021-03-09 DIAGNOSIS — R5383 Other fatigue: Secondary | ICD-10-CM | POA: Diagnosis not present

## 2021-03-09 DIAGNOSIS — Z1211 Encounter for screening for malignant neoplasm of colon: Secondary | ICD-10-CM | POA: Diagnosis not present

## 2021-03-09 DIAGNOSIS — Z125 Encounter for screening for malignant neoplasm of prostate: Secondary | ICD-10-CM | POA: Diagnosis not present

## 2021-03-09 DIAGNOSIS — Z1331 Encounter for screening for depression: Secondary | ICD-10-CM | POA: Diagnosis not present

## 2021-03-09 DIAGNOSIS — Z1339 Encounter for screening examination for other mental health and behavioral disorders: Secondary | ICD-10-CM | POA: Diagnosis not present

## 2021-03-09 DIAGNOSIS — Z0000 Encounter for general adult medical examination without abnormal findings: Secondary | ICD-10-CM | POA: Diagnosis not present

## 2021-03-19 DIAGNOSIS — Z1211 Encounter for screening for malignant neoplasm of colon: Secondary | ICD-10-CM | POA: Diagnosis not present

## 2021-03-19 DIAGNOSIS — Z1212 Encounter for screening for malignant neoplasm of rectum: Secondary | ICD-10-CM | POA: Diagnosis not present

## 2021-03-20 DIAGNOSIS — G459 Transient cerebral ischemic attack, unspecified: Secondary | ICD-10-CM | POA: Diagnosis not present

## 2021-03-20 DIAGNOSIS — E1151 Type 2 diabetes mellitus with diabetic peripheral angiopathy without gangrene: Secondary | ICD-10-CM | POA: Diagnosis not present

## 2021-03-20 DIAGNOSIS — I10 Essential (primary) hypertension: Secondary | ICD-10-CM | POA: Diagnosis not present

## 2021-03-22 DIAGNOSIS — L110 Acquired keratosis follicularis: Secondary | ICD-10-CM | POA: Diagnosis not present

## 2021-03-22 DIAGNOSIS — E1140 Type 2 diabetes mellitus with diabetic neuropathy, unspecified: Secondary | ICD-10-CM | POA: Diagnosis not present

## 2021-03-22 DIAGNOSIS — E1151 Type 2 diabetes mellitus with diabetic peripheral angiopathy without gangrene: Secondary | ICD-10-CM | POA: Diagnosis not present

## 2021-03-22 DIAGNOSIS — M79672 Pain in left foot: Secondary | ICD-10-CM | POA: Diagnosis not present

## 2021-03-22 DIAGNOSIS — M79671 Pain in right foot: Secondary | ICD-10-CM | POA: Diagnosis not present

## 2021-03-28 ENCOUNTER — Telehealth (INDEPENDENT_AMBULATORY_CARE_PROVIDER_SITE_OTHER): Payer: PPO | Admitting: Psychiatry

## 2021-03-28 ENCOUNTER — Encounter (HOSPITAL_COMMUNITY): Payer: Self-pay | Admitting: Psychiatry

## 2021-03-28 ENCOUNTER — Other Ambulatory Visit: Payer: Self-pay

## 2021-03-28 DIAGNOSIS — F322 Major depressive disorder, single episode, severe without psychotic features: Secondary | ICD-10-CM

## 2021-03-28 DIAGNOSIS — F09 Unspecified mental disorder due to known physiological condition: Secondary | ICD-10-CM

## 2021-03-28 MED ORDER — FLUOXETINE HCL 20 MG PO CAPS: 20.0000 mg | ORAL_CAPSULE | Freq: Every morning | ORAL | 2 refills | Status: DC

## 2021-03-28 MED ORDER — ARIPIPRAZOLE 5 MG PO TABS: 5.0000 mg | ORAL_TABLET | Freq: Every day | ORAL | 2 refills | Status: DC

## 2021-03-28 MED ORDER — VALBENAZINE TOSYLATE 40 MG PO CAPS: 40.0000 mg | ORAL_CAPSULE | Freq: Every day | ORAL | 2 refills | Status: DC

## 2021-03-28 NOTE — Progress Notes (Signed)
Virtual Visit via Telephone Note  I connected with Chad Grant on 03/28/21 at  1:00 PM EST by telephone and verified that I am speaking with the correct person using two identifiers.  Location: Patient: home Provider: office   I discussed the limitations, risks, security and privacy concerns of performing an evaluation and management service by telephone and the availability of in person appointments. I also discussed with the patient that there may be a patient responsible charge related to this service. The patient expressed understanding and agreed to proceed.      I discussed the assessment and treatment plan with the patient. The patient was provided an opportunity to ask questions and all were answered. The patient agreed with the plan and demonstrated an understanding of the instructions.   The patient was advised to call back or seek an in-person evaluation if the symptoms worsen or if the condition fails to improve as anticipated.  I provided 15 minutes of non-face-to-face time during this encounter.   Diannia Ruder, MD  [facility] MD/PA/NP OP Progress Note  03/28/2021 1:23 PM Chad Grant  MRN:  [id number]  Chief Complaint:  Chief Complaint  Patient presents with   Depression   Follow-up   Memory Loss   HPI: This patient is a 71 year-old married black male who lives with his wife in [place]. He has 2 children who are grown. 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   Apparently when the patient was in better health and used to work he was a happy go lucky person according to his wife. Since she's had the amputation he has gone downhill. In fact he was hospitalized in June for severe depression. He's been on numerous medications in the past. He used to be angry and aggressive but now seems blunted and shut down. He has no interest in life. He sleeps all day or watches TV. He refuses to bathe which is a  big problem for his wife. He is very defensive and gets angry when I questioned his wife about his status, almost to the point of paranoia. He claims he can't sleep but his wife reports that he sleeps through the day and not at night. He denies any pain   The patient and wife return for follow-up after about a year.  She states that he is doing about the same.  He still has difficulties with short-term memory.  At times he is irritable but for the most part his mood is stable.  He claims he is not depressed or significantly anxious.  He is sleeping well.  He still is constantly moving or shuffling his leg and sometimes head-nodding.  I am not sure if this is tardive dyskinesia or not.  Nevertheless we have tried Cogentin which helps more with Parkinson-like symptoms and has not really helped the symptoms.  I suggested we try a low-dose of Ingrezza and the wife is willing to give this a try Visit Diagnosis:    ICD-10-CM   1. Severe single current episode of major depressive disorder, without psychotic features (HCC)  F32.2     2. Cognitive disorder  F09       Past Psychiatric History: The patient has been seen in our office since 2009.  He has been on numerous mood stabilizers and antidepressants  Past Medical History:  Past Medical History:  Diagnosis Date   Dementia (  HCC)    Depression    Diabetes mellitus type II    Hypercholesteremia    Hypertension    S/P BKA (below knee amputation) unilateral (HCC)    Stroke (HCC) 2013   left sided weakness   TIA (transient ischemic attack)     Past Surgical History:  Procedure Laterality Date   ABDOMINAL AORTAGRAM N/A 04/29/2012   Procedure: ABDOMINAL Ronny Flurry;  Surgeon: Nada Libman, MD;  Location: [facility] CATH LAB;  Service: Cardiovascular;  Laterality: N/A;   AMPUTATION Right 05/15/2018   Procedure: AMPUTATION RIGHT THIRD TOE;  Surgeon: Ferman Hamming, DPM;  Location: [facility] ORS;  Service: Podiatry;  Laterality: Right;   CATARACT EXTRACTION  W/PHACO Left 12/04/2015   Procedure: CATARACT EXTRACTION PHACO AND INTRAOCULAR LENS PLACEMENT LEFT EYE CDE=19.92;  Surgeon: Gemma Payor, MD;  Location: [facility];  Service: Ophthalmology;  Laterality: Left;  left   cataract removed Bilateral    left leg amputa     BKA   left leg amputated     SHOULDER SURGERY Right    rotator cuff    Family Psychiatric History: see below  Family History:  Family History  Problem Relation Age of Onset   Heart disease Mother    Diabetes Father    Alcohol abuse Brother    Diabetes Brother    Diabetes Sister    Peripheral vascular disease Sister        amputation   Diabetes Daughter    Anxiety disorder Neg Hx    Bipolar disorder Neg Hx    Dementia Neg Hx    Depression Neg Hx    Drug abuse Neg Hx    Paranoid behavior Neg Hx    Schizophrenia Neg Hx    OCD Neg Hx    Seizures Neg Hx    Sexual abuse Neg Hx    Physical abuse Neg Hx    ADD / ADHD Neg Hx    Colon cancer Neg Hx    Colon polyps Neg Hx     Social History:  Social History   Socioeconomic History   Marital status: Married    Spouse name: Not on file   Number of children: Not on file   Years of education: Not on file   Highest education level: Not on file  Occupational History   Not on file  Tobacco Use   Smoking status: Never   Smokeless tobacco: Never  Vaping Use   Vaping Use: Never used  Substance and Sexual Activity   Alcohol use: No    Comment: in the past but not since amputation in 2009, drank alcohol on weekends    Drug use: No   Sexual activity: Not Currently  Other Topics Concern   Not on file  Social History Narrative   Not on file   Social Determinants of Health   Financial Resource Strain: Not on file  Food Insecurity: Not on file  Transportation Needs: Not on file  Physical Activity: Not on file  Stress: Not on file  Social Connections: Not on file    Allergies: No Known Allergies  Metabolic Disorder Labs: Lab Results  Component Value Date    HGBA1C 7.2 (H) 05/13/2018   MPG 159.94 05/13/2018   MPG 147 08/14/2007   No results found for: PROLACTIN No results found for: CHOL, TRIG, HDL, CHOLHDL, VLDL, LDLCALC No results found for: TSH  Therapeutic Level Labs: No results found for: LITHIUM No results found for: VALPROATE No components found for:  CBMZ  Current Medications: Current Outpatient Medications  Medication Sig Dispense Refill   valbenazine (INGREZZA) 40 MG capsule Take 1 capsule (40 mg total) by mouth daily. 30 capsule 2   ARIPiprazole (ABILIFY) 5 MG tablet Take 1 tablet (5 mg total) by mouth at bedtime. 90 tablet 2   clopidogrel (PLAVIX) 75 MG tablet Take 1 tablet (75 mg total) by mouth daily.     FLUoxetine (PROZAC) 20 MG capsule Take 1 capsule (20 mg total) by mouth every morning. 90 capsule 2   glipiZIDE (GLUCOTROL) 5 MG tablet Take 5 mg by mouth daily.     lipase/protease/amylase (CREON) 36000 UNITS CPEP capsule Take 2 capsules (72,000 Units total) by mouth 3 (three) times daily with meals. 1 capsule with snacks 240 capsule 3   loperamide (IMODIUM) 2 MG capsule Take by mouth as needed for diarrhea or loose stools.     Melatonin 5 MG CAPS Take 5 mg by mouth at bedtime as needed (sleep).     metoprolol tartrate (LOPRESSOR) 25 MG tablet Take 1 tablet (25 mg total) by mouth 2 (two) times daily. (Patient taking differently: Take 25 mg by mouth daily. )     polyethylene glycol-electrolytes (TRILYTE) 420 g solution Take 4,000 mLs by mouth as directed. 4000 mL 0   simvastatin (ZOCOR) 40 MG tablet Take 40 mg by mouth every evening.     No current facility-administered medications for this visit.     Musculoskeletal: Strength & Muscle Tone: na Gait & Station: na Patient leans: N/A  Psychiatric Specialty Exam: Review of Systems  Neurological:  Positive for tremors.  Psychiatric/Behavioral:  Positive for confusion and decreased concentration.   All other systems reviewed and are negative.  There were no vitals  taken for this visit.There is no height or weight on file to calculate BMI.  General Appearance: NA  Eye Contact:  NA  Speech:  Clear and Coherent  Volume:  Normal  Mood:  Irritable  Affect:  NA  Thought Process:  Goal Directed  Orientation:  Person and place  Thought Content: WDL   Suicidal Thoughts:  No  Homicidal Thoughts:  No  Memory:  Immediate;   Poor Recent;   Poor Remote;   Poor  Judgement:  Impaired  Insight:  Lacking  Psychomotor Activity:  EPS and Tremor  Concentration:  Concentration: Fair and Attention Span: Fair  Recall:  Poor  Fund of Knowledge: Fair  Language: Good  Akathisia:  No  Handed:  Right  AIMS (if indicated): Wife describes shuffling foot as well as nodding head  Assets:  Resilience Social Support  ADL's:  Intact  Cognition: Impaired,  Mild  Sleep:  Good   Screenings: AUDIT    Flowsheet Row [facility]  Alcohol Use Disorder Identification Test Final Score (AUDIT) 0      PHQ2-9    Flowsheet Row [facility] Video Visit from 04/05/2020 [facility]  PHQ-2 Total Score 0 0      Flowsheet Row Video Visit from [facility]/28/2021 [facility] Video Visit from 04/05/2020 in [facility]  C-SSRS RISK CATEGORY No Risk No Risk        Assessment and Plan: Patient is a 71 year old male with a history of depression anger and irritability and moderate dementia.  He does okay as long as his wife is there to supervise.  It sounds  like he is developing some tardive dyskinesia symptoms so we will start Ingrezza 40 mg daily.  He will continue Prozac 20 mg daily for depression and Abilify every night for agitation.  He will return to see me in 6 months or call sooner as needed  Collaboration of  Care: Collaboration of Care: Primary Care Provider AEB chart notes will be made available to PCP at patient's request  Patient/Guardian was advised Release of Information must be obtained prior to any record release in order to collaborate their care with an outside provider. Patient/Guardian was advised if they have not already done so to contact the registration department to sign all necessary forms in order for [place] to release information regarding their care.   Consent: Patient/Guardian gives verbal consent for treatment and assignment of benefits for services provided during this visit. Patient/Guardian expressed understanding and agreed to proceed.    Diannia Ruder, MD 03/28/2021, 1:23 PM

## 2021-03-29 ENCOUNTER — Other Ambulatory Visit (HOSPITAL_COMMUNITY): Payer: Self-pay | Admitting: Psychiatry

## 2021-03-29 ENCOUNTER — Telehealth (HOSPITAL_COMMUNITY): Payer: Self-pay | Admitting: *Deleted

## 2021-03-29 DIAGNOSIS — Z299 Encounter for prophylactic measures, unspecified: Secondary | ICD-10-CM | POA: Diagnosis not present

## 2021-03-29 DIAGNOSIS — I10 Essential (primary) hypertension: Secondary | ICD-10-CM | POA: Diagnosis not present

## 2021-03-29 DIAGNOSIS — Z789 Other specified health status: Secondary | ICD-10-CM | POA: Diagnosis not present

## 2021-03-29 DIAGNOSIS — R1110 Vomiting, unspecified: Secondary | ICD-10-CM | POA: Diagnosis not present

## 2021-03-29 DIAGNOSIS — E1165 Type 2 diabetes mellitus with hyperglycemia: Secondary | ICD-10-CM | POA: Diagnosis not present

## 2021-03-29 DIAGNOSIS — R197 Diarrhea, unspecified: Secondary | ICD-10-CM | POA: Diagnosis not present

## 2021-03-29 MED ORDER — VALBENAZINE TOSYLATE 40 MG PO CAPS: 40.0000 mg | ORAL_CAPSULE | Freq: Every day | ORAL | 2 refills | Status: DC

## 2021-03-29 NOTE — Telephone Encounter (Signed)
sent 

## 2021-03-29 NOTE — Telephone Encounter (Signed)
Kim from [employer] called about patient Chad Grant. They are needing an ICD 10 code attached with the script. They would like for provider to please resend script with ICD10 code on script.  ? ?Phone number is [phone] ? ?Fax number is [phone] ? ? ? ?

## 2021-03-30 DIAGNOSIS — Z89511 Acquired absence of right leg below knee: Secondary | ICD-10-CM | POA: Diagnosis not present

## 2021-03-30 DIAGNOSIS — Z89512 Acquired absence of left leg below knee: Secondary | ICD-10-CM | POA: Diagnosis not present

## 2021-03-30 DIAGNOSIS — E7800 Pure hypercholesterolemia, unspecified: Secondary | ICD-10-CM | POA: Diagnosis not present

## 2021-03-30 DIAGNOSIS — I10 Essential (primary) hypertension: Secondary | ICD-10-CM | POA: Diagnosis not present

## 2021-03-30 DIAGNOSIS — Z20822 Contact with and (suspected) exposure to covid-19: Secondary | ICD-10-CM | POA: Diagnosis not present

## 2021-03-30 DIAGNOSIS — Z7984 Long term (current) use of oral hypoglycemic drugs: Secondary | ICD-10-CM | POA: Diagnosis not present

## 2021-03-30 DIAGNOSIS — I6523 Occlusion and stenosis of bilateral carotid arteries: Secondary | ICD-10-CM | POA: Diagnosis not present

## 2021-03-30 DIAGNOSIS — R1310 Dysphagia, unspecified: Secondary | ICD-10-CM | POA: Diagnosis not present

## 2021-03-30 DIAGNOSIS — E089 Diabetes mellitus due to underlying condition without complications: Secondary | ICD-10-CM | POA: Diagnosis not present

## 2021-03-30 DIAGNOSIS — I700 Atherosclerosis of aorta: Secondary | ICD-10-CM | POA: Diagnosis not present

## 2021-03-30 DIAGNOSIS — L639 Alopecia areata, unspecified: Secondary | ICD-10-CM | POA: Diagnosis not present

## 2021-03-30 DIAGNOSIS — Z7902 Long term (current) use of antithrombotics/antiplatelets: Secondary | ICD-10-CM | POA: Diagnosis not present

## 2021-03-30 DIAGNOSIS — R4781 Slurred speech: Secondary | ICD-10-CM | POA: Diagnosis not present

## 2021-03-30 DIAGNOSIS — I69354 Hemiplegia and hemiparesis following cerebral infarction affecting left non-dominant side: Secondary | ICD-10-CM | POA: Diagnosis not present

## 2021-03-30 DIAGNOSIS — E119 Type 2 diabetes mellitus without complications: Secondary | ICD-10-CM | POA: Diagnosis not present

## 2021-03-30 DIAGNOSIS — R29810 Facial weakness: Secondary | ICD-10-CM | POA: Diagnosis not present

## 2021-03-30 DIAGNOSIS — Z79899 Other long term (current) drug therapy: Secondary | ICD-10-CM | POA: Diagnosis not present

## 2021-03-30 DIAGNOSIS — F0280 Dementia in other diseases classified elsewhere without behavioral disturbance: Secondary | ICD-10-CM | POA: Diagnosis not present

## 2021-03-30 DIAGNOSIS — R531 Weakness: Secondary | ICD-10-CM | POA: Diagnosis not present

## 2021-03-30 DIAGNOSIS — I6381 Other cerebral infarction due to occlusion or stenosis of small artery: Secondary | ICD-10-CM | POA: Diagnosis not present

## 2021-03-30 DIAGNOSIS — I69391 Dysphagia following cerebral infarction: Secondary | ICD-10-CM | POA: Diagnosis not present

## 2021-03-30 DIAGNOSIS — Z66 Do not resuscitate: Secondary | ICD-10-CM | POA: Diagnosis not present

## 2021-03-30 DIAGNOSIS — R278 Other lack of coordination: Secondary | ICD-10-CM | POA: Diagnosis not present

## 2021-03-30 DIAGNOSIS — G309 Alzheimer's disease, unspecified: Secondary | ICD-10-CM | POA: Diagnosis not present

## 2021-03-30 DIAGNOSIS — Z9181 History of falling: Secondary | ICD-10-CM | POA: Diagnosis not present

## 2021-03-30 DIAGNOSIS — I639 Cerebral infarction, unspecified: Secondary | ICD-10-CM | POA: Diagnosis not present

## 2021-03-30 DIAGNOSIS — R2689 Other abnormalities of gait and mobility: Secondary | ICD-10-CM | POA: Diagnosis not present

## 2021-03-30 DIAGNOSIS — Z7409 Other reduced mobility: Secondary | ICD-10-CM | POA: Diagnosis not present

## 2021-03-30 DIAGNOSIS — I63232 Cerebral infarction due to unspecified occlusion or stenosis of left carotid arteries: Secondary | ICD-10-CM | POA: Diagnosis not present

## 2021-03-30 DIAGNOSIS — R4189 Other symptoms and signs involving cognitive functions and awareness: Secondary | ICD-10-CM | POA: Diagnosis not present

## 2021-03-30 NOTE — H&P (Signed)
 [facility]  History & Physical      Patient Care Team: [name], MD as PCP - General (Internal Medicine)  Assessment & Plan   Active Problems:   Weakness   Hemiparesis affecting left side as late effect of stroke (CMS-HCC)   Diabetes (CMS-HCC)   Hx of BKA, left (CMS-HCC)    CT scan and MRI did not show any acute event and will observe the patient to evaluate further and understand his needs.  Request physical therapy and a speech evaluation to be done.  Noted to have elevated WBC count do not have the specific etiology.  Patient remains afebrile chest x-ray shows no infiltrate urine analysis does not seem to show any infection will repeat the labs in the morning.  Monitor his Accu-Cheks and sliding scale for diabetes.  Follow his blood pressure.  If he is stable eating and drinking better patient could be discharged when appropriate hospitalist service helping the patient.  DVT prophylaxis:  not indicated since patient is ambulatory and short stay expected Anticipated disposition: To Home Estimated discharge:  1-2 days  Observation Status.  I expect that evaluation and management of this patient's condition will require fewer than 2 midnights in the acute setting.  Chief Complaint   Weakness  History Of Present Illness   Chad Grant is a  71 y.o.  male with a history of diabetes, left hemiparesis due to prior stroke and left BKA was brought in by family with a history of difficulty of swallowing and was noted to have slurred speech last night as the symptoms continued patient was brought to the ER.  As per ER note patient came yesterday to the ER and left without being seen.  While I am evaluating the patient there is no family members and he seems to be awake alert.  Coughing a little bit and his residual weakness on the left side seems to be at baseline.  Review Of Systems  A complete review of systems in negative other than what is stated in the  HPI.  Allergies  Patient has no known allergies.  Home Medications   Prior to Admission medications   Medication Dose, Route, Frequency  ARIPiprazole  (ABILIFY ) 5 MG tablet 5 mg, Oral, Nightly  clopidogreL  (PLAVIX ) 75 mg tablet 1 tablet, Oral, Daily (standard)  FLUoxetine  (PROZAC ) 20 MG capsule 1 capsule, Oral, Every morning  glipiZIDE (GLUCOTROL) 5 MG tablet 5 mg, Oral, Daily (standard)  metoprolol  tartrate (LOPRESSOR ) 25 MG tablet 25 mg, Oral, 2 times a day  ondansetron  (ZOFRAN ) 4 MG tablet 4 mg, Oral, Every 8 hours PRN  pantoprazole  (PROTONIX ) 20 MG tablet 20 mg, Oral, Daily (standard)  simvastatin  (ZOCOR ) 40 MG tablet 5 mg, Oral, Daily  valbenazine  40 mg cap 1 capsule, Oral, Daily (standard)    Medical History   Past Medical History:  Diagnosis Date  . Carotid artery disease (CMS-HCC)   . Dementia (CMS-HCC)   . Diabetes mellitus (CMS-HCC)   . Hypercholesterolemia   . Hypertension   . Stroke (CMS-HCC)     Surgical History   Past Surgical History:  Procedure Laterality Date  . BELOW KNEE LEG AMPUTATION Left     Social History   Social History   Social History Narrative  . Not on file   Social History   Tobacco Use  . Smoking status: Never  . Smokeless tobacco: Never  Substance Use Topics  . Alcohol  use: Not Currently  . Drug use: Not Currently  Family History  History reviewed. No pertinent family history.  No family status information on file.    Code Status  No Order  Objective  Temp:  [36.2 C (97.1 F)-36.4 C (97.6 F)] 36.2 C (97.1 F) Heart Rate:  [53-58] 58 Resp:  [12-22] 20 BP: (127-157)/(60-78) 141/60 MAP (mmHg):  [77-97] 77 SpO2:  [100 %] 100 % BMI (Calculated):  [20.96] 20.96  Physical Exam   General:  no acute distress    Neuro:   awake CV:   regular rate and rhythm Pulmonary:  lungs are clear Abdomen:  soft, nontender, nondistended Extremities:  no edema MSK:   Left-sided weakness.  Some speech  difficulty. HEENT:  normacephalic and atraumatic GU:   not assessed SKIN:   no petechiae  Lab Results    CBC Recent Labs    03/30/21 1332  WBC 18.0*  HGB 13.7  HCT 40.9  PLT 185    CMP Recent Labs    03/30/21 1332  NA 137  K 3.7  CL 98  CO2 29.5  BUN 28*  CREATININE 1.22  GLU 222*  CALCIUM  9.4  ALBUMIN 4.1  PROT 8.8*  BILITOT 1.7*  AST 21  ALT 29  ALKPHOS 122*    OTHER LABS Recent Labs    03/30/21 1332  TROPONINI 10  INR 1.08  APTT 22.1   No results for input(s): ESR, CRP, HSCRP, ANA, RF, DSDNA, VITAMINB12, FOLATE, IRON, LABIRON, TIBC, FERRITIN, RETIC, C3, C4, VITDTOTAL, LDH, HAPTM, URICACID, HEPP4, CEA, CA125, RAPSCRN, CDIFRPCR, CDIFFNAP1, HIV12SCRN, HIVCP, TBCELLQN, HEPAIGM, HEPBSAG, HEPBIGM, HEPCAB, MITOAB, HBEAG, IGM, IGA, IGG in the last 72 hours.  URINE Recent Labs    03/30/21 1654  WBCUA 0  NITRITE Negative  LEUKOCYTESUR Negative  RBCUA 2  BLOODU Trace*  GLUCOSEU 100 mg/dL*  PROTEINUA 30 mg/dL*  KETONESU 15 mg/dL*    BODY FLUIDS No results for input(s): FTYP1, WBCFLUID, FNEUT, LYMPHSFL, FMONO, EOSFL, RBCFL, CLARITYFLUID, COLORFL, ALBFL, LDHFL, PROTEINFL, PHFL in the last 72 hours.  Invalid input(s): LDHTYPE, PROTFLTYPE  ABG No results for input(s): O2SOUR, FIO2ART, PHART, PCO2ART, PO2ART, HCO3ART, O2SATART, BEART in the last 72 hours.  Pending Labs    Imaging   XR Chest Portable  Result Date: 03/30/2021 CLINICAL DATA:  weakness ; OTHER Pt family reports left sided weakness, difficulty swallowing since 1900 yesterday. Hx TIA. EXAM: PORTABLE CHEST 1 VIEW COMPARISON:  None. FINDINGS: The heart and mediastinal contours are within normal limits. Aortic calcification. Low lung volumes. No focal consolidation. No pulmonary edema. No pleural effusion. No pneumothorax. No acute osseous abnormality.   1. No active disease. 2.  Aortic Atherosclerosis (ICD10-I70.0). Electronically Signed   By: Morgane  Naveau M.D.   On: 03/30/2021 15:58   CT Head  Wo Contrast  Result Date: 03/30/2021 CLINICAL DATA:  Slurred speech, difficulty swallowing beginning last night, left facial droop, left weakness EXAM: CT HEAD WITHOUT CONTRAST TECHNIQUE: Contiguous axial images were obtained from the base of the skull through the vertex without intravenous contrast. RADIATION DOSE REDUCTION: This exam was performed according to the departmental dose-optimization program which includes automated exposure control, adjustment of the mA and/or kV according to patient size and/or use of iterative reconstruction technique. COMPARISON:  Reports from multiple prior head CTs are available most recently 04/23/2018, but these images are not retrievable at the time of initial dictation. FINDINGS: Brain: Parenchymal volume is normal for age. There are multiple lacunar infarcts in the bilateral basal ganglia, thalami, and caudate nuclei consistent with age-indeterminate infarcts. Confluent hypodensity  in the right frontal lobe periventricular white matter is also noted. There is ex vacuo dilatation of the right lateral ventricle suggesting that at least some of these infarcts are remote There is no evidence of acute intracranial hemorrhage or extra-axial fluid collection. The ventricles are otherwise normal in size. There is no mass lesion. There is no mass effect or midline shift. Vascular: There is calcification of the bilateral cavernous ICAs. Skull: Normal. Negative for fracture or focal lesion. Sinuses/Orbits: The paranasal sinuses are clear. Bilateral lens implants are in place. The globes and orbits are otherwise unremarkable. Other: None.   1. Multiple lacunar infarcts in the bilateral basal ganglia are all likely remote, though acute ischemia can not be entirely excluded (prior images are not available for direct comparison at the time of dictation). There is no large vessel territorial infarct. 2. No acute intracranial hemorrhage or extra-axial fluid collection. These results were  called by telephone at the time of interpretation on 03/30/2021 at 1:38 pm to provider [facility] , who verbally acknowledged these results. Electronically Signed   By: Maude Harry M.D.   On: 03/30/2021 13:42   MRA Head Wo Contrast  Result Date: 03/30/2021 CLINICAL DATA:  CVA; Neuro deficit, acute, stroke suspected EXAM: MRI HEAD WITHOUT CONTRAST MRA HEAD WITHOUT CONTRAST TECHNIQUE: Multiplanar, multi-echo pulse sequences of the brain and surrounding structures were acquired without intravenous contrast. Angiographic images of the Circle of Willis were acquired using MRA technique without intravenous contrast. COMPARISON:  None FINDINGS: MRI HEAD Brain: There is no acute infarction or intracranial hemorrhage. There are multiple chronic small vessel infarcts involving the basal ganglia, thalamus, and central white matter bilaterally. There is wallerian degeneration along the right cerebral peduncle. Additional small chronic infarcts of the right pons and bilateral cerebellum. Other patchy and confluent areas of T2 hyperintensity in the supratentorial white matter are nonspecific but probably reflect chronic microvascular ischemic changes. There is no intracranial mass, mass effect, or edema. There is no hydrocephalus or extra-axial fluid collection. Vascular: Major vessel flow voids at the skull base are preserved. Skull and upper cervical spine: Normal marrow signal is preserved. Sinuses/Orbits: Paranasal sinuses are aerated. Bilateral lens replacements. Other: Sella is unremarkable.  Mastoid air cells are clear. MRA HEAD Degree of stenosis difficult to assess due to motion degradation. Intracranial internal carotid arteries are patent with atherosclerotic irregularity. There is moderate to marked stenosis of the supraclinoid left ICA. Middle and anterior cerebral arteries are patent with atherosclerotic irregularity. Intracranial right vertebral artery is patent. There is diminished flow related enhancement  of the proximal intracranial left vertebral artery. The visualized extracranial portion appears patent. Improved flow related enhancement at the PICA origin, which could reflect retrograde flow. Basilar artery is patent. Posterior cerebral arteries are patent with atherosclerotic irregularity. No aneurysm.   No acute infarction, hemorrhage, or mass. Chronic microvascular ischemic changes with multiple chronic small vessel infarcts. Suboptimal vascular evaluation due to motion degradation. There is moderate to marked stenosis of the supraclinoid left ICA. Possible stenosis or occlusion of the proximal intracranial left vertebral artery with flow beyond the PICA origin that may be retrograde. Atherosclerotic irregularity of ACA, MCA, PCA large and medium vessel branches. Electronically Signed   By: Santina Blanch M.D.   On: 03/30/2021 15:25   MRI brain without contrast  Result Date: 03/30/2021 CLINICAL DATA:  CVA; Neuro deficit, acute, stroke suspected EXAM: MRI HEAD WITHOUT CONTRAST MRA HEAD WITHOUT CONTRAST TECHNIQUE: Multiplanar, multi-echo pulse sequences of the brain and surrounding structures were  acquired without intravenous contrast. Angiographic images of the Circle of Willis were acquired using MRA technique without intravenous contrast. COMPARISON:  None FINDINGS: MRI HEAD Brain: There is no acute infarction or intracranial hemorrhage. There are multiple chronic small vessel infarcts involving the basal ganglia, thalamus, and central white matter bilaterally. There is wallerian degeneration along the right cerebral peduncle. Additional small chronic infarcts of the right pons and bilateral cerebellum. Other patchy and confluent areas of T2 hyperintensity in the supratentorial white matter are nonspecific but probably reflect chronic microvascular ischemic changes. There is no intracranial mass, mass effect, or edema. There is no hydrocephalus or extra-axial fluid collection. Vascular: Major vessel  flow voids at the skull base are preserved. Skull and upper cervical spine: Normal marrow signal is preserved. Sinuses/Orbits: Paranasal sinuses are aerated. Bilateral lens replacements. Other: Sella is unremarkable.  Mastoid air cells are clear. MRA HEAD Degree of stenosis difficult to assess due to motion degradation. Intracranial internal carotid arteries are patent with atherosclerotic irregularity. There is moderate to marked stenosis of the supraclinoid left ICA. Middle and anterior cerebral arteries are patent with atherosclerotic irregularity. Intracranial right vertebral artery is patent. There is diminished flow related enhancement of the proximal intracranial left vertebral artery. The visualized extracranial portion appears patent. Improved flow related enhancement at the PICA origin, which could reflect retrograde flow. Basilar artery is patent. Posterior cerebral arteries are patent with atherosclerotic irregularity. No aneurysm.   No acute infarction, hemorrhage, or mass. Chronic microvascular ischemic changes with multiple chronic small vessel infarcts. Suboptimal vascular evaluation due to motion degradation. There is moderate to marked stenosis of the supraclinoid left ICA. Possible stenosis or occlusion of the proximal intracranial left vertebral artery with flow beyond the PICA origin that may be retrograde. Atherosclerotic irregularity of ACA, MCA, PCA large and medium vessel branches. Electronically Signed   By: Santina Blanch M.D.   On: 03/30/2021 15:25    ECG   normal sinus rhythm    [name], MD

## 2021-03-31 DIAGNOSIS — I69354 Hemiplegia and hemiparesis following cerebral infarction affecting left non-dominant side: Secondary | ICD-10-CM | POA: Diagnosis not present

## 2021-03-31 DIAGNOSIS — Z79899 Other long term (current) drug therapy: Secondary | ICD-10-CM | POA: Diagnosis not present

## 2021-03-31 DIAGNOSIS — E119 Type 2 diabetes mellitus without complications: Secondary | ICD-10-CM | POA: Diagnosis not present

## 2021-03-31 DIAGNOSIS — Z794 Long term (current) use of insulin: Secondary | ICD-10-CM | POA: Diagnosis not present

## 2021-03-31 DIAGNOSIS — D72829 Elevated white blood cell count, unspecified: Secondary | ICD-10-CM | POA: Diagnosis not present

## 2021-03-31 DIAGNOSIS — Z89512 Acquired absence of left leg below knee: Secondary | ICD-10-CM | POA: Diagnosis not present

## 2021-04-01 DIAGNOSIS — Z794 Long term (current) use of insulin: Secondary | ICD-10-CM | POA: Diagnosis not present

## 2021-04-01 DIAGNOSIS — Z79899 Other long term (current) drug therapy: Secondary | ICD-10-CM | POA: Diagnosis not present

## 2021-04-01 DIAGNOSIS — I69354 Hemiplegia and hemiparesis following cerebral infarction affecting left non-dominant side: Secondary | ICD-10-CM | POA: Diagnosis not present

## 2021-04-01 DIAGNOSIS — Z89512 Acquired absence of left leg below knee: Secondary | ICD-10-CM | POA: Diagnosis not present

## 2021-04-01 DIAGNOSIS — D72829 Elevated white blood cell count, unspecified: Secondary | ICD-10-CM | POA: Diagnosis not present

## 2021-04-01 DIAGNOSIS — E119 Type 2 diabetes mellitus without complications: Secondary | ICD-10-CM | POA: Diagnosis not present

## 2021-04-02 DIAGNOSIS — L639 Alopecia areata, unspecified: Secondary | ICD-10-CM | POA: Diagnosis not present

## 2021-04-02 DIAGNOSIS — Z89511 Acquired absence of right leg below knee: Secondary | ICD-10-CM | POA: Diagnosis not present

## 2021-04-02 DIAGNOSIS — E089 Diabetes mellitus due to underlying condition without complications: Secondary | ICD-10-CM | POA: Diagnosis not present

## 2021-04-02 DIAGNOSIS — R531 Weakness: Secondary | ICD-10-CM | POA: Diagnosis not present

## 2021-04-03 DIAGNOSIS — R531 Weakness: Secondary | ICD-10-CM | POA: Diagnosis not present

## 2021-04-03 DIAGNOSIS — E089 Diabetes mellitus due to underlying condition without complications: Secondary | ICD-10-CM | POA: Diagnosis not present

## 2021-04-03 DIAGNOSIS — Z89511 Acquired absence of right leg below knee: Secondary | ICD-10-CM | POA: Diagnosis not present

## 2021-04-03 DIAGNOSIS — L639 Alopecia areata, unspecified: Secondary | ICD-10-CM | POA: Diagnosis not present

## 2021-04-03 NOTE — Telephone Encounter (Signed)
Spoke with [employer] and spoke with the pharmacist and he stated he will send script to [facility] pharmacy that have the Dx code on it from 03-29-2021 ?

## 2021-04-03 NOTE — Progress Notes (Signed)
 Daily Progress Note  Assessment/Plan:  Active Problems:   Weakness   Hemiparesis affecting left side as late effect of stroke (CMS-HCC)   Diabetes (CMS-HCC)   Hx of BKA, left (CMS-HCC)  Will change diet per nutrition and guidance after modified barium swallow.  Patient for skilled nursing placement.  Continue to monitor his Accu-Cheks no family at the bedside at the present time we will continue his meds.  LOS: 0 days   Subjective:  Interval History: 71 y.o. male  admitted 03/30/2021  He  is awake and alert.  Patient about to go for modified barium swallow...   Objective:  Vital signs in last 24 hours: Temp:  [36.7 C (98 F)-36.9 C (98.5 F)] 36.9 C (98.5 F) Heart Rate:  [52-65] 61 Resp:  [17-19] 17 BP: (105-130)/(43-68) 110/68 MAP (mmHg):  [68-82] 82 SpO2:  [95 %-100 %] 97 %  Intake/Output last 3 shifts: I/O last 3 completed shifts: In: 2215 [P.O.:1090; I.V.:1125] Out: -  Intake/Output this shift: No intake/output data recorded.  Physical Exam: General appearance: alert, cooperative, and no distress Neck: no adenopathy, no carotid bruit, and no JVD Lungs: clear to auscultation bilaterally Heart: regular rate and rhythm, S1, S2 normal, no murmur, click, rub or gallop Abdomen: soft, non-tender; bowel sounds normal; no masses,  no organomegaly Extremities: extremities normal, atraumatic, no cyanosis or edema  Medications  Scheduled Meds: . ARIPiprazole   5 mg Oral Nightly  . clopidogreL   75 mg Oral Daily  . FLUoxetine   20 mg Oral QAM  . insulin  lispro  0-20 Units Subcutaneous ACHS  . metoprolol  tartrate  25 mg Oral BID  . pantoprazole   20 mg Oral Daily  . pravastatin  40 mg Oral Nightly   Continuous Infusions: . sodium chloride  100 mL/hr (04/03/21 0919)    Recent Lab Results    Lab Results  Component Value Date   WBC 8.8 04/02/2021   HGB 12.2 (L) 04/02/2021   HCT 36.4 04/02/2021   PLT 148 04/02/2021    Lab Results  Component Value Date   NA 135  04/02/2021   K 4.0 04/02/2021   CL 103 04/02/2021   CO2 24.8 04/02/2021   BUN 15 04/02/2021   CREATININE 0.94 04/02/2021   GLU 161 04/02/2021   CALCIUM  8.7 04/02/2021    Lab Results  Component Value Date   BILITOT 1.2 04/02/2021   PROT 6.6 04/02/2021   ALBUMIN 3.0 (L) 04/02/2021   ALT 23 04/02/2021   AST 16 04/02/2021   ALKPHOS 91 04/02/2021    Lab Results  Component Value Date   LABPROT 7.1 04/24/2018   INR 1.08 03/30/2021   APTT 22.1 03/30/2021     Pending Labs    No results found.  Chad JAYSON Fairly, MD

## 2021-04-04 DIAGNOSIS — Z89511 Acquired absence of right leg below knee: Secondary | ICD-10-CM | POA: Diagnosis not present

## 2021-04-04 DIAGNOSIS — L639 Alopecia areata, unspecified: Secondary | ICD-10-CM | POA: Diagnosis not present

## 2021-04-04 DIAGNOSIS — R531 Weakness: Secondary | ICD-10-CM | POA: Diagnosis not present

## 2021-04-04 DIAGNOSIS — E089 Diabetes mellitus due to underlying condition without complications: Secondary | ICD-10-CM | POA: Diagnosis not present

## 2021-04-05 ENCOUNTER — Telehealth (HOSPITAL_COMMUNITY): Payer: Self-pay | Admitting: *Deleted

## 2021-04-05 DIAGNOSIS — Z89511 Acquired absence of right leg below knee: Secondary | ICD-10-CM | POA: Diagnosis not present

## 2021-04-05 DIAGNOSIS — R1312 Dysphagia, oropharyngeal phase: Secondary | ICD-10-CM | POA: Diagnosis not present

## 2021-04-05 DIAGNOSIS — S88112S Complete traumatic amputation at level between knee and ankle, left lower leg, sequela: Secondary | ICD-10-CM | POA: Diagnosis not present

## 2021-04-05 DIAGNOSIS — F0390 Unspecified dementia without behavioral disturbance: Secondary | ICD-10-CM | POA: Diagnosis not present

## 2021-04-05 DIAGNOSIS — I69391 Dysphagia following cerebral infarction: Secondary | ICD-10-CM | POA: Diagnosis not present

## 2021-04-05 DIAGNOSIS — F39 Unspecified mood [affective] disorder: Secondary | ICD-10-CM | POA: Diagnosis not present

## 2021-04-05 DIAGNOSIS — F32A Depression, unspecified: Secondary | ICD-10-CM | POA: Diagnosis not present

## 2021-04-05 DIAGNOSIS — R41841 Cognitive communication deficit: Secondary | ICD-10-CM | POA: Diagnosis not present

## 2021-04-05 DIAGNOSIS — E7800 Pure hypercholesterolemia, unspecified: Secondary | ICD-10-CM | POA: Diagnosis not present

## 2021-04-05 DIAGNOSIS — R2689 Other abnormalities of gait and mobility: Secondary | ICD-10-CM | POA: Diagnosis not present

## 2021-04-05 DIAGNOSIS — E089 Diabetes mellitus due to underlying condition without complications: Secondary | ICD-10-CM | POA: Diagnosis not present

## 2021-04-05 DIAGNOSIS — L639 Alopecia areata, unspecified: Secondary | ICD-10-CM | POA: Diagnosis not present

## 2021-04-05 DIAGNOSIS — I69354 Hemiplegia and hemiparesis following cerebral infarction affecting left non-dominant side: Secondary | ICD-10-CM | POA: Diagnosis not present

## 2021-04-05 DIAGNOSIS — E1151 Type 2 diabetes mellitus with diabetic peripheral angiopathy without gangrene: Secondary | ICD-10-CM | POA: Diagnosis not present

## 2021-04-05 DIAGNOSIS — I10 Essential (primary) hypertension: Secondary | ICD-10-CM | POA: Diagnosis not present

## 2021-04-05 DIAGNOSIS — E119 Type 2 diabetes mellitus without complications: Secondary | ICD-10-CM | POA: Diagnosis not present

## 2021-04-05 DIAGNOSIS — M6281 Muscle weakness (generalized): Secondary | ICD-10-CM | POA: Diagnosis not present

## 2021-04-05 DIAGNOSIS — R531 Weakness: Secondary | ICD-10-CM | POA: Diagnosis not present

## 2021-04-05 DIAGNOSIS — G309 Alzheimer's disease, unspecified: Secondary | ICD-10-CM | POA: Diagnosis not present

## 2021-04-05 DIAGNOSIS — Z89512 Acquired absence of left leg below knee: Secondary | ICD-10-CM | POA: Diagnosis not present

## 2021-04-05 NOTE — Discharge Summary (Signed)
 [facility] Chad Summary         Admit date:    03/30/2021 Chad date:   04/05/2021 Length of stay:    LOS: 0 days     Chad Service:   [facility] Chad Attending Physician: Chad Grant Chad to:    To Skilled Nursing Facility Condition at Chad:  fair Code Status:    DNR and DNI  Chad Care Team: Chad Grant fairly, [name]  Consults       none  Chad Diagnoses  Active Problems:   Weakness   Hemiparesis affecting left side as late effect of stroke (CMS-HCC)   Diabetes (CMS-HCC)   Hx of BKA, left (CMS-HCC) Resolved Problems:   * No resolved Grant problems. [facility]   71 y.o.  male with a history of diabetes, left hemiparesis due to prior stroke and left BKA was brought in by family with a history of difficulty of swallowing and was noted to have slurred speech last night as the symptoms continued Chad was brought to the ER.  As per ER note Chad came yesterday to the ER and left without being seen.  While I am evaluating the Chad there is no family members and he seems to be awake alert. Chad was observed during the stay And his Accu-Cheks were done monitor blood sugar and blood pressure were monitored.  He received physical therapy and speech therapy modified barium swallow was also done during the visit.  He seems to be stable responding well at his baseline but remains weak.  He tends to feel himself has residual weakness from prior stroke Chad is going to nursing home for rehab his blood pressure today is 135/57 heart rate of 57 lungs were clear to auscultation cardiac was regular in rate and rhythm.  Residual weakness from prior stroke Chad will get physical therapy rehab will be discharged with medications noted below and to be followed at the nursing home.  I spent greater than 30 mins in the Chad of this Chad.  Procedures   none  Chad  Medications     Your Medication List    CONTINUE taking these medications   ARIPiprazole  5 MG tablet Commonly known as: ABILIFY  Take 1 tablet (5 mg total) by mouth nightly.   clopidogreL  75 mg tablet Commonly known as: PLAVIX  Take 1 tablet (75 mg total) by mouth daily.   FLUoxetine  20 MG capsule Commonly known as: PROzac  Take 1 capsule (20 mg total) by mouth every morning.   glipiZIDE 5 MG tablet Commonly known as: GLUCOTROL Take 1 tablet (5 mg total) by mouth daily.   metoprolol  tartrate 25 MG tablet Commonly known as: LOPRESSOR  Take 1 tablet (25 mg total) by mouth two (2) times a day.   ondansetron  4 MG tablet Commonly known as: ZOFRAN  Take 1 tablet (4 mg total) by mouth every eight (8) hours as needed for nausea.   pantoprazole  20 MG tablet Commonly known as: PROTONIX  Take 1 tablet (20 mg total) by mouth daily.   simvastatin  40 MG tablet Commonly known as: ZOCOR  Take 5 mg by mouth daily with evening meal.   valbenazine  40 mg Cap Take 1 capsule (40 mg total) by mouth daily.       Pending Test Results   none   Lab Results    BLOOD Recent Labs  Lab Units 04/04/21 1850 04/02/21 0409 04/01/21 0507 03/31/21 0640 03/30/21 1332  WBC 10*9/L 6.7  8.8 10.8* 15.2* 18.0*  HEMOGLOBIN g/dL 87.3 87.7* 87.0 86.0 86.2  HEMATOCRIT % 37.3 36.4 38.5 41.8 40.9  PLATELET COUNT (1) 10*9/L 140 148 151 179 185   Recent Labs  Lab Units 04/04/21 1850 04/02/21 0409 04/01/21 0508 03/30/21 1332  SODIUM mmol/L 137 135 134* 137  POTASSIUM mmol/L 3.9 4.0 3.9 3.7  CHLORIDE mmol/L 102 103 101 98  CO2 mmol/L 28.6 24.8 24.1 29.5  BUN mg/dL 12 15 15  28*  CREATININE mg/dL 8.95 9.05 9.11 8.77  GLUCOSE mg/dL 799* 838 818* 777*  CALCIUM  mg/dL 8.9 8.7 8.8 9.4  ALBUMIN g/dL 3.0* 3.0* 3.3* 4.1  PROTEIN TOTAL g/dL 6.6 6.6 7.6 8.8*  BILIRUBIN TOTAL mg/dL 0.4 1.2 1.2 1.7*  AST U/L 15 16 19 21   ALT U/L 21 23 24 29   ALK PHOS U/L 90 91 105 122*   Recent Labs  Lab Units  03/30/21 1332  HSTNI ng/L 10   Recent Labs  Lab Units 03/30/21 1332  INR  1.08  APTT sec 22.1   No results in the last week  No results in the last week  URINE Recent Labs  Lab Units 04/05/21 0413 03/30/21 1654  WBC UA /HPF 1 0  NITRITE UA  Negative Negative  LEUKOCYTES UA  Negative Negative  BACTERIA UA /HPF Occasional  --   RBC UA /HPF 0 2  BLOOD UA  Negative Trace*  GLUCOSE UA  Negative 100 mg/dL*  PROTEIN UA  Negative 30 mg/dL*  KETONES UA  Negative 15 mg/dL*   No results in the last week  BODY FLUIDS No results in the last week  ABG No results for input(s): O2SOUR, FIO2ART, PHART, PCO2ART, PO2ART, HCO3ART, O2SATART, BEART in the last 72 hours.  Microbiology Results (last day)    ** No results found for the last 24 hours. **      Imaging   ECG 12 Lead  Result Date: 04/05/2021 Sinus bradycardia with marked sinus arrhythmia Otherwise normal ECG No previous ECGs available Confirmed by Chad Grant ([id number]) on 04/05/2021 6:12:29 AM  XR Chest Portable  Result Date: 03/30/2021 CLINICAL DATA:  weakness ; OTHER Pt family reports left sided weakness, difficulty swallowing since 1900 yesterday. Hx TIA. EXAM: PORTABLE CHEST 1 VIEW COMPARISON:  None. FINDINGS: The heart and mediastinal contours are within normal limits. Aortic calcification. Low lung volumes. No focal consolidation. No pulmonary edema. No pleural effusion. No pneumothorax. No acute osseous abnormality.   1. No active disease. 2.  Aortic Atherosclerosis (ICD10-I70.0). Electronically Signed   By: Chad  Grant M.D.   On: 03/30/2021 15:58   CT Head Wo Contrast  Result Date: 03/30/2021 CLINICAL DATA:  Slurred speech, difficulty swallowing beginning last night, left facial droop, left weakness EXAM: CT HEAD WITHOUT CONTRAST TECHNIQUE: Contiguous axial images were obtained from the base of the skull through the vertex without intravenous contrast. RADIATION DOSE REDUCTION: This exam was performed according to  the departmental dose-optimization program which includes automated exposure control, adjustment of the mA and/or kV according to Chad size and/or use of iterative reconstruction technique. COMPARISON:  Reports from multiple prior head CTs are available most recently 04/23/2018, but these images are not retrievable at the time of initial dictation. FINDINGS: Brain: Parenchymal volume is normal for age. There are multiple lacunar infarcts in the bilateral basal ganglia, thalami, and caudate nuclei consistent with age-indeterminate infarcts. Confluent hypodensity in the right frontal lobe periventricular white matter is also noted. There is ex vacuo dilatation of the right lateral ventricle  suggesting that at least some of these infarcts are remote There is no evidence of acute intracranial hemorrhage or extra-axial fluid collection. The ventricles are otherwise normal in size. There is no mass lesion. There is no mass effect or midline shift. Vascular: There is calcification of the bilateral cavernous ICAs. Skull: Normal. Negative for fracture or focal lesion. Sinuses/Orbits: The paranasal sinuses are clear. Bilateral lens implants are in place. The globes and orbits are otherwise unremarkable. Other: None.   1. Multiple lacunar infarcts in the bilateral basal ganglia are all likely remote, though acute ischemia can not be entirely excluded (prior images are not available for direct comparison at the time of dictation). There is no large vessel territorial infarct. 2. No acute intracranial hemorrhage or extra-axial fluid collection. These results were called by telephone at the time of interpretation on 03/30/2021 at 1:38 pm to provider [facility] , who verbally acknowledged these results. Electronically Signed   By: Maude Harry M.D.   On: 03/30/2021 13:42   MRA Head Wo Contrast  Result Date: 03/30/2021 CLINICAL DATA:  CVA; Neuro deficit, acute, stroke suspected EXAM: MRI HEAD WITHOUT CONTRAST MRA HEAD  WITHOUT CONTRAST TECHNIQUE: Multiplanar, multi-echo pulse sequences of the brain and surrounding structures were acquired without intravenous contrast. Angiographic images of the Circle of Willis were acquired using MRA technique without intravenous contrast. COMPARISON:  None FINDINGS: MRI HEAD Brain: There is no acute infarction or intracranial hemorrhage. There are multiple chronic small vessel infarcts involving the basal ganglia, thalamus, and central white matter bilaterally. There is wallerian degeneration along the right cerebral peduncle. Additional small chronic infarcts of the right pons and bilateral cerebellum. Other patchy and confluent areas of T2 hyperintensity in the supratentorial white matter are nonspecific but probably reflect chronic microvascular ischemic changes. There is no intracranial mass, mass effect, or edema. There is no hydrocephalus or extra-axial fluid collection. Vascular: Major vessel flow voids at the skull base are preserved. Skull and upper cervical spine: Normal marrow signal is preserved. Sinuses/Orbits: Paranasal sinuses are aerated. Bilateral lens replacements. Other: Sella is unremarkable.  Mastoid air cells are clear. MRA HEAD Degree of stenosis difficult to assess due to motion degradation. Intracranial internal carotid arteries are patent with atherosclerotic irregularity. There is moderate to marked stenosis of the supraclinoid left ICA. Middle and anterior cerebral arteries are patent with atherosclerotic irregularity. Intracranial right vertebral artery is patent. There is diminished flow related enhancement of the proximal intracranial left vertebral artery. The visualized extracranial portion appears patent. Improved flow related enhancement at the PICA origin, which could reflect retrograde flow. Basilar artery is patent. Posterior cerebral arteries are patent with atherosclerotic irregularity. No aneurysm.   No acute infarction, hemorrhage, or mass. Chronic  microvascular ischemic changes with multiple chronic small vessel infarcts. Suboptimal vascular evaluation due to motion degradation. There is moderate to marked stenosis of the supraclinoid left ICA. Possible stenosis or occlusion of the proximal intracranial left vertebral artery with flow beyond the PICA origin that may be retrograde. Atherosclerotic irregularity of ACA, MCA, PCA large and medium vessel branches. Electronically Signed   By: Santina Blanch M.D.   On: 03/30/2021 15:25   MRI brain without contrast  Result Date: 03/30/2021 CLINICAL DATA:  CVA; Neuro deficit, acute, stroke suspected EXAM: MRI HEAD WITHOUT CONTRAST MRA HEAD WITHOUT CONTRAST TECHNIQUE: Multiplanar, multi-echo pulse sequences of the brain and surrounding structures were acquired without intravenous contrast. Angiographic images of the Circle of Willis were acquired using MRA technique without intravenous contrast. COMPARISON:  None FINDINGS: MRI HEAD Brain: There is no acute infarction or intracranial hemorrhage. There are multiple chronic small vessel infarcts involving the basal ganglia, thalamus, and central white matter bilaterally. There is wallerian degeneration along the right cerebral peduncle. Additional small chronic infarcts of the right pons and bilateral cerebellum. Other patchy and confluent areas of T2 hyperintensity in the supratentorial white matter are nonspecific but probably reflect chronic microvascular ischemic changes. There is no intracranial mass, mass effect, or edema. There is no hydrocephalus or extra-axial fluid collection. Vascular: Major vessel flow voids at the skull base are preserved. Skull and upper cervical spine: Normal marrow signal is preserved. Sinuses/Orbits: Paranasal sinuses are aerated. Bilateral lens replacements. Other: Sella is unremarkable.  Mastoid air cells are clear. MRA HEAD Degree of stenosis difficult to assess due to motion degradation. Intracranial internal carotid arteries are  patent with atherosclerotic irregularity. There is moderate to marked stenosis of the supraclinoid left ICA. Middle and anterior cerebral arteries are patent with atherosclerotic irregularity. Intracranial right vertebral artery is patent. There is diminished flow related enhancement of the proximal intracranial left vertebral artery. The visualized extracranial portion appears patent. Improved flow related enhancement at the PICA origin, which could reflect retrograde flow. Basilar artery is patent. Posterior cerebral arteries are patent with atherosclerotic irregularity. No aneurysm.   No acute infarction, hemorrhage, or mass. Chronic microvascular ischemic changes with multiple chronic small vessel infarcts. Suboptimal vascular evaluation due to motion degradation. There is moderate to marked stenosis of the supraclinoid left ICA. Possible stenosis or occlusion of the proximal intracranial left vertebral artery with flow beyond the PICA origin that may be retrograde. Atherosclerotic irregularity of ACA, MCA, PCA large and medium vessel branches. Electronically Signed   By: Santina Blanch M.D.   On: 03/30/2021 15:25    Chad Instructions       Chad Grant Fairly, Chad Grant

## 2021-04-05 NOTE — Telephone Encounter (Signed)
Called patient to schedule appt and male picked up and stated patient is in the hosiptal. Informed her to have patient call office back to resch appt when he gets better enough to sch appt and complete appt.  ?

## 2021-04-05 NOTE — Nursing Note (Signed)
 ------------------------------------------------------------------------------- Attestation signed by Gallardo, Rachel K, RD/LDN at 04/08/21 1517 I attest that I have reviewed the Provisional Licensed Dietitian Nutritionist's (PLDN) note and that the components of the nutrition assessment (including nutrition-focused physical exam), diagnosis, intervention, monitoring/evaluation criteria, and the assessment and plan documented were performed by me or were performed in my presence by the [facility] where I verified the documentation and performed (or re-performed) the exam and nutrition-focused decision making.   Vernell MARLA Merry, MS, RDN, LDN Clinical Dietitian 04/08/21, 3:17 PM    -------------------------------------------------------------------------------  Adult Nutrition Assessment  Note Type: Assessment Visit Type: RD Risk Alert Reason for Visit: Assessment (Nutrition), PO Intake, Malnutrition   ASSESSMENT: HPI & PMH:  Active Problems:   Weakness   Hemiparesis affecting left side as late effect of stroke (CMS-HCC)   Diabetes (CMS-HCC)   Hx of BKA, left (CMS-HCC)  Past Medical History:  Diagnosis Date  . Carotid artery disease (CMS-HCC)   . Dementia (CMS-HCC)   . Diabetes mellitus (CMS-HCC)   . Hypercholesterolemia   . Hypertension   . Stroke (CMS-HCC)      Subjective: (04/05/21) per SLP notes, pt did not receive boost pudding at breakfast time. Pt expresses hunger and is self feeding with limited assistance. As Glucerna is not within fluid consistency restrictions recommend discontinuing and proceeding with just the boost pudding.   (04/02/21) Patient admitted for weakness, hemiparesis affecting L side as late effect of stroke. L BKA performed 2009, adjusted BMI 22.1 - still low for age 51+. Remains hypotensive, unable to be seen by SLP.   Objective Information: Oxygen: Oxygen Therapy SpO2: 98 % O2 Device: None (Room air) Bladder Status: Urine Output & Assessment Urine  Occurrence: 2 Urinary Incontinence: Yes Urine Amount: Large Urine Color: Yellow/straw Urine Appearance: Unable to assess Urine Odor: No odor Urinary Elimination: Voiding, Incontinence Abdominal/Bowel Status:  not documented Last BM date:  prior to admission Edema: Peripheral Vascular Peripheral Vascular (WDL): Exceptions to WDL Cyanosis: None Capillary Refill (all extremities): Less than/equal to 3 seconds (All extremities) Pulses: R radial, L radial, R dorsalis pedis, L dorsalis pedis (Lt bka) Extremity Circumference:  (left BKA) Wounds/Skin:  R pretibial scabbed over areas Pain Assessment: Pain Assessment 0-10 Pain Scale: 0  Nutrition Hx:  Allergies: No Known Allergies  Current nutrition therapy order:   Nutrition Orders        Nutrition Therapy Consistent Carb; Consistent Carb 60/60/60 (4/4/4); Mildly Thick, Level 2; Minced & Moist, Level 5 starting at 03/14 1024   Supplement Adult; Glucerna Shake (Carbohydrate Control); # of Products PER Serving: 1 2xd Meals starting at 03/13 1700       Average percent meals eaten: 100% x 3 meals PO intake meeting: 100% needs  Meds:  Scheduled Meds: . ARIPiprazole   5 mg Oral Nightly  . clopidogreL   75 mg Oral Daily  . FLUoxetine   20 mg Oral QAM  . insulin  lispro  0-20 Units Subcutaneous ACHS  . metoprolol  tartrate  25 mg Oral BID  . pantoprazole   20 mg Oral Daily  . pravastatin  40 mg Oral Nightly   Continuous Infusions: none  PRN Meds:.dextrose , dextrose  in water, glucagon, ondansetron , prochlorperazine    Labs: Lab Results  Component Value Date   NA 137 04/04/2021   K 3.9 04/04/2021   CL 102 04/04/2021   CO2 28.6 04/04/2021   BUN 12 04/04/2021   CREATININE 1.04 04/04/2021   GLU 200 (H) 04/04/2021   CALCIUM  8.9 04/04/2021   Corrected Ca2+: 9.7 mg/dL Normal POC Glucose:  124 mg/dL High  Lab Results  Component Value Date   BILITOT 0.4 04/04/2021   PROT 6.6 04/04/2021   ALBUMIN 3.0 (L) 04/04/2021   ALT 21 04/04/2021    AST 15 04/04/2021   ALKPHOS 90 04/04/2021    Anthropometric Data: -- Height: 175.3 cm (5' 9)  -- Last recorded weight: 64.4 kg (142 lb) 03/30/2021 -- Admission weight: 64.4 kg -- IBW: 72.64 kg -- BMI: Body mass index is 20.97 kg/m. (Underweight) for age 32+, also see adjusted BMI above  Wt Readings from Last 10 Encounters:  03/30/21 64.4 kg (142 lb)  03/28/21 68.5 kg (151 lb)  04/23/18 64.5 kg (142 lb 2.8 oz)  06/09/17 69 kg (152 lb 3.6 oz)  05/18/15 75.3 kg (165 lb 15.8 oz)  01/31/15 68.9 kg (151 lb 15.8 oz)  09/28/13 66.4 kg (146 lb 4.8 oz)   Weight history: no significant changes documented  Nutrition-Focused Physical Findings:   -- Muscle Areas Assessed: temple and clavicle ---- Findings: no loss noted -- Fat Areas Assessed: orbital ---- Findings: no loss noted   Malnutrition Assessment using AND/ASPEN Clinical Characteristics:  Patient does not meet AND/ASPEN criteria for malnutrition at this time (04/02/21 1358)             assessment on 04/02/2021  Estimated Nutrient Needs: Estimated Energy Needs (per actual body weight  of 64 kg): Energy: 22 - 25 Kcal/kg = 1408 - 1600 Kcal/day   Estimated Protein Needs (per actual body weight  of 64 kg): Protein: 1.2 - 1.5 g/kg = 77 - 96 g/day Fluid: 1 ml/kcal maintenance needs    DIAGNOSIS: Inadequate Oral Intake as related to decreased ability to consume sufficient energy as evidenced by estimates of poor PO intake.   INTERVENTION: 1. Discontinue Glucerna BID to provide 330 kcal and 10 g pro per 8 oz serving 2. Continue consistent CHO diet consistency per SLP 3. Recommend Boost pudding daily providing 230 kcal, 7 g protein per container   Goal: Patient to consume greater than 75 % of daily meals and 50 % of daily supplements. Goal Progress: goal met, monitor in 3-5 days  MONITORING/EVALUATION: RD to monitor PO intake, supplement intake, labs, blood glucose, RX, weight, wound healing.  Discharge Plan: patient  to continue texturally appropriate, diabetic diet  Damien LOISE Louder, [place] Clinical Nutrition 04/05/21, 10:18 AM

## 2021-04-05 NOTE — Progress Notes (Signed)
 Billing Information   [name], MD   Patient: Chad Grant male   Date of Birth: Dec 21, 1950   Status: Observation   Length of Stay: 0  Admit Date: 03/30/2021 1533   Discharge Date: No discharge date for patient encounter.   Problem List Active Problems:   Weakness   Hemiparesis affecting left side as late effect of stroke (CMS-HCC)   Diabetes (CMS-HCC)   Hx of BKA, left (CMS-HCC) Resolved Problems:   * No resolved hospital problems. *    Diagnosis:  Diagnosis ICD-10-CM Associated Orders  1. Cerebrovascular accident (CVA), unspecified mechanism (CMS-HCC)  I63.9     2. Weakness  R53.1

## 2021-04-10 DIAGNOSIS — E1151 Type 2 diabetes mellitus with diabetic peripheral angiopathy without gangrene: Secondary | ICD-10-CM | POA: Diagnosis not present

## 2021-04-10 DIAGNOSIS — G309 Alzheimer's disease, unspecified: Secondary | ICD-10-CM | POA: Diagnosis not present

## 2021-04-10 DIAGNOSIS — S88112S Complete traumatic amputation at level between knee and ankle, left lower leg, sequela: Secondary | ICD-10-CM | POA: Diagnosis not present

## 2021-04-10 DIAGNOSIS — I69354 Hemiplegia and hemiparesis following cerebral infarction affecting left non-dominant side: Secondary | ICD-10-CM | POA: Diagnosis not present

## 2021-04-17 NOTE — Progress Notes (Deleted)
? ? ? ?GI Office Note   ? ?Referring Provider: Monico Blitz, MD ?Primary Care Physician:  Monico Blitz, MD  ?Primary Gastroenterologist: Garfield Cornea, MD ? ? ?Chief Complaint  ? ?No chief complaint on file. ? ? ? ?History of Present Illness  ? ?Chad Grant is a 71 y.o. male presenting today   ?Patient last seen in December 2020, colonoscopy was scheduled at that time but patient had a stroke several weeks prior to procedure and procedure was canceled. ? ? ? ?Medications  ? ?Current Outpatient Medications  ?Medication Sig Dispense Refill  ? ARIPiprazole (ABILIFY) 5 MG tablet Take 1 tablet (5 mg total) by mouth at bedtime. 90 tablet 2  ? clopidogrel (PLAVIX) 75 MG tablet Take 1 tablet (75 mg total) by mouth daily.    ? FLUoxetine (PROZAC) 20 MG capsule Take 1 capsule (20 mg total) by mouth every morning. 90 capsule 2  ? glipiZIDE (GLUCOTROL) 5 MG tablet Take 5 mg by mouth daily.    ? lipase/protease/amylase (CREON) 36000 UNITS CPEP capsule Take 2 capsules (72,000 Units total) by mouth 3 (three) times daily with meals. 1 capsule with snacks 240 capsule 3  ? loperamide (IMODIUM) 2 MG capsule Take by mouth as needed for diarrhea or loose stools.    ? Melatonin 5 MG CAPS Take 5 mg by mouth at bedtime as needed (sleep).    ? metoprolol tartrate (LOPRESSOR) 25 MG tablet Take 1 tablet (25 mg total) by mouth 2 (two) times daily. (Patient taking differently: Take 25 mg by mouth daily. )    ? polyethylene glycol-electrolytes (TRILYTE) 420 g solution Take 4,000 mLs by mouth as directed. 4000 mL 0  ? simvastatin (ZOCOR) 40 MG tablet Take 40 mg by mouth every evening.    ? valbenazine (INGREZZA) 40 MG capsule Take 1 capsule (40 mg total) by mouth daily. Dx: G24.01 30 capsule 2  ? ?No current facility-administered medications for this visit.  ? ? ?Allergies  ? ?Allergies as of 04/18/2021  ? (No Known Allergies)  ? ? ?Past Medical History  ? ?Past Medical History:  ?Diagnosis Date  ? Dementia ([place])   ? Depression   ? Diabetes  mellitus type II   ? Hypercholesteremia   ? Hypertension   ? S/P BKA (below knee amputation) unilateral ([place])   ? Stroke [facility]) 2013  ? left sided weakness  ? TIA (transient ischemic attack)   ? ? ?Past Surgical History  ? ?Past Surgical History:  ?Procedure Laterality Date  ? ABDOMINAL AORTAGRAM N/A 04/29/2012  ? Procedure: ABDOMINAL AORTAGRAM;  Surgeon: Serafina Mitchell, MD;  Location: [facility] CATH LAB;  Service: Cardiovascular;  Laterality: N/A;  ? AMPUTATION Right 05/15/2018  ? Procedure: AMPUTATION RIGHT THIRD TOE;  Surgeon: Caprice Beaver, DPM;  Location: [facility] ORS;  Service: Podiatry;  Laterality: Right;  ? CATARACT EXTRACTION W/PHACO Left 12/04/2015  ? Procedure: CATARACT EXTRACTION PHACO AND INTRAOCULAR LENS PLACEMENT LEFT EYE CDE=19.92;  Surgeon: Tonny Branch, MD;  Location: [facility] ORS;  Service: Ophthalmology;  Laterality: Left;  left  ? cataract removed Bilateral   ? left leg amputa    ? BKA  ? left leg amputated    ? SHOULDER SURGERY Right   ? rotator cuff  ? ? ?Past Family History  ? ?Family History  ?Problem Relation Age of Onset  ? Heart disease Mother   ? Diabetes Father   ? Alcohol abuse Brother   ? Diabetes Brother   ? Diabetes Sister   ?  Peripheral vascular disease Sister   ?     amputation  ? Diabetes Daughter   ? Anxiety disorder Neg Hx   ? Bipolar disorder Neg Hx   ? Dementia Neg Hx   ? Depression Neg Hx   ? Drug abuse Neg Hx   ? Paranoid behavior Neg Hx   ? Schizophrenia Neg Hx   ? OCD Neg Hx   ? Seizures Neg Hx   ? Sexual abuse Neg Hx   ? Physical abuse Neg Hx   ? ADD / ADHD Neg Hx   ? Colon cancer Neg Hx   ? Colon polyps Neg Hx   ? ? ?Past Social History  ? ?Social History  ? ?Socioeconomic History  ? Marital status: Married  ?  Spouse name: Not on file  ? Number of children: Not on file  ? Years of education: Not on file  ? Highest education level: Not on file  ?Occupational History  ? Not on file  ?Tobacco Use  ? Smoking status: Never  ? Smokeless tobacco: Never  ?Vaping Use  ? Vaping Use: Never used   ?Substance and Sexual Activity  ? Alcohol use: No  ?  Comment: in the past but not since amputation in 2009, drank alcohol on weekends   ? Drug use: No  ? Sexual activity: Not Currently  ?Other Topics Concern  ? Not on file  ?Social History Narrative  ? Not on file  ? ?Social Determinants of Health  ? ?Financial Resource Strain: Not on file  ?Food Insecurity: Not on file  ?Transportation Needs: Not on file  ?Physical Activity: Not on file  ?Stress: Not on file  ?Social Connections: Not on file  ?Intimate Partner Violence: Not on file  ? ? ?Review of Systems  ? ?General: Negative for anorexia, weight loss, fever, chills, fatigue, weakness. ?Eyes: Negative for vision changes.  ?ENT: Negative for hoarseness, difficulty swallowing , nasal congestion. ?CV: Negative for chest pain, angina, palpitations, dyspnea on exertion, peripheral edema.  ?Respiratory: Negative for dyspnea at rest, dyspnea on exertion, cough, sputum, wheezing.  ?GI: See history of present illness. ?GU:  Negative for dysuria, hematuria, urinary incontinence, urinary frequency, nocturnal urination.  ?MS: Negative for joint pain, low back pain.  ?Derm: Negative for rash or itching.  ?Neuro: Negative for weakness, abnormal sensation, seizure, frequent headaches, memory loss,  ?confusion.  ?Psych: Negative for anxiety, depression, suicidal ideation, hallucinations.  ?Endo: Negative for unusual weight change.  ?Heme: Negative for bruising or bleeding. ?Allergy: Negative for rash or hives. ? ?Physical Exam  ? ?There were no vitals taken for this visit. ?  ?General: Well-nourished, well-developed in no acute distress.  ?Head: Normocephalic, atraumatic.   ?Eyes: Conjunctiva pink, no icterus. ?Mouth: Oropharyngeal mucosa moist and pink , no lesions erythema or exudate. ?Neck: Supple without thyromegaly, masses, or lymphadenopathy.  ?Lungs: Clear to auscultation bilaterally.  ?Heart: Regular rate and rhythm, no murmurs rubs or gallops.  ?Abdomen: Bowel sounds  are normal, nontender, nondistended, no hepatosplenomegaly or masses,  ?no abdominal bruits or hernia, no rebound or guarding.   ?Rectal: *** ?Extremities: No lower extremity edema. No clubbing or deformities.  ?Neuro: Alert and oriented x 4 , grossly normal neurologically.  ?Skin: Warm and dry, no rash or jaundice.   ?Psych: Alert and cooperative, normal mood and affect. ? ?Labs  ? ?March 2023: White blood cell count 6700, hemoglobin 12.6, platelets 140,000.  Sodium 137, potassium 3.9, BUN 12, creatinine 1.04, glucose 200, albumin 3, total  bilirubin 0.4, alk phos 90, AST 15, ALT 21. ?Imaging Studies  ? ?No results found. ? ?Assessment  ? ?  ? ? ?PLAN  ? ?*** ? ? ?Laureen Ochs. Ceil Roderick, MHS, PA-C ?[facility] ? ?

## 2021-04-18 ENCOUNTER — Ambulatory Visit: Payer: PPO | Admitting: Gastroenterology

## 2021-04-18 ENCOUNTER — Encounter: Payer: Self-pay | Admitting: Internal Medicine

## 2021-04-19 DIAGNOSIS — E7800 Pure hypercholesterolemia, unspecified: Secondary | ICD-10-CM | POA: Diagnosis not present

## 2021-04-23 DIAGNOSIS — I69391 Dysphagia following cerebral infarction: Secondary | ICD-10-CM | POA: Diagnosis not present

## 2021-04-23 DIAGNOSIS — I69354 Hemiplegia and hemiparesis following cerebral infarction affecting left non-dominant side: Secondary | ICD-10-CM | POA: Diagnosis not present

## 2021-04-24 DIAGNOSIS — Z89512 Acquired absence of left leg below knee: Secondary | ICD-10-CM | POA: Diagnosis not present

## 2021-04-24 DIAGNOSIS — F0283 Dementia in other diseases classified elsewhere, unspecified severity, with mood disturbance: Secondary | ICD-10-CM | POA: Diagnosis not present

## 2021-04-24 DIAGNOSIS — R1310 Dysphagia, unspecified: Secondary | ICD-10-CM | POA: Diagnosis not present

## 2021-04-24 DIAGNOSIS — D6869 Other thrombophilia: Secondary | ICD-10-CM | POA: Diagnosis not present

## 2021-04-24 DIAGNOSIS — I6529 Occlusion and stenosis of unspecified carotid artery: Secondary | ICD-10-CM | POA: Diagnosis not present

## 2021-04-24 DIAGNOSIS — I10 Essential (primary) hypertension: Secondary | ICD-10-CM | POA: Diagnosis not present

## 2021-04-24 DIAGNOSIS — Z7984 Long term (current) use of oral hypoglycemic drugs: Secondary | ICD-10-CM | POA: Diagnosis not present

## 2021-04-24 DIAGNOSIS — I700 Atherosclerosis of aorta: Secondary | ICD-10-CM | POA: Diagnosis not present

## 2021-04-24 DIAGNOSIS — Z7902 Long term (current) use of antithrombotics/antiplatelets: Secondary | ICD-10-CM | POA: Diagnosis not present

## 2021-04-24 DIAGNOSIS — K219 Gastro-esophageal reflux disease without esophagitis: Secondary | ICD-10-CM | POA: Diagnosis not present

## 2021-04-24 DIAGNOSIS — F39 Unspecified mood [affective] disorder: Secondary | ICD-10-CM | POA: Diagnosis not present

## 2021-04-24 DIAGNOSIS — Z91148 Patient's other noncompliance with medication regimen for other reason: Secondary | ICD-10-CM | POA: Diagnosis not present

## 2021-04-24 DIAGNOSIS — E1165 Type 2 diabetes mellitus with hyperglycemia: Secondary | ICD-10-CM | POA: Diagnosis not present

## 2021-04-24 DIAGNOSIS — G4700 Insomnia, unspecified: Secondary | ICD-10-CM | POA: Diagnosis not present

## 2021-04-24 DIAGNOSIS — G249 Dystonia, unspecified: Secondary | ICD-10-CM | POA: Diagnosis not present

## 2021-04-24 DIAGNOSIS — E7800 Pure hypercholesterolemia, unspecified: Secondary | ICD-10-CM | POA: Diagnosis not present

## 2021-04-24 DIAGNOSIS — E1151 Type 2 diabetes mellitus with diabetic peripheral angiopathy without gangrene: Secondary | ICD-10-CM | POA: Diagnosis not present

## 2021-04-24 DIAGNOSIS — R1312 Dysphagia, oropharyngeal phase: Secondary | ICD-10-CM | POA: Diagnosis not present

## 2021-04-24 DIAGNOSIS — I69354 Hemiplegia and hemiparesis following cerebral infarction affecting left non-dominant side: Secondary | ICD-10-CM | POA: Diagnosis not present

## 2021-04-24 DIAGNOSIS — M545 Low back pain, unspecified: Secondary | ICD-10-CM | POA: Diagnosis not present

## 2021-04-24 DIAGNOSIS — M1990 Unspecified osteoarthritis, unspecified site: Secondary | ICD-10-CM | POA: Diagnosis not present

## 2021-04-24 DIAGNOSIS — F1010 Alcohol abuse, uncomplicated: Secondary | ICD-10-CM | POA: Diagnosis not present

## 2021-04-24 DIAGNOSIS — F32A Depression, unspecified: Secondary | ICD-10-CM | POA: Diagnosis not present

## 2021-04-24 DIAGNOSIS — G309 Alzheimer's disease, unspecified: Secondary | ICD-10-CM | POA: Diagnosis not present

## 2021-04-30 DIAGNOSIS — K219 Gastro-esophageal reflux disease without esophagitis: Secondary | ICD-10-CM | POA: Diagnosis not present

## 2021-04-30 DIAGNOSIS — I10 Essential (primary) hypertension: Secondary | ICD-10-CM | POA: Diagnosis not present

## 2021-04-30 DIAGNOSIS — I69354 Hemiplegia and hemiparesis following cerebral infarction affecting left non-dominant side: Secondary | ICD-10-CM | POA: Diagnosis not present

## 2021-04-30 DIAGNOSIS — F0280 Dementia in other diseases classified elsewhere without behavioral disturbance: Secondary | ICD-10-CM | POA: Diagnosis not present

## 2021-04-30 DIAGNOSIS — G309 Alzheimer's disease, unspecified: Secondary | ICD-10-CM | POA: Diagnosis not present

## 2021-04-30 DIAGNOSIS — Z299 Encounter for prophylactic measures, unspecified: Secondary | ICD-10-CM | POA: Diagnosis not present

## 2021-04-30 DIAGNOSIS — Z6822 Body mass index (BMI) 22.0-22.9, adult: Secondary | ICD-10-CM | POA: Diagnosis not present

## 2021-05-08 DIAGNOSIS — Z7984 Long term (current) use of oral hypoglycemic drugs: Secondary | ICD-10-CM | POA: Diagnosis not present

## 2021-05-08 DIAGNOSIS — Z89512 Acquired absence of left leg below knee: Secondary | ICD-10-CM | POA: Diagnosis not present

## 2021-05-08 DIAGNOSIS — I69354 Hemiplegia and hemiparesis following cerebral infarction affecting left non-dominant side: Secondary | ICD-10-CM | POA: Diagnosis not present

## 2021-05-08 DIAGNOSIS — E785 Hyperlipidemia, unspecified: Secondary | ICD-10-CM | POA: Diagnosis not present

## 2021-05-08 DIAGNOSIS — F330 Major depressive disorder, recurrent, mild: Secondary | ICD-10-CM | POA: Diagnosis not present

## 2021-05-08 DIAGNOSIS — Z9989 Dependence on other enabling machines and devices: Secondary | ICD-10-CM | POA: Diagnosis not present

## 2021-05-08 DIAGNOSIS — E1169 Type 2 diabetes mellitus with other specified complication: Secondary | ICD-10-CM | POA: Diagnosis not present

## 2021-05-16 DIAGNOSIS — E1165 Type 2 diabetes mellitus with hyperglycemia: Secondary | ICD-10-CM | POA: Diagnosis not present

## 2021-05-18 DIAGNOSIS — F1010 Alcohol abuse, uncomplicated: Secondary | ICD-10-CM | POA: Diagnosis not present

## 2021-05-18 DIAGNOSIS — Z7902 Long term (current) use of antithrombotics/antiplatelets: Secondary | ICD-10-CM | POA: Diagnosis not present

## 2021-05-18 DIAGNOSIS — F0283 Dementia in other diseases classified elsewhere, unspecified severity, with mood disturbance: Secondary | ICD-10-CM | POA: Diagnosis not present

## 2021-05-18 DIAGNOSIS — Z7984 Long term (current) use of oral hypoglycemic drugs: Secondary | ICD-10-CM | POA: Diagnosis not present

## 2021-05-18 DIAGNOSIS — I700 Atherosclerosis of aorta: Secondary | ICD-10-CM | POA: Diagnosis not present

## 2021-05-18 DIAGNOSIS — G4700 Insomnia, unspecified: Secondary | ICD-10-CM | POA: Diagnosis not present

## 2021-05-18 DIAGNOSIS — E1165 Type 2 diabetes mellitus with hyperglycemia: Secondary | ICD-10-CM | POA: Diagnosis not present

## 2021-05-18 DIAGNOSIS — R1312 Dysphagia, oropharyngeal phase: Secondary | ICD-10-CM | POA: Diagnosis not present

## 2021-05-18 DIAGNOSIS — E1151 Type 2 diabetes mellitus with diabetic peripheral angiopathy without gangrene: Secondary | ICD-10-CM | POA: Diagnosis not present

## 2021-05-18 DIAGNOSIS — M545 Low back pain, unspecified: Secondary | ICD-10-CM | POA: Diagnosis not present

## 2021-05-18 DIAGNOSIS — K219 Gastro-esophageal reflux disease without esophagitis: Secondary | ICD-10-CM | POA: Diagnosis not present

## 2021-05-18 DIAGNOSIS — M1990 Unspecified osteoarthritis, unspecified site: Secondary | ICD-10-CM | POA: Diagnosis not present

## 2021-05-18 DIAGNOSIS — F39 Unspecified mood [affective] disorder: Secondary | ICD-10-CM | POA: Diagnosis not present

## 2021-05-18 DIAGNOSIS — G309 Alzheimer's disease, unspecified: Secondary | ICD-10-CM | POA: Diagnosis not present

## 2021-05-18 DIAGNOSIS — I6529 Occlusion and stenosis of unspecified carotid artery: Secondary | ICD-10-CM | POA: Diagnosis not present

## 2021-05-18 DIAGNOSIS — G249 Dystonia, unspecified: Secondary | ICD-10-CM | POA: Diagnosis not present

## 2021-05-18 DIAGNOSIS — Z91148 Patient's other noncompliance with medication regimen for other reason: Secondary | ICD-10-CM | POA: Diagnosis not present

## 2021-05-18 DIAGNOSIS — Z89512 Acquired absence of left leg below knee: Secondary | ICD-10-CM | POA: Diagnosis not present

## 2021-05-18 DIAGNOSIS — R1310 Dysphagia, unspecified: Secondary | ICD-10-CM | POA: Diagnosis not present

## 2021-05-18 DIAGNOSIS — I10 Essential (primary) hypertension: Secondary | ICD-10-CM | POA: Diagnosis not present

## 2021-05-18 DIAGNOSIS — D6869 Other thrombophilia: Secondary | ICD-10-CM | POA: Diagnosis not present

## 2021-05-18 DIAGNOSIS — E7800 Pure hypercholesterolemia, unspecified: Secondary | ICD-10-CM | POA: Diagnosis not present

## 2021-05-18 DIAGNOSIS — F32A Depression, unspecified: Secondary | ICD-10-CM | POA: Diagnosis not present

## 2021-05-18 DIAGNOSIS — I69354 Hemiplegia and hemiparesis following cerebral infarction affecting left non-dominant side: Secondary | ICD-10-CM | POA: Diagnosis not present

## 2021-05-30 DIAGNOSIS — I69354 Hemiplegia and hemiparesis following cerebral infarction affecting left non-dominant side: Secondary | ICD-10-CM | POA: Diagnosis not present

## 2021-05-30 DIAGNOSIS — F330 Major depressive disorder, recurrent, mild: Secondary | ICD-10-CM | POA: Diagnosis not present

## 2021-05-30 DIAGNOSIS — E785 Hyperlipidemia, unspecified: Secondary | ICD-10-CM | POA: Diagnosis not present

## 2021-05-30 DIAGNOSIS — E1169 Type 2 diabetes mellitus with other specified complication: Secondary | ICD-10-CM | POA: Diagnosis not present

## 2021-05-30 DIAGNOSIS — Z89612 Acquired absence of left leg above knee: Secondary | ICD-10-CM | POA: Diagnosis not present

## 2021-05-30 DIAGNOSIS — Z9714 Presence of artificial left leg (complete) (partial): Secondary | ICD-10-CM | POA: Diagnosis not present

## 2021-05-30 DIAGNOSIS — Z9989 Dependence on other enabling machines and devices: Secondary | ICD-10-CM | POA: Diagnosis not present

## 2021-05-30 DIAGNOSIS — Z7984 Long term (current) use of oral hypoglycemic drugs: Secondary | ICD-10-CM | POA: Diagnosis not present

## 2021-06-01 DIAGNOSIS — Z299 Encounter for prophylactic measures, unspecified: Secondary | ICD-10-CM | POA: Diagnosis not present

## 2021-06-01 DIAGNOSIS — S88112S Complete traumatic amputation at level between knee and ankle, left lower leg, sequela: Secondary | ICD-10-CM | POA: Diagnosis not present

## 2021-06-01 DIAGNOSIS — Z6822 Body mass index (BMI) 22.0-22.9, adult: Secondary | ICD-10-CM | POA: Diagnosis not present

## 2021-06-01 DIAGNOSIS — Z0000 Encounter for general adult medical examination without abnormal findings: Secondary | ICD-10-CM | POA: Diagnosis not present

## 2021-06-01 DIAGNOSIS — I10 Essential (primary) hypertension: Secondary | ICD-10-CM | POA: Diagnosis not present

## 2021-06-01 DIAGNOSIS — E1165 Type 2 diabetes mellitus with hyperglycemia: Secondary | ICD-10-CM | POA: Diagnosis not present

## 2021-07-09 DIAGNOSIS — E1151 Type 2 diabetes mellitus with diabetic peripheral angiopathy without gangrene: Secondary | ICD-10-CM | POA: Diagnosis not present

## 2021-07-09 DIAGNOSIS — M79671 Pain in right foot: Secondary | ICD-10-CM | POA: Diagnosis not present

## 2021-07-09 DIAGNOSIS — L110 Acquired keratosis follicularis: Secondary | ICD-10-CM | POA: Diagnosis not present

## 2021-07-09 DIAGNOSIS — M79672 Pain in left foot: Secondary | ICD-10-CM | POA: Diagnosis not present

## 2021-07-09 DIAGNOSIS — E1140 Type 2 diabetes mellitus with diabetic neuropathy, unspecified: Secondary | ICD-10-CM | POA: Diagnosis not present

## 2021-09-04 DIAGNOSIS — Z299 Encounter for prophylactic measures, unspecified: Secondary | ICD-10-CM | POA: Diagnosis not present

## 2021-09-04 DIAGNOSIS — I10 Essential (primary) hypertension: Secondary | ICD-10-CM | POA: Diagnosis not present

## 2021-09-04 DIAGNOSIS — D6869 Other thrombophilia: Secondary | ICD-10-CM | POA: Diagnosis not present

## 2021-09-04 DIAGNOSIS — I739 Peripheral vascular disease, unspecified: Secondary | ICD-10-CM | POA: Diagnosis not present

## 2021-09-04 DIAGNOSIS — E1165 Type 2 diabetes mellitus with hyperglycemia: Secondary | ICD-10-CM | POA: Diagnosis not present

## 2021-09-04 DIAGNOSIS — Z6822 Body mass index (BMI) 22.0-22.9, adult: Secondary | ICD-10-CM | POA: Diagnosis not present

## 2021-09-20 DIAGNOSIS — E1165 Type 2 diabetes mellitus with hyperglycemia: Secondary | ICD-10-CM | POA: Diagnosis not present

## 2021-09-20 DIAGNOSIS — I739 Peripheral vascular disease, unspecified: Secondary | ICD-10-CM | POA: Diagnosis not present

## 2021-09-20 DIAGNOSIS — K219 Gastro-esophageal reflux disease without esophagitis: Secondary | ICD-10-CM | POA: Diagnosis not present

## 2021-09-20 DIAGNOSIS — E785 Hyperlipidemia, unspecified: Secondary | ICD-10-CM | POA: Diagnosis not present

## 2021-10-08 DIAGNOSIS — E1140 Type 2 diabetes mellitus with diabetic neuropathy, unspecified: Secondary | ICD-10-CM | POA: Diagnosis not present

## 2021-10-08 DIAGNOSIS — M79672 Pain in left foot: Secondary | ICD-10-CM | POA: Diagnosis not present

## 2021-10-08 DIAGNOSIS — M79671 Pain in right foot: Secondary | ICD-10-CM | POA: Diagnosis not present

## 2021-10-08 DIAGNOSIS — L110 Acquired keratosis follicularis: Secondary | ICD-10-CM | POA: Diagnosis not present

## 2021-10-08 DIAGNOSIS — E1151 Type 2 diabetes mellitus with diabetic peripheral angiopathy without gangrene: Secondary | ICD-10-CM | POA: Diagnosis not present

## 2021-12-06 DIAGNOSIS — Z6821 Body mass index (BMI) 21.0-21.9, adult: Secondary | ICD-10-CM | POA: Diagnosis not present

## 2021-12-06 DIAGNOSIS — Z7984 Long term (current) use of oral hypoglycemic drugs: Secondary | ICD-10-CM | POA: Diagnosis not present

## 2021-12-06 DIAGNOSIS — Z9714 Presence of artificial left leg (complete) (partial): Secondary | ICD-10-CM | POA: Diagnosis not present

## 2021-12-06 DIAGNOSIS — E785 Hyperlipidemia, unspecified: Secondary | ICD-10-CM | POA: Diagnosis not present

## 2021-12-06 DIAGNOSIS — E1169 Type 2 diabetes mellitus with other specified complication: Secondary | ICD-10-CM | POA: Diagnosis not present

## 2021-12-06 DIAGNOSIS — I69354 Hemiplegia and hemiparesis following cerebral infarction affecting left non-dominant side: Secondary | ICD-10-CM | POA: Diagnosis not present

## 2021-12-06 DIAGNOSIS — Z89512 Acquired absence of left leg below knee: Secondary | ICD-10-CM | POA: Diagnosis not present

## 2021-12-11 DIAGNOSIS — E119 Type 2 diabetes mellitus without complications: Secondary | ICD-10-CM | POA: Diagnosis not present

## 2021-12-11 DIAGNOSIS — Z2821 Immunization not carried out because of patient refusal: Secondary | ICD-10-CM | POA: Diagnosis not present

## 2021-12-11 DIAGNOSIS — F0280 Dementia in other diseases classified elsewhere without behavioral disturbance: Secondary | ICD-10-CM | POA: Diagnosis not present

## 2021-12-11 DIAGNOSIS — E1165 Type 2 diabetes mellitus with hyperglycemia: Secondary | ICD-10-CM | POA: Diagnosis not present

## 2021-12-11 DIAGNOSIS — G309 Alzheimer's disease, unspecified: Secondary | ICD-10-CM | POA: Diagnosis not present

## 2021-12-11 DIAGNOSIS — Z299 Encounter for prophylactic measures, unspecified: Secondary | ICD-10-CM | POA: Diagnosis not present

## 2021-12-11 DIAGNOSIS — I10 Essential (primary) hypertension: Secondary | ICD-10-CM | POA: Diagnosis not present

## 2022-01-07 DIAGNOSIS — L110 Acquired keratosis follicularis: Secondary | ICD-10-CM | POA: Diagnosis not present

## 2022-01-07 DIAGNOSIS — E1140 Type 2 diabetes mellitus with diabetic neuropathy, unspecified: Secondary | ICD-10-CM | POA: Diagnosis not present

## 2022-01-07 DIAGNOSIS — M79671 Pain in right foot: Secondary | ICD-10-CM | POA: Diagnosis not present

## 2022-01-07 DIAGNOSIS — E1151 Type 2 diabetes mellitus with diabetic peripheral angiopathy without gangrene: Secondary | ICD-10-CM | POA: Diagnosis not present

## 2022-01-07 DIAGNOSIS — M79672 Pain in left foot: Secondary | ICD-10-CM | POA: Diagnosis not present

## 2022-02-07 ENCOUNTER — Other Ambulatory Visit (HOSPITAL_COMMUNITY): Payer: Self-pay | Admitting: Psychiatry

## 2022-02-11 NOTE — Telephone Encounter (Signed)
Call for appt

## 2022-02-21 DIAGNOSIS — Z1331 Encounter for screening for depression: Secondary | ICD-10-CM | POA: Diagnosis not present

## 2022-02-21 DIAGNOSIS — Z0000 Encounter for general adult medical examination without abnormal findings: Secondary | ICD-10-CM | POA: Diagnosis not present

## 2022-02-21 DIAGNOSIS — Z299 Encounter for prophylactic measures, unspecified: Secondary | ICD-10-CM | POA: Diagnosis not present

## 2022-02-21 DIAGNOSIS — Z6822 Body mass index (BMI) 22.0-22.9, adult: Secondary | ICD-10-CM | POA: Diagnosis not present

## 2022-02-21 DIAGNOSIS — I10 Essential (primary) hypertension: Secondary | ICD-10-CM | POA: Diagnosis not present

## 2022-02-21 DIAGNOSIS — E7800 Pure hypercholesterolemia, unspecified: Secondary | ICD-10-CM | POA: Diagnosis not present

## 2022-02-21 DIAGNOSIS — Z89512 Acquired absence of left leg below knee: Secondary | ICD-10-CM | POA: Diagnosis not present

## 2022-02-21 DIAGNOSIS — M549 Dorsalgia, unspecified: Secondary | ICD-10-CM | POA: Diagnosis not present

## 2022-02-21 DIAGNOSIS — Z1339 Encounter for screening examination for other mental health and behavioral disorders: Secondary | ICD-10-CM | POA: Diagnosis not present

## 2022-02-21 DIAGNOSIS — Z7189 Other specified counseling: Secondary | ICD-10-CM | POA: Diagnosis not present

## 2022-03-11 ENCOUNTER — Encounter (HOSPITAL_COMMUNITY): Payer: Self-pay | Admitting: Psychiatry

## 2022-03-11 ENCOUNTER — Ambulatory Visit (HOSPITAL_COMMUNITY): Payer: PPO | Admitting: Psychiatry

## 2022-03-11 VITALS — BP 119/55 | HR 57 | Ht 69.5 in | Wt 151.2 lb

## 2022-03-11 DIAGNOSIS — F322 Major depressive disorder, single episode, severe without psychotic features: Secondary | ICD-10-CM

## 2022-03-11 MED ORDER — ARIPIPRAZOLE 5 MG PO TABS: 5.0000 mg | ORAL_TABLET | Freq: Every day | ORAL | 2 refills | Status: DC

## 2022-03-11 MED ORDER — FLUOXETINE HCL 20 MG PO CAPS: 20.0000 mg | ORAL_CAPSULE | Freq: Every morning | ORAL | 2 refills | Status: DC

## 2022-03-11 NOTE — Progress Notes (Signed)
BH MD/PA/NP OP Progress Note  03/11/2022 2:36 PM Chad Grant  MRN:  [id number]  Chief Complaint:  Chief Complaint  Patient presents with   Depression   Follow-up   HPI: This patient is a 72 year old married white male lives with his wife in [place].  He has 2 grown children.  He used to work at [employer] but had to stop working in 2005 due to a below-knee amputation on his left leg.  The patient and wife return in person after about 11 months.  Since I last saw him he had another CVA last March.  Apparently this has affected his swallowing and he was in rehab for about 3 weeks.  He is now swallowing well although his wife has to make sure he chews his food well before he swallows.  He is appetite has been good.  His weight has remained stable around 151 pounds.  He is sleeping well at night.  He denies being depressed.  His affect is still rather flat probably due to to the dementia.  The wife states that he is "mellowed out" and is no longer as irritable and difficult.  He still is shuffling his right leg quite a bit but what size it has not worsened and she really does not want to add any more medications. Visit Diagnosis:    ICD-10-CM   1. Severe single current episode of major depressive disorder, without psychotic features ([place])  F32.2       Past Psychiatric History: The patient has been seen in our office since 2009. He has been on numerous mood stabilizers and antidepressants   Past Medical History:  Past Medical History:  Diagnosis Date   Dementia ([place])    Depression    Diabetes mellitus type II    Hypercholesteremia    Hypertension    S/P BKA (below knee amputation) unilateral ([place])    Stroke ([place]) 2013   left sided weakness   TIA (transient ischemic attack)     Past Surgical History:  Procedure Laterality Date   ABDOMINAL AORTAGRAM N/A 04/29/2012   Procedure: ABDOMINAL Maxcine Ham;  Surgeon: Serafina Mitchell, MD;  Location: [facility] CATH LAB;  Service: Cardiovascular;   Laterality: N/A;   AMPUTATION Right 05/15/2018   Procedure: AMPUTATION RIGHT THIRD TOE;  Surgeon: Caprice Beaver, DPM;  Location: [facility];  Service: Podiatry;  Laterality: Right;   CATARACT EXTRACTION W/PHACO Left 12/04/2015   Procedure: CATARACT EXTRACTION PHACO AND INTRAOCULAR LENS PLACEMENT LEFT EYE CDE=19.92;  Surgeon: Tonny Branch, MD;  Location: [facility];  Service: Ophthalmology;  Laterality: Left;  left   cataract removed Bilateral    left leg amputa     BKA   left leg amputated     SHOULDER SURGERY Right    rotator cuff    Family Psychiatric History: See below  Family History:  Family History  Problem Relation Age of Onset   Heart disease Mother    Diabetes Father    Alcohol abuse Brother    Diabetes Brother    Diabetes Sister    Peripheral vascular disease Sister        amputation   Diabetes Daughter    Anxiety disorder Neg Hx    Bipolar disorder Neg Hx    Dementia Neg Hx    Depression Neg Hx    Drug abuse Neg Hx    Paranoid behavior Neg Hx    Schizophrenia Neg Hx    OCD Neg Hx    Seizures Neg Hx  Sexual abuse Neg Hx    Physical abuse Neg Hx    ADD / ADHD Neg Hx    Colon cancer Neg Hx    Colon polyps Neg Hx     Social History:  Social History   Socioeconomic History   Marital status: Married    Spouse name: Not on file   Number of children: Not on file   Years of education: Not on file   Highest education level: Not on file  Occupational History   Not on file  Tobacco Use   Smoking status: Never   Smokeless tobacco: Never  Vaping Use   Vaping Use: Never used  Substance and Sexual Activity   Alcohol use: No    Comment: in the past but not since amputation in 2009, drank alcohol on weekends    Drug use: No   Sexual activity: Not Currently  Other Topics Concern   Not on file  Social History Narrative   Not on file   Social Determinants of Health   Financial Resource Strain: Not on file  Food Insecurity: Not on file  Transportation Needs:  Not on file  Physical Activity: Not on file  Stress: Not on file  Social Connections: Not on file    Allergies: No Known Allergies  Metabolic Disorder Labs: Lab Results  Component Value Date   HGBA1C 7.2 (H) 05/13/2018   MPG 159.94 05/13/2018   MPG 147 08/14/2007   No results found for: "PROLACTIN" No results found for: "CHOL", "TRIG", "HDL", "CHOLHDL", "VLDL", "LDLCALC" No results found for: "TSH"  Therapeutic Level Labs: No results found for: "LITHIUM" No results found for: "VALPROATE" No results found for: "CBMZ"  Current Medications: Current Outpatient Medications  Medication Sig Dispense Refill   clopidogrel (PLAVIX) 75 MG tablet Take 1 tablet (75 mg total) by mouth daily.     glipiZIDE (GLUCOTROL) 5 MG tablet Take 5 mg by mouth daily.     ipratropium (ATROVENT) 0.03 % nasal spray SMARTSIG:2 Spray(s) Both Nares Every 12 Hours PRN     loperamide (IMODIUM) 2 MG capsule Take by mouth as needed for diarrhea or loose stools.     Melatonin 5 MG CAPS Take 10 mg by mouth at bedtime as needed (sleep).     meloxicam (MOBIC) 15 MG tablet Take 15 mg by mouth daily as needed.     ondansetron (ZOFRAN) 4 MG tablet Take by mouth.     pantoprazole (PROTONIX) 20 MG tablet Take 1 tablet by mouth daily.     rosuvastatin (CRESTOR) 5 MG tablet Take 5 mg by mouth every other day.     ARIPiprazole (ABILIFY) 5 MG tablet Take 1 tablet (5 mg total) by mouth at bedtime. 90 tablet 2   FLUoxetine (PROZAC) 20 MG capsule Take 1 capsule (20 mg total) by mouth every morning. 90 capsule 2   lipase/protease/amylase (CREON) 36000 UNITS CPEP capsule Take 2 capsules (72,000 Units total) by mouth 3 (three) times daily with meals. 1 capsule with snacks (Patient not taking: Reported on 03/11/2022) 240 capsule 3   No current facility-administered medications for this visit.     Musculoskeletal: Strength & Muscle Tone: decreased Gait & Station: unsteady, shuffle walking with a walker Patient leans:  N/A  Psychiatric Specialty Exam: Review of Systems  Musculoskeletal:  Positive for gait problem.  Neurological:  Positive for tremors and weakness.  All other systems reviewed and are negative.   Blood pressure (!) 119/55, pulse (!) 57, height 5' 9.5" (1.765 m), weight  151 lb 3.2 oz (68.6 kg), SpO2 99 %.Body mass index is 22.01 kg/m.  General Appearance: Casual and Fairly Groomed  Eye Contact:  Fair  Speech:  Slow  Volume:  Decreased  Mood:   Flat  Affect:  Blunt  Thought Process:  Coherent  Orientation:  Other:  Person and place  Thought Content: WDL   Suicidal Thoughts:  No  Homicidal Thoughts:  No  Memory:  Immediate;   Fair Recent;   Poor Remote;   Poor  Judgement:  Impaired  Insight:  Shallow  Psychomotor Activity:  Decreased and Shuffling Gait  Concentration:  Concentration: Fair and Attention Span: Fair  Recall:  Poor  Fund of Knowledge: Fair  Language: Fair  Akathisia:  No  Handed:  Right  AIMS (if indicated): Constant tapping of right foot otherwise normal  Assets:  Communication Skills Resilience Social Support  ADL's:  Impaired  Cognition: Impaired,  Moderate  Sleep:  Good   Screenings: AUDIT    Flowsheet Row Admission (Discharged) from 07/03/2012 in [facility]  Alcohol Use Disorder Identification Test Final Score (AUDIT) 0      PHQ2-9    Flowsheet Row Video Visit from 03/28/2021 in [facility] [place] [facility] [place] Video Visit from 04/05/2020 in [place] at [facility] Total Score 0 0      Flowsheet Row Video Visit from 03/28/2021 in [facility] [place] Video Visit from 04/05/2020 in [facility] [facility] [place] No Risk No Risk        Assessment and Plan: This patient is a 72 year old male with a history of depression irritability and moderate dementia.  He is doing fairly  well considering his disabilities and his wife takes very good care of him.  He seems stable on the Prozac 20 mg daily for depression and Abilify 5 mg at bedtime for mood stabilization.  He will return to see me in 6 months or call sooner as needed  Collaboration of Care: Collaboration of Care: Primary Care Provider AEB notes will be shared with PCP at patient or guardian's request  Patient/Guardian was advised Release of Information must be obtained prior to any record release in order to collaborate their care with an outside provider. Patient/Guardian was advised if they have not already done so to contact the registration department to sign all necessary forms in order for [place] to release information regarding their care.   Consent: Patient/Guardian gives verbal consent for treatment and assignment of benefits for services provided during this visit. Patient/Guardian expressed understanding and agreed to proceed.    Levonne Spiller, MD 03/11/2022, 2:36 PM

## 2022-06-06 DIAGNOSIS — Z961 Presence of intraocular lens: Secondary | ICD-10-CM | POA: Diagnosis not present

## 2022-06-06 DIAGNOSIS — E119 Type 2 diabetes mellitus without complications: Secondary | ICD-10-CM | POA: Diagnosis not present

## 2022-06-06 DIAGNOSIS — Z7984 Long term (current) use of oral hypoglycemic drugs: Secondary | ICD-10-CM | POA: Diagnosis not present

## 2022-06-11 DIAGNOSIS — I10 Essential (primary) hypertension: Secondary | ICD-10-CM | POA: Diagnosis not present

## 2022-06-11 DIAGNOSIS — I739 Peripheral vascular disease, unspecified: Secondary | ICD-10-CM | POA: Diagnosis not present

## 2022-06-11 DIAGNOSIS — E1165 Type 2 diabetes mellitus with hyperglycemia: Secondary | ICD-10-CM | POA: Diagnosis not present

## 2022-06-11 DIAGNOSIS — I69354 Hemiplegia and hemiparesis following cerebral infarction affecting left non-dominant side: Secondary | ICD-10-CM | POA: Diagnosis not present

## 2022-06-11 DIAGNOSIS — Z299 Encounter for prophylactic measures, unspecified: Secondary | ICD-10-CM | POA: Diagnosis not present

## 2022-06-11 DIAGNOSIS — E1151 Type 2 diabetes mellitus with diabetic peripheral angiopathy without gangrene: Secondary | ICD-10-CM | POA: Diagnosis not present

## 2022-08-27 ENCOUNTER — Telehealth (HOSPITAL_COMMUNITY): Payer: PPO | Admitting: Psychiatry

## 2022-08-27 ENCOUNTER — Encounter (HOSPITAL_COMMUNITY): Payer: Self-pay | Admitting: Psychiatry

## 2022-08-27 DIAGNOSIS — F322 Major depressive disorder, single episode, severe without psychotic features: Secondary | ICD-10-CM

## 2022-08-27 DIAGNOSIS — F09 Unspecified mental disorder due to known physiological condition: Secondary | ICD-10-CM

## 2022-08-27 MED ORDER — FLUOXETINE HCL 20 MG PO CAPS: 20.0000 mg | ORAL_CAPSULE | Freq: Every morning | ORAL | 2 refills | Status: DC

## 2022-08-27 MED ORDER — ARIPIPRAZOLE 5 MG PO TABS: 5.0000 mg | ORAL_TABLET | Freq: Every day | ORAL | 2 refills | Status: DC

## 2022-08-27 NOTE — Progress Notes (Signed)
Virtual Visit via Telephone Note  I connected with Chad Grant on 08/27/22 at  2:00 PM EDT by telephone and verified that I am speaking with the correct person using two identifiers.  Location: Patient: home Provider: office   I discussed the limitations, risks, security and privacy concerns of performing an evaluation and management service by telephone and the availability of in person appointments. I also discussed with the patient that there may be a patient responsible charge related to this service. The patient expressed understanding and agreed to proceed.    I discussed the assessment and treatment plan with the patient. The patient was provided an opportunity to ask questions and all were answered. The patient agreed with the plan and demonstrated an understanding of the instructions.   The patient was advised to call back or seek an in-person evaluation if the symptoms worsen or if the condition fails to improve as anticipated.  I provided 15 minutes of non-face-to-face time during this encounter.   Diannia Ruder, MD  [facility] MD/PA/NP OP Progress Note  08/27/2022 2:22 PM Chad Grant  MRN:  [id number]  Chief Complaint:  Chief Complaint  Patient presents with   Depression   Follow-up   HPI: This patient is a 72 year old married white male lives with his wife in [place]. He has 2 grown children. He used to work at [employer] but had to stop working in 2005 due to a below-knee amputation on his left leg.   The patient wife returned by phone after about 6 months.  As usual he answers questions in monosyllables but claims that everything is fine.  His wife concurs that he has been stable.  They both deny that he has any significant depression anxiety or thoughts of self-harm.  He is sleeping well.  He is doing better with swallowing.  According to the wife his weight has remained stable.  He is no longer as irritable and angry.  They both think that the medications-Prozac and  Abilify have helped his mood.  He denies any thoughts of self-harm or suicide. Visit Diagnosis:    ICD-10-CM   1. Severe single current episode of major depressive disorder, without psychotic features (HCC)  F32.2     2. Cognitive disorder  F09       Past Psychiatric History: The patient has been seen in our office since 2009. He has been on numerous mood stabilizers and antidepressants   Past Medical History:  Past Medical History:  Diagnosis Date   Dementia (HCC)    Depression    Diabetes mellitus type II    Hypercholesteremia    Hypertension    S/P BKA (below knee amputation) unilateral (HCC)    Stroke (HCC) 2013   left sided weakness   TIA (transient ischemic attack)     Past Surgical History:  Procedure Laterality Date   ABDOMINAL AORTAGRAM N/A 04/29/2012   Procedure: ABDOMINAL Ronny Flurry;  Surgeon: Nada Libman, MD;  Location: [facility] CATH LAB;  Service: Cardiovascular;  Laterality: N/A;   AMPUTATION Right 05/15/2018   Procedure: AMPUTATION RIGHT THIRD TOE;  Surgeon: Ferman Hamming, DPM;  Location: [facility]S;  Service: Podiatry;  Laterality: Right;   CATARACT EXTRACTION W/PHACO Left 12/04/2015   Procedure: CATARACT EXTRACTION PHACO AND INTRAOCULAR LENS PLACEMENT LEFT EYE CDE=19.92;  Surgeon: Gemma Payor, MD;  Location: [facility];  Service: Ophthalmology;  Laterality: Left;  left   cataract removed Bilateral    left leg amputa     BKA  left leg amputated     SHOULDER SURGERY Right    rotator cuff    Family Psychiatric History: See below  Family History:  Family History  Problem Relation Age of Onset   Heart disease Mother    Diabetes Father    Alcohol abuse Brother    Diabetes Brother    Diabetes Sister    Peripheral vascular disease Sister        amputation   Diabetes Daughter    Anxiety disorder Neg Hx    Bipolar disorder Neg Hx    Dementia Neg Hx    Depression Neg Hx    Drug abuse Neg Hx    Paranoid behavior Neg Hx    Schizophrenia Neg Hx    OCD Neg Hx     Seizures Neg Hx    Sexual abuse Neg Hx    Physical abuse Neg Hx    ADD / ADHD Neg Hx    Colon cancer Neg Hx    Colon polyps Neg Hx     Social History:  Social History   Socioeconomic History   Marital status: Married    Spouse name: Not on file   Number of children: Not on file   Years of education: Not on file   Highest education level: Not on file  Occupational History   Not on file  Tobacco Use   Smoking status: Never   Smokeless tobacco: Never  Vaping Use   Vaping status: Never Used  Substance and Sexual Activity   Alcohol use: No    Comment: in the past but not since amputation in 2009, drank alcohol on weekends    Drug use: No   Sexual activity: Not Currently  Other Topics Concern   Not on file  Social History Narrative   Not on file   Social Determinants of Health   Financial Resource Strain: Low Risk  (04/02/2021)   Received [facility], [facility]   Overall Financial Resource Strain (CARDIA)    Difficulty of Paying Living Expenses: Not hard at all  Food Insecurity: No Food Insecurity (04/02/2021)   Received from [facility], [facility]   Hunger Vital Sign    Worried About Running Out of Food in the Last Year: Never true    Ran Out of Food in the Last Year: Never true  Transportation Needs: No Transportation Needs (04/02/2021)   [facility], [facility]   PRAPARE - Transportation    Lack of Transportation (Medical): No    Lack of Transportation (Non-Medical): No  Physical Activity: Not on file  Stress: Not on file  Social Connections: Unknown (06/01/2021)   Received from [facility], [facility]   Social Network    Social Network: Not on file    Allergies: No Known Allergies  Metabolic Disorder Labs: Lab Results  Component Value Date   HGBA1C 7.2 (H) 05/13/2018   MPG 159.94 05/13/2018   MPG 147 08/14/2007   No results found for: "PROLACTIN" No results found for: "CHOL", "TRIG", "HDL", "CHOLHDL",  "VLDL", "LDLCALC" No results found for: "TSH"  Therapeutic Level Labs: No results found for: "LITHIUM" No results found for: "VALPROATE" No results found for: "CBMZ"  Current Medications: Current Outpatient Medications  Medication Sig Dispense Refill   ARIPiprazole (ABILIFY) 5 MG tablet Take 1 tablet (5 mg total) by mouth at bedtime. 90 tablet 2   clopidogrel (PLAVIX) 75 MG tablet Take 1 tablet (75 mg total) by  mouth daily.     FLUoxetine (PROZAC) 20 MG capsule Take 1 capsule (20 mg total) by mouth every morning. 90 capsule 2   glipiZIDE (GLUCOTROL) 5 MG tablet Take 5 mg by mouth daily.     ipratropium (ATROVENT) 0.03 % nasal spray SMARTSIG:2 Spray(s) Both Nares Every 12 Hours PRN     lipase/protease/amylase (CREON) 36000 UNITS CPEP capsule Take 2 capsules (72,000 Units total) by mouth 3 (three) times daily with meals. 1 capsule with snacks (Patient not taking: Reported on 03/11/2022) 240 capsule 3   loperamide (IMODIUM) 2 MG capsule Take by mouth as needed for diarrhea or loose stools.     Melatonin 5 MG CAPS Take 10 mg by mouth at bedtime as needed (sleep).     meloxicam (MOBIC) 15 MG tablet Take 15 mg by mouth daily as needed.     ondansetron (ZOFRAN) 4 MG tablet Take by mouth.     pantoprazole (PROTONIX) 20 MG tablet Take 1 tablet by mouth daily.     rosuvastatin (CRESTOR) 5 MG tablet Take 5 mg by mouth every other day.     No current facility-administered medications for this visit.     Musculoskeletal: Strength & Muscle Tone: na Gait & Station: na Patient leans: N/A  Psychiatric Specialty Exam: Review of Systems  Musculoskeletal:  Positive for gait problem.  Neurological:  Positive for weakness.  All other systems reviewed and are negative.   There were no vitals taken for this visit.There is no height or weight on file to calculate BMI.  General Appearance: NA  Eye Contact:  NA  Speech:  Clear and Coherent  Volume:  Decreased  Mood:  Euthymic  Affect:  na  Thought  Process:  Goal Directed  Orientation:  Full (Time, Place, and Person)  Thought Content: WDL   Suicidal Thoughts:  No  Homicidal Thoughts:  No  Memory:  Immediate;   Good Recent;   Fair Remote;   Poor  Judgement:  Poor  Insight:  Shallow  Psychomotor Activity:  Decreased  Concentration:  Concentration: Fair and Attention Span: Fair  Recall:  [employer] of Knowledge: Fair  Language: Fair  Akathisia:  No  Handed:  Right  AIMS (if indicated): not done  Assets:  Communication Skills Desire for Improvement Resilience Social Support  ADL's:  Intact  Cognition: Impaired,  Mild  Sleep:  Good   Screenings: AUDIT    Flowsheet Row [facility]  Alcohol Use Disorder Identification Test Final Score (AUDIT) 0      PHQ2-9    Flowsheet Row Video Visit from 03/28/2021 in [facility] at [place] Video Visit from 04/05/2020 in [facility] at [facility] Total Score 0 0      Flowsheet Row Video Visit from 03/28/2021 in [facility] at [place] Video Visit from 04/05/2020 in [facility] at [place]  C-SSRS RISK CATEGORY No Risk No Risk        Assessment and Plan: This patient is a 72 year old male with a history of depression irritability and moderate dementia.  His wife takes good care of him and so far he has remained fairly stable in terms of mood.  He will continue Prozac 20 mg daily for depression and Abilify 5 mg at bedtime for mood stabilization.  He will return to see me in 6 months  Collaboration of Care: Collaboration of Care: Primary  Care Provider AEB notes will be shared with PCP at patient and guardian's request  Patient/Guardian was advised Release of Information must be obtained prior to any record release in order to collaborate their care with an outside provider.  Patient/Guardian was advised if they have not already done so to contact the registration department to sign all necessary forms in order for [place] to release information regarding their care.   Consent: Patient/Guardian gives verbal consent for treatment and assignment of benefits for services provided during this visit. Patient/Guardian expressed understanding and agreed to proceed.    Diannia Ruder, MD 08/27/2022, 2:22 PM

## 2022-09-02 DIAGNOSIS — M79671 Pain in right foot: Secondary | ICD-10-CM | POA: Diagnosis not present

## 2022-09-02 DIAGNOSIS — E1151 Type 2 diabetes mellitus with diabetic peripheral angiopathy without gangrene: Secondary | ICD-10-CM | POA: Diagnosis not present

## 2022-09-02 DIAGNOSIS — L110 Acquired keratosis follicularis: Secondary | ICD-10-CM | POA: Diagnosis not present

## 2022-09-02 DIAGNOSIS — E1140 Type 2 diabetes mellitus with diabetic neuropathy, unspecified: Secondary | ICD-10-CM | POA: Diagnosis not present

## 2022-09-02 DIAGNOSIS — M79672 Pain in left foot: Secondary | ICD-10-CM | POA: Diagnosis not present

## 2022-09-06 DIAGNOSIS — F322 Major depressive disorder, single episode, severe without psychotic features: Secondary | ICD-10-CM | POA: Diagnosis not present

## 2022-09-06 DIAGNOSIS — F0153 Vascular dementia, unspecified severity, with mood disturbance: Secondary | ICD-10-CM | POA: Diagnosis not present

## 2022-09-06 DIAGNOSIS — Z89512 Acquired absence of left leg below knee: Secondary | ICD-10-CM | POA: Diagnosis not present

## 2022-09-06 DIAGNOSIS — Z9181 History of falling: Secondary | ICD-10-CM | POA: Diagnosis not present

## 2022-09-06 DIAGNOSIS — Z6822 Body mass index (BMI) 22.0-22.9, adult: Secondary | ICD-10-CM | POA: Diagnosis not present

## 2022-09-06 DIAGNOSIS — Z515 Encounter for palliative care: Secondary | ICD-10-CM | POA: Diagnosis not present

## 2022-09-06 DIAGNOSIS — I69354 Hemiplegia and hemiparesis following cerebral infarction affecting left non-dominant side: Secondary | ICD-10-CM | POA: Diagnosis not present

## 2022-09-06 DIAGNOSIS — Z66 Do not resuscitate: Secondary | ICD-10-CM | POA: Diagnosis not present

## 2022-10-16 DIAGNOSIS — Z299 Encounter for prophylactic measures, unspecified: Secondary | ICD-10-CM | POA: Diagnosis not present

## 2022-10-16 DIAGNOSIS — Z23 Encounter for immunization: Secondary | ICD-10-CM | POA: Diagnosis not present

## 2022-10-16 DIAGNOSIS — E1165 Type 2 diabetes mellitus with hyperglycemia: Secondary | ICD-10-CM | POA: Diagnosis not present

## 2022-10-16 DIAGNOSIS — Z125 Encounter for screening for malignant neoplasm of prostate: Secondary | ICD-10-CM | POA: Diagnosis not present

## 2022-10-16 DIAGNOSIS — I69354 Hemiplegia and hemiparesis following cerebral infarction affecting left non-dominant side: Secondary | ICD-10-CM | POA: Diagnosis not present

## 2022-10-16 DIAGNOSIS — R5383 Other fatigue: Secondary | ICD-10-CM | POA: Diagnosis not present

## 2022-10-16 DIAGNOSIS — I10 Essential (primary) hypertension: Secondary | ICD-10-CM | POA: Diagnosis not present

## 2022-10-16 DIAGNOSIS — E7800 Pure hypercholesterolemia, unspecified: Secondary | ICD-10-CM | POA: Diagnosis not present

## 2022-10-16 DIAGNOSIS — Z79899 Other long term (current) drug therapy: Secondary | ICD-10-CM | POA: Diagnosis not present

## 2022-10-16 DIAGNOSIS — Z0000 Encounter for general adult medical examination without abnormal findings: Secondary | ICD-10-CM | POA: Diagnosis not present

## 2022-12-02 DIAGNOSIS — M79672 Pain in left foot: Secondary | ICD-10-CM | POA: Diagnosis not present

## 2022-12-02 DIAGNOSIS — L110 Acquired keratosis follicularis: Secondary | ICD-10-CM | POA: Diagnosis not present

## 2022-12-02 DIAGNOSIS — M79671 Pain in right foot: Secondary | ICD-10-CM | POA: Diagnosis not present

## 2022-12-02 DIAGNOSIS — E1140 Type 2 diabetes mellitus with diabetic neuropathy, unspecified: Secondary | ICD-10-CM | POA: Diagnosis not present

## 2022-12-02 DIAGNOSIS — E1151 Type 2 diabetes mellitus with diabetic peripheral angiopathy without gangrene: Secondary | ICD-10-CM | POA: Diagnosis not present

## 2023-03-19 DIAGNOSIS — M47816 Spondylosis without myelopathy or radiculopathy, lumbar region: Secondary | ICD-10-CM | POA: Diagnosis not present

## 2023-03-19 DIAGNOSIS — E7800 Pure hypercholesterolemia, unspecified: Secondary | ICD-10-CM | POA: Diagnosis not present

## 2023-03-19 DIAGNOSIS — M48061 Spinal stenosis, lumbar region without neurogenic claudication: Secondary | ICD-10-CM | POA: Diagnosis not present

## 2023-03-19 DIAGNOSIS — M5136 Other intervertebral disc degeneration, lumbar region with discogenic back pain only: Secondary | ICD-10-CM | POA: Diagnosis not present

## 2023-03-19 DIAGNOSIS — M549 Dorsalgia, unspecified: Secondary | ICD-10-CM | POA: Diagnosis not present

## 2023-03-19 DIAGNOSIS — M51369 Other intervertebral disc degeneration, lumbar region without mention of lumbar back pain or lower extremity pain: Secondary | ICD-10-CM | POA: Diagnosis not present

## 2023-03-19 DIAGNOSIS — G2401 Drug induced subacute dyskinesia: Secondary | ICD-10-CM | POA: Diagnosis not present

## 2023-03-19 DIAGNOSIS — Z299 Encounter for prophylactic measures, unspecified: Secondary | ICD-10-CM | POA: Diagnosis not present

## 2023-03-19 DIAGNOSIS — I69354 Hemiplegia and hemiparesis following cerebral infarction affecting left non-dominant side: Secondary | ICD-10-CM | POA: Diagnosis not present

## 2023-03-19 DIAGNOSIS — R52 Pain, unspecified: Secondary | ICD-10-CM | POA: Diagnosis not present

## 2023-03-19 DIAGNOSIS — R5383 Other fatigue: Secondary | ICD-10-CM | POA: Diagnosis not present

## 2023-03-19 DIAGNOSIS — Z1331 Encounter for screening for depression: Secondary | ICD-10-CM | POA: Diagnosis not present

## 2023-03-19 DIAGNOSIS — Z7189 Other specified counseling: Secondary | ICD-10-CM | POA: Diagnosis not present

## 2023-03-19 DIAGNOSIS — I10 Essential (primary) hypertension: Secondary | ICD-10-CM | POA: Diagnosis not present

## 2023-03-19 DIAGNOSIS — Z0000 Encounter for general adult medical examination without abnormal findings: Secondary | ICD-10-CM | POA: Diagnosis not present

## 2023-03-19 DIAGNOSIS — Z79899 Other long term (current) drug therapy: Secondary | ICD-10-CM | POA: Diagnosis not present

## 2023-03-19 DIAGNOSIS — Z125 Encounter for screening for malignant neoplasm of prostate: Secondary | ICD-10-CM | POA: Diagnosis not present

## 2023-03-19 DIAGNOSIS — Z1339 Encounter for screening examination for other mental health and behavioral disorders: Secondary | ICD-10-CM | POA: Diagnosis not present

## 2023-04-21 DIAGNOSIS — E1169 Type 2 diabetes mellitus with other specified complication: Secondary | ICD-10-CM | POA: Diagnosis not present

## 2023-04-21 DIAGNOSIS — Z299 Encounter for prophylactic measures, unspecified: Secondary | ICD-10-CM | POA: Diagnosis not present

## 2023-04-21 DIAGNOSIS — I10 Essential (primary) hypertension: Secondary | ICD-10-CM | POA: Diagnosis not present

## 2023-04-21 DIAGNOSIS — I69354 Hemiplegia and hemiparesis following cerebral infarction affecting left non-dominant side: Secondary | ICD-10-CM | POA: Diagnosis not present

## 2023-04-21 DIAGNOSIS — F0280 Dementia in other diseases classified elsewhere without behavioral disturbance: Secondary | ICD-10-CM | POA: Diagnosis not present

## 2023-05-05 DIAGNOSIS — M79671 Pain in right foot: Secondary | ICD-10-CM | POA: Diagnosis not present

## 2023-05-05 DIAGNOSIS — M79672 Pain in left foot: Secondary | ICD-10-CM | POA: Diagnosis not present

## 2023-05-05 DIAGNOSIS — E1140 Type 2 diabetes mellitus with diabetic neuropathy, unspecified: Secondary | ICD-10-CM | POA: Diagnosis not present

## 2023-05-05 DIAGNOSIS — L110 Acquired keratosis follicularis: Secondary | ICD-10-CM | POA: Diagnosis not present

## 2023-05-05 DIAGNOSIS — E1151 Type 2 diabetes mellitus with diabetic peripheral angiopathy without gangrene: Secondary | ICD-10-CM | POA: Diagnosis not present

## 2023-07-28 DIAGNOSIS — Z299 Encounter for prophylactic measures, unspecified: Secondary | ICD-10-CM | POA: Diagnosis not present

## 2023-07-28 DIAGNOSIS — I69354 Hemiplegia and hemiparesis following cerebral infarction affecting left non-dominant side: Secondary | ICD-10-CM | POA: Diagnosis not present

## 2023-07-28 DIAGNOSIS — M79609 Pain in unspecified limb: Secondary | ICD-10-CM | POA: Diagnosis not present

## 2023-07-28 DIAGNOSIS — F0280 Dementia in other diseases classified elsewhere without behavioral disturbance: Secondary | ICD-10-CM | POA: Diagnosis not present

## 2023-07-28 DIAGNOSIS — E1165 Type 2 diabetes mellitus with hyperglycemia: Secondary | ICD-10-CM | POA: Diagnosis not present

## 2023-07-28 DIAGNOSIS — I10 Essential (primary) hypertension: Secondary | ICD-10-CM | POA: Diagnosis not present

## 2023-08-07 ENCOUNTER — Emergency Department (HOSPITAL_COMMUNITY)

## 2023-08-07 ENCOUNTER — Encounter (HOSPITAL_COMMUNITY): Payer: Self-pay

## 2023-08-07 ENCOUNTER — Inpatient Hospital Stay (HOSPITAL_COMMUNITY)
Admission: EM | Admit: 2023-08-07 | Discharge: 2023-08-17 | DRG: 240 | Disposition: A | Discharge status: Skilled Nursing Facility | Attending: Internal Medicine | Admitting: Internal Medicine

## 2023-08-07 ENCOUNTER — Other Ambulatory Visit: Payer: Self-pay

## 2023-08-07 ENCOUNTER — Inpatient Hospital Stay (HOSPITAL_COMMUNITY)

## 2023-08-07 DIAGNOSIS — I10 Essential (primary) hypertension: Secondary | ICD-10-CM | POA: Diagnosis present

## 2023-08-07 DIAGNOSIS — E785 Hyperlipidemia, unspecified: Secondary | ICD-10-CM | POA: Diagnosis not present

## 2023-08-07 DIAGNOSIS — M272 Inflammatory conditions of jaws: Secondary | ICD-10-CM | POA: Insufficient documentation

## 2023-08-07 DIAGNOSIS — I70261 Atherosclerosis of native arteries of extremities with gangrene, right leg: Secondary | ICD-10-CM | POA: Diagnosis present

## 2023-08-07 DIAGNOSIS — F339 Major depressive disorder, recurrent, unspecified: Secondary | ICD-10-CM | POA: Diagnosis not present

## 2023-08-07 DIAGNOSIS — D62 Acute posthemorrhagic anemia: Secondary | ICD-10-CM | POA: Diagnosis not present

## 2023-08-07 DIAGNOSIS — Z7982 Long term (current) use of aspirin: Secondary | ICD-10-CM | POA: Diagnosis not present

## 2023-08-07 DIAGNOSIS — R531 Weakness: Secondary | ICD-10-CM | POA: Diagnosis not present

## 2023-08-07 DIAGNOSIS — F0150 Vascular dementia without behavioral disturbance: Secondary | ICD-10-CM | POA: Diagnosis not present

## 2023-08-07 DIAGNOSIS — R471 Dysarthria and anarthria: Secondary | ICD-10-CM | POA: Diagnosis present

## 2023-08-07 DIAGNOSIS — E7800 Pure hypercholesterolemia, unspecified: Secondary | ICD-10-CM | POA: Diagnosis not present

## 2023-08-07 DIAGNOSIS — I69354 Hemiplegia and hemiparesis following cerebral infarction affecting left non-dominant side: Secondary | ICD-10-CM | POA: Diagnosis not present

## 2023-08-07 DIAGNOSIS — E1169 Type 2 diabetes mellitus with other specified complication: Secondary | ICD-10-CM | POA: Diagnosis present

## 2023-08-07 DIAGNOSIS — E1152 Type 2 diabetes mellitus with diabetic peripheral angiopathy with gangrene: Principal | ICD-10-CM | POA: Diagnosis present

## 2023-08-07 DIAGNOSIS — Z7401 Bed confinement status: Secondary | ICD-10-CM | POA: Diagnosis not present

## 2023-08-07 DIAGNOSIS — Z8249 Family history of ischemic heart disease and other diseases of the circulatory system: Secondary | ICD-10-CM

## 2023-08-07 DIAGNOSIS — R54 Age-related physical debility: Secondary | ICD-10-CM | POA: Diagnosis not present

## 2023-08-07 DIAGNOSIS — Z89512 Acquired absence of left leg below knee: Secondary | ICD-10-CM

## 2023-08-07 DIAGNOSIS — M79672 Pain in left foot: Secondary | ICD-10-CM | POA: Diagnosis not present

## 2023-08-07 DIAGNOSIS — Z7902 Long term (current) use of antithrombotics/antiplatelets: Secondary | ICD-10-CM | POA: Diagnosis not present

## 2023-08-07 DIAGNOSIS — Z811 Family history of alcohol abuse and dependence: Secondary | ICD-10-CM | POA: Diagnosis not present

## 2023-08-07 DIAGNOSIS — Z66 Do not resuscitate: Secondary | ICD-10-CM | POA: Diagnosis present

## 2023-08-07 DIAGNOSIS — K219 Gastro-esophageal reflux disease without esophagitis: Secondary | ICD-10-CM | POA: Diagnosis not present

## 2023-08-07 DIAGNOSIS — M86171 Other acute osteomyelitis, right ankle and foot: Secondary | ICD-10-CM | POA: Diagnosis not present

## 2023-08-07 DIAGNOSIS — M79671 Pain in right foot: Secondary | ICD-10-CM | POA: Diagnosis not present

## 2023-08-07 DIAGNOSIS — I70209 Unspecified atherosclerosis of native arteries of extremities, unspecified extremity: Secondary | ICD-10-CM | POA: Diagnosis not present

## 2023-08-07 DIAGNOSIS — M6281 Muscle weakness (generalized): Secondary | ICD-10-CM | POA: Diagnosis not present

## 2023-08-07 DIAGNOSIS — Z7984 Long term (current) use of oral hypoglycemic drugs: Secondary | ICD-10-CM | POA: Diagnosis not present

## 2023-08-07 DIAGNOSIS — Z89421 Acquired absence of other right toe(s): Secondary | ICD-10-CM

## 2023-08-07 DIAGNOSIS — R41841 Cognitive communication deficit: Secondary | ICD-10-CM | POA: Diagnosis not present

## 2023-08-07 DIAGNOSIS — Z4781 Encounter for orthopedic aftercare following surgical amputation: Secondary | ICD-10-CM | POA: Diagnosis not present

## 2023-08-07 DIAGNOSIS — E1151 Type 2 diabetes mellitus with diabetic peripheral angiopathy without gangrene: Secondary | ICD-10-CM | POA: Diagnosis not present

## 2023-08-07 DIAGNOSIS — I739 Peripheral vascular disease, unspecified: Secondary | ICD-10-CM | POA: Diagnosis present

## 2023-08-07 DIAGNOSIS — L110 Acquired keratosis follicularis: Secondary | ICD-10-CM | POA: Diagnosis not present

## 2023-08-07 DIAGNOSIS — Z9714 Presence of artificial left leg (complete) (partial): Secondary | ICD-10-CM

## 2023-08-07 DIAGNOSIS — L089 Local infection of the skin and subcutaneous tissue, unspecified: Secondary | ICD-10-CM | POA: Diagnosis not present

## 2023-08-07 DIAGNOSIS — M869 Osteomyelitis, unspecified: Secondary | ICD-10-CM | POA: Diagnosis not present

## 2023-08-07 DIAGNOSIS — S91104A Unspecified open wound of right lesser toe(s) without damage to nail, initial encounter: Secondary | ICD-10-CM | POA: Diagnosis not present

## 2023-08-07 DIAGNOSIS — Z833 Family history of diabetes mellitus: Secondary | ICD-10-CM

## 2023-08-07 DIAGNOSIS — Z79899 Other long term (current) drug therapy: Secondary | ICD-10-CM | POA: Diagnosis not present

## 2023-08-07 DIAGNOSIS — I70235 Atherosclerosis of native arteries of right leg with ulceration of other part of foot: Secondary | ICD-10-CM | POA: Diagnosis not present

## 2023-08-07 DIAGNOSIS — M8950 Osteolysis, unspecified site: Secondary | ICD-10-CM | POA: Diagnosis not present

## 2023-08-07 DIAGNOSIS — R1312 Dysphagia, oropharyngeal phase: Secondary | ICD-10-CM | POA: Diagnosis not present

## 2023-08-07 DIAGNOSIS — E1140 Type 2 diabetes mellitus with diabetic neuropathy, unspecified: Secondary | ICD-10-CM | POA: Diagnosis not present

## 2023-08-07 DIAGNOSIS — R2689 Other abnormalities of gait and mobility: Secondary | ICD-10-CM | POA: Diagnosis not present

## 2023-08-07 DIAGNOSIS — E441 Mild protein-calorie malnutrition: Secondary | ICD-10-CM | POA: Diagnosis not present

## 2023-08-07 DIAGNOSIS — M24574 Contracture, right foot: Secondary | ICD-10-CM | POA: Diagnosis not present

## 2023-08-07 DIAGNOSIS — Z9862 Peripheral vascular angioplasty status: Secondary | ICD-10-CM | POA: Diagnosis not present

## 2023-08-07 DIAGNOSIS — L97519 Non-pressure chronic ulcer of other part of right foot with unspecified severity: Secondary | ICD-10-CM | POA: Diagnosis not present

## 2023-08-07 DIAGNOSIS — F32A Depression, unspecified: Secondary | ICD-10-CM | POA: Diagnosis not present

## 2023-08-07 LAB — CBC
HCT: 37.4 % — ABNORMAL LOW (ref 39.0–52.0)
Hemoglobin: 12.3 g/dL — ABNORMAL LOW (ref 13.0–17.0)
MCH: 29.6 pg (ref 26.0–34.0)
MCHC: 32.9 g/dL (ref 30.0–36.0)
MCV: 90.1 fL (ref 80.0–100.0)
Platelets: 183 K/uL (ref 150–400)
RBC: 4.15 MIL/uL — ABNORMAL LOW (ref 4.22–5.81)
RDW: 12.7 % (ref 11.5–15.5)
WBC: 7.6 K/uL (ref 4.0–10.5)
nRBC: 0 % (ref 0.0–0.2)

## 2023-08-07 LAB — BASIC METABOLIC PANEL WITH GFR
Anion gap: 10 (ref 5–15)
BUN: 18 mg/dL (ref 8–23)
CO2: 28 mmol/L (ref 22–32)
Calcium: 9 mg/dL (ref 8.9–10.3)
Chloride: 102 mmol/L (ref 98–111)
Creatinine, Ser: 0.77 mg/dL (ref 0.61–1.24)
GFR, Estimated: >60 mL/min (ref >60)
Glucose, Bld: 151 mg/dL — ABNORMAL HIGH (ref 70–99)
Potassium: 4 mmol/L (ref 3.5–5.1)
Sodium: 140 mmol/L (ref 135–145)

## 2023-08-07 LAB — HEMOGLOBIN A1C
Hgb A1c MFr Bld: 6.2 % — ABNORMAL HIGH (ref 4.8–5.6)
Mean Plasma Glucose: 131.24 mg/dL

## 2023-08-07 LAB — CBG MONITORING, ED: Glucose-Capillary: 173 mg/dL — ABNORMAL HIGH (ref 70–99)

## 2023-08-07 MED ORDER — HEPARIN SODIUM (PORCINE) 5000 UNIT/ML IJ SOLN: 5000.0000 [IU] | Freq: Three times a day (TID) | INTRAMUSCULAR | Status: DC

## 2023-08-07 MED ORDER — MELATONIN 3 MG PO TABS
9.0000 mg | ORAL_TABLET | Freq: Every evening | ORAL | Status: DC | PRN
Start: 2023-08-07 — End: 2023-08-17
  Administered 2023-08-14: 9 mg via ORAL
  Filled 2023-08-07 (×2): qty 3

## 2023-08-07 MED ORDER — PANTOPRAZOLE SODIUM 20 MG PO TBEC: 20.0000 mg | DELAYED_RELEASE_TABLET | Freq: Every day | ORAL | Status: DC | PRN

## 2023-08-07 MED ORDER — ACETAMINOPHEN 325 MG PO TABS
650.0000 mg | ORAL_TABLET | Freq: Four times a day (QID) | ORAL | Status: DC | PRN
  Administered 2023-08-08 – 2023-08-13 (×3): 650 mg via ORAL
  Filled 2023-08-07 (×3): qty 2

## 2023-08-07 MED ORDER — ACETAMINOPHEN 650 MG RE SUPP: 650.0000 mg | Freq: Four times a day (QID) | RECTAL | Status: DC | PRN

## 2023-08-07 MED ORDER — VANCOMYCIN HCL IN DEXTROSE 1-5 GM/200ML-% IV SOLN
1000.0000 mg | Freq: Once | INTRAVENOUS | Status: AC
  Administered 2023-08-07: 1000 mg via INTRAVENOUS
  Filled 2023-08-07: qty 200

## 2023-08-07 MED ORDER — METOPROLOL TARTRATE 25 MG PO TABS
25.0000 mg | ORAL_TABLET | Freq: Every day | ORAL | Status: DC
Start: 2023-08-07 — End: 2023-08-17
  Administered 2023-08-07 – 2023-08-17 (×10): 25 mg via ORAL
  Filled 2023-08-07 (×10): qty 1

## 2023-08-07 MED ORDER — CEFAZOLIN SODIUM-DEXTROSE 2-4 GM/100ML-% IV SOLN
2.0000 g | Freq: Three times a day (TID) | INTRAVENOUS | Status: AC
  Administered 2023-08-07 – 2023-08-14 (×20): 2 g via INTRAVENOUS
  Filled 2023-08-07 (×20): qty 100

## 2023-08-07 MED ORDER — HYDRALAZINE HCL 20 MG/ML IJ SOLN: 5.0000 mg | INTRAMUSCULAR | Status: DC | PRN

## 2023-08-07 MED ORDER — DOXYCYCLINE HYCLATE 100 MG PO TABS
100.0000 mg | ORAL_TABLET | Freq: Two times a day (BID) | ORAL | Status: DC
  Administered 2023-08-08 – 2023-08-12 (×9): 100 mg via ORAL
  Filled 2023-08-07 (×9): qty 1

## 2023-08-07 MED ORDER — PANTOPRAZOLE SODIUM 40 MG PO TBEC: 40.0000 mg | DELAYED_RELEASE_TABLET | Freq: Every day | ORAL | Status: DC | PRN

## 2023-08-07 MED ORDER — FLUOXETINE HCL 20 MG PO CAPS
20.0000 mg | ORAL_CAPSULE | Freq: Every day | ORAL | Status: DC
  Administered 2023-08-07 – 2023-08-16 (×10): 20 mg via ORAL
  Filled 2023-08-07 (×10): qty 1

## 2023-08-07 MED ORDER — ROSUVASTATIN CALCIUM 5 MG PO TABS
5.0000 mg | ORAL_TABLET | ORAL | Status: DC
  Administered 2023-08-07 – 2023-08-17 (×4): 5 mg via ORAL
  Filled 2023-08-07 (×5): qty 1

## 2023-08-07 MED ORDER — INSULIN ASPART 100 UNIT/ML IJ SOLN
0.0000 [IU] | Freq: Three times a day (TID) | INTRAMUSCULAR | Status: DC
  Administered 2023-08-08: 1 [IU] via SUBCUTANEOUS
  Administered 2023-08-08: 2 [IU] via SUBCUTANEOUS
  Administered 2023-08-09 (×2): 1 [IU] via SUBCUTANEOUS
  Administered 2023-08-10: 2 [IU] via SUBCUTANEOUS
  Administered 2023-08-10 – 2023-08-13 (×4): 1 [IU] via SUBCUTANEOUS
  Administered 2023-08-14: 2 [IU] via SUBCUTANEOUS
  Administered 2023-08-14 – 2023-08-15 (×2): 1 [IU] via SUBCUTANEOUS
  Administered 2023-08-15 – 2023-08-16 (×2): 2 [IU] via SUBCUTANEOUS
  Administered 2023-08-16 – 2023-08-17 (×4): 1 [IU] via SUBCUTANEOUS
  Filled 2023-08-07: qty 1

## 2023-08-07 MED ORDER — GABAPENTIN 100 MG PO CAPS
100.0000 mg | ORAL_CAPSULE | Freq: Every day | ORAL | Status: DC
  Administered 2023-08-07 – 2023-08-16 (×10): 100 mg via ORAL
  Filled 2023-08-07 (×11): qty 1

## 2023-08-07 MED ORDER — ALBUTEROL SULFATE (2.5 MG/3ML) 0.083% IN NEBU: 2.5000 mg | INHALATION_SOLUTION | RESPIRATORY_TRACT | Status: DC | PRN

## 2023-08-07 NOTE — ED Provider Notes (Signed)
 [place] EMERGENCY DEPARTMENT AT [facility] Provider Note   CSN: [id number] Arrival date & time: 08/07/23  1258     Patient presents with: Toe Injury   Tylan Kinn is a 73 y.o. male.   73 year old male presenting with right fifth toe injury.  Patient is accompanied by his wife who provides collateral information, she states that about a week ago his toe split open, no known inciting event, patient mentions that he does not have the best sensation in his feet and denies pain.  Patient has had multiple toes on the same foot amputated previously, history of poorly-controlled diabetes.  No known fevers at home, however his wife notes at times his shirt was wet as if he were sweating.        Prior to Admission medications   Medication Sig Start Date End Date Taking? Authorizing Provider  ARIPiprazole  (ABILIFY ) 5 MG tablet Take 1 tablet (5 mg total) by mouth at bedtime. 08/27/22   Okey Barnie SAUNDERS, MD  clopidogrel  (PLAVIX ) 75 MG tablet Take 1 tablet (75 mg total) by mouth daily. 07/06/12   Nicholaus Leita LABOR, NP  FLUoxetine  (PROZAC ) 20 MG capsule Take 1 capsule (20 mg total) by mouth every morning. 08/27/22   Okey Barnie SAUNDERS, MD  glipiZIDE (GLUCOTROL) 5 MG tablet Take 5 mg by mouth daily. 10/15/18   [provider]  ipratropium (ATROVENT) 0.03 % nasal spray SMARTSIG:2 Spray(s) Both Nares Every 12 Hours PRN 02/22/22   [provider]  lipase/protease/amylase (CREON ) 36000 UNITS CPEP capsule Take 2 capsules (72,000 Units total) by mouth 3 (three) times daily with meals. 1 capsule with snacks Patient not taking: Reported on 03/11/2022 12/25/18   Shirlean Therisa ORN, NP  loperamide (IMODIUM) 2 MG capsule Take by mouth as needed for diarrhea or loose stools.    [provider]  Melatonin 5 MG CAPS Take 10 mg by mouth at bedtime as needed (sleep).    [provider]  meloxicam (MOBIC) 15 MG tablet Take 15 mg by mouth daily as needed. 02/21/22   [provider]  ondansetron  (ZOFRAN ) 4 MG tablet Take by mouth.    [provider]  pantoprazole  (PROTONIX ) 20 MG tablet Take 1 tablet by mouth daily.    [provider]  rosuvastatin  (CRESTOR ) 5 MG tablet Take 5 mg by mouth every other day. 12/11/21   [provider]    Allergies: Patient has no known allergies.    Review of Systems  Updated Vital Signs BP 115/64 (BP Location: Right Arm)   Pulse 60   Temp 97.6 F (36.4 C) (Oral)   Resp 18   Ht 5' 9.5 (1.765 m)   Wt 68.6 kg   SpO2 97%   BMI 22.01 kg/m     Physical Exam Vitals and nursing note reviewed.  HENT:     Head: Normocephalic.  Eyes:     Extraocular Movements: Extraocular movements intact.  Cardiovascular:     Rate and Rhythm: Normal rate.  Pulmonary:     Effort: Pulmonary effort is normal.  Musculoskeletal:     Cervical back: Normal range of motion.     Comments: Moves all extremities spontaneous without difficulty Abnormality of R 5th toe, see photo  Skin:    General: Skin is warm and dry.  Neurological:     Mental Status: He is alert and oriented to person, place, and time.     (all labs ordered are listed, but only abnormal results are  displayed) Labs Reviewed  CBC - Abnormal; Notable for the following components:      Result Value   RBC 4.15 (*)    Hemoglobin 12.3 (*)    HCT 37.4 (*)    All other components within normal limits  BASIC METABOLIC PANEL WITH GFR - Abnormal; Notable for the following components:   Glucose, Bld 151 (*)    All other components within normal limits  CULTURE, BLOOD (ROUTINE X 2)  CULTURE, BLOOD (ROUTINE X 2)    EKG: None  Radiology: DG Toe 5th Right Result Date: 08/07/2023 CLINICAL DATA:  Infection.  Toe contractures. EXAM: RIGHT FIFTH TOE COMPARISON:  Radiographs 05/15/2018 and 05/13/2018. FINDINGS: There is new osteolysis and fragmentation of the head of the 5th proximal phalanx, suspicious for osteomyelitis in the appropriate clinical  context. The 5th toe otherwise appears intact with a stable clawtoe deformity. There are stable postsurgical changes from previous amputation of the 4th toe and head of the 4th metatarsal. No other changes are identified. IMPRESSION: New osteolysis and fragmentation of the head of the 5th proximal phalanx, suspicious for osteomyelitis in the appropriate clinical context. Electronically Signed   By: Elsie Perone M.D.   On: 08/07/2023 14:39     Procedures   Medications Ordered in the ED - No data to display                                  Medical Decision Making This patient presents to the ED for concern of toe infection, this involves an extensive number of treatment options, and is a complaint that carries with it a high risk of complications and morbidity.  The differential diagnosis includes abscess, infection, osteomyelitis, sepsis.   Co morbidities that complicate the patient evaluation  Diabetes, history of multiple toe amputations   Additional history obtained:  Additional history obtained from record review External records from outside source obtained and reviewed including recent radiology notes   Lab Tests:  I Ordered, and personally interpreted labs.  The pertinent results include: CBC notable for anemia with hemoglobin of 12.3, this is improved from most recent labs that we have available.  BMP notable for hyperglycemia but otherwise unremarkable.   Imaging Studies ordered:  I ordered imaging studies including XR R 5th digit  I independently visualized and interpreted imaging which showed New osteolysis and fragmentation of the head of the 5th proximalphalanx, suspicious for osteomyelitis in the appropriate clinical context.  I agree with the radiologist interpretation   Cardiac Monitoring: / EKG:  The patient was maintained on a cardiac monitor.  I personally viewed and interpreted the cardiac monitored which showed an underlying rhythm of:  NSR   Consultations Obtained:  Spoke with Dr. Celena with ortho who will discuss the patient's case with Dr. Harden for possible amputation tomorrow, patient will require transport to [facility]. Spoke with Dr. Sherlon with the hospitalist service who agrees that this patient is appropriate for admission.   Problem List / ED Course / Critical interventions / Medication management  I ordered medication including Vancomycin   for osteomyelitis  I have reviewed the patients home medicines and have made adjustments as needed   Test / Admission - Considered:  Physical exam notable as above.  X-ray imaging is concerning for osteomyelitis, labs are largely unremarkable, patient is well-appearing and not demonstrating any signs of systemic illness, he is afebrile, not tachycardic, no leukocytosis on CBC.  Spoke with  Dr. Celena with orthopedics who has discussed this patient's case with Dr. Harden, see above.  Spoke with the hospitalist service who agrees that this patient is appropriate for admission and will likely be transferred to [facility] for further surgical management of his osteomyelitis.  He is appropriate for admission at this time.    Amount and/or Complexity of Data Reviewed Labs: ordered. Radiology: ordered.  Risk Prescription drug management. Decision regarding hospitalization.        Final diagnoses:  Osteomyelitis of right foot, unspecified type [facility])    ED Discharge Orders     None          Glendia Rocky LOISE DEVONNA 08/07/23 1721    Cleotilde Rogue, MD 08/08/23 1730

## 2023-08-07 NOTE — ED Notes (Signed)
Food provided for pt

## 2023-08-07 NOTE — ED Triage Notes (Signed)
 Pt arrived via POV for further evaluation for right 5th toe injury and possible infection. Pt advised to seek evaluation from the podiatrist for possible concern of osteomyelitis. Pt presents with unstageable injury to his little toe.

## 2023-08-07 NOTE — Progress Notes (Addendum)
 Consult received.   Given the nature, extent, and duration of the wound as well as the patient's comorbidities, I have asked my colleague, Dr. Jerona Sage, for his assistance with further assessment and management given his expertise and experience in this domain. Either Dr. Duda or I will evaluate the patient tomorrow if patient is transferred. Amputation is likely but fortunately patient is medically stable at this time.  Dr. Sage has graciously agreed to see the patient tomorrow if sent down or as an outpatient if appropriate.  Chad Bruch, MD [facility], [facility] [phone]

## 2023-08-07 NOTE — H&P (Addendum)
 [facility] H&P   Patient Demographics:    Chad Grant, is a 73 y.o. male  MRN: [id number]   DOB - 1950/04/19  Admit Date - 08/07/2023  Outpatient Primary MD for the patient is Maree Isles, MD  Referring MD/NP/PA: PA Dorothyann  Patient coming from: Home  Chief Complaint  Patient presents with   Toe Injury      HPI:    Chad Grant  is a 73 y.o. male, with past medical history of peripheral vascular disease status post left BKA 2010 in [place], 3rd and 4th right toe amputation 2014 by Dr. Serene, history of CVA, with residual left-sided weakness, vascular dementia, diabetes mellitus, hyperlipidemia, wife at bedside provide history as patient is pleasantly demented with dysarthria. - Reports progressive right fifth great toe wound over the last few weeks, but has significant discoloration over the last 2 weeks, and does not have the sensation of his feet, denies pain, no fever, no chills per his wife, he usually ambulate slowly with walker and left leg prosthesis - ED he was afebrile, with no leukocytosis, but x-ray of right fifth toe showing significant osteomyelitis, ED discussed with orthopedic on-call, there is likelihood he will need an amputation, so transfer requested to [facility]   Review of systems:    The patient is poor historian, cannot provide reliable review of system  With Past History of the following :    Past Medical History:  Diagnosis Date   Dementia (HCC)    Depression    Diabetes mellitus type II    Hypercholesteremia    Hypertension    S/P BKA (below knee amputation) unilateral (HCC)    Stroke (HCC) 2013   left sided weakness   TIA (transient ischemic attack)       Past Surgical History:  Procedure Laterality Date   ABDOMINAL AORTAGRAM N/A 04/29/2012   Procedure: ABDOMINAL EZELLA;  Surgeon: Gaile LELON Serene, MD;  Location: [facility] CATH  LAB;  Service: Cardiovascular;  Laterality: N/A;   AMPUTATION Right 05/15/2018   Procedure: AMPUTATION RIGHT THIRD TOE;  Surgeon: Blinda Katz, DPM;  Location: [facility] ORS;  Service: Podiatry;  Laterality: Right;   CATARACT EXTRACTION W/PHACO Left 12/04/2015   Procedure: CATARACT EXTRACTION PHACO AND INTRAOCULAR LENS PLACEMENT LEFT EYE CDE=19.92;  Surgeon: Cherene Mania, MD;  Location: [facility] ORS;  Service: Ophthalmology;  Laterality: Left;  left   cataract removed Bilateral    left leg amputa     BKA   left leg amputated     SHOULDER SURGERY Right    rotator cuff      Social History:     Social History   Tobacco Use   Smoking status: Never    Passive exposure: Never   Smokeless tobacco: Never  Substance Use Topics   Alcohol  use: No    Comment: in the past but not since amputation in 2009, drank alcohol   on weekends       Family History :     Family History  Problem Relation Age of Onset   Heart disease Mother    Diabetes Father    Alcohol  abuse Brother    Diabetes Brother    Diabetes Sister    Peripheral vascular disease Sister        amputation   Diabetes Daughter    Anxiety disorder Neg Hx    Bipolar disorder Neg Hx    Dementia Neg Hx    Depression Neg Hx    Drug abuse Neg Hx    Paranoid behavior Neg Hx    Schizophrenia Neg Hx    OCD Neg Hx    Seizures Neg Hx    Sexual abuse Neg Hx    Physical abuse Neg Hx    ADD / ADHD Neg Hx    Colon cancer Neg Hx    Colon polyps Neg Hx       Home Medications:   Prior to Admission medications   Medication Sig Start Date End Date Taking? Authorizing Provider  acetaminophen  (TYLENOL ) 500 MG tablet Take 500 mg by mouth every 6 (six) hours as needed for mild pain (pain score 1-3).   Yes [provider]  clopidogrel  (PLAVIX ) 75 MG tablet Take 1 tablet (75 mg total) by mouth daily. 07/06/12  Yes Nicholaus Leita LABOR, NP  FLUoxetine  (PROZAC ) 20 MG capsule Take 1 capsule (20 mg total) by mouth every morning. Patient taking  differently: Take 20 mg by mouth at bedtime. 08/27/22  Yes Okey Barnie SAUNDERS, MD  gabapentin  (NEURONTIN ) 100 MG capsule Take 100 mg by mouth at bedtime. 07/28/23  Yes [provider]  glipiZIDE (GLUCOTROL) 5 MG tablet Take 5 mg by mouth daily. 10/15/18  Yes [provider]  loperamide (IMODIUM) 2 MG capsule Take 2 mg by mouth as needed for diarrhea or loose stools.   Yes [provider]  Melatonin 5 MG CAPS Take 10 mg by mouth at bedtime as needed (sleep).   Yes [provider]  metoprolol  tartrate (LOPRESSOR ) 25 MG tablet Take 25 mg by mouth daily.   Yes [provider]  pantoprazole  (PROTONIX ) 20 MG tablet Take 1 tablet by mouth daily as needed for heartburn or indigestion.   Yes [provider]  rosuvastatin  (CRESTOR ) 5 MG tablet Take 5 mg by mouth every other day. 12/11/21  Yes [provider]  ARIPiprazole  (ABILIFY ) 5 MG tablet Take 1 tablet (5 mg total) by mouth at bedtime. Patient not taking: Reported on 08/07/2023 08/27/22   Okey Barnie SAUNDERS, MD  ipratropium (ATROVENT) 0.03 % nasal spray Place 2 sprays into both nostrils every 12 (twelve) hours as needed for rhinitis. Patient not taking: Reported on 08/07/2023 02/22/22   [provider]  meloxicam (MOBIC) 15 MG tablet Take 15 mg by mouth daily as needed for pain. Patient not taking: Reported on 08/07/2023 02/21/22   [provider]  ondansetron  (ZOFRAN ) 4 MG tablet Take 4 mg by mouth every 8 (eight) hours as needed for nausea or vomiting. Patient not taking: Reported on 08/07/2023    [provider]     Allergies:    No Known Allergies   Physical Exam:   Vitals  Blood pressure 115/64, pulse 60, temperature 97.6 F (36.4 C), temperature source Oral, resp. rate 18, height 5' 9.5 (1.765 m), weight 68.6 kg, SpO2 97%.   1. General Frail, currently appearing male, laying in bed, no apparent distress  2.  Jent, no apparent distress, impaired judgment and  insight, demented, oriented x 2    3.  Difficult dysarthria, slow speech, but overall comprehendible has chronic left-sided weakness   4. Ears and Eyes appear Normal, Conjunctivae clear, PERRLA. Moist Oral Mucosa.  5. Supple Neck, No JVD, No cervical lymphadenopathy appriciated, No Carotid Bruits.  6. Symmetrical Chest wall movement, Good air movement bilaterally, CTAB.  7. RRR, No Gallops, Rubs or Murmurs, No Parasternal Heave.  8. Positive Bowel Sounds, Abdomen Soft, No tenderness, No organomegaly appriciated,No rebound -guarding or rigidity.  9.  Left BKA, right 3rd and 4th toe amputation, diminished pulses, right fifth great toe with discoloration and gangrene     Data Review:    CBC Recent Labs  Lab 08/07/23 1535  WBC 7.6  HGB 12.3*  HCT 37.4*  PLT 183  MCV 90.1  MCH 29.6  MCHC 32.9  RDW 12.7   ------------------------------------------------------------------------------------------------------------------  Chemistries  Recent Labs  Lab 08/07/23 1535  NA 140  K 4.0  CL 102  CO2 28  GLUCOSE 151*  BUN 18  CREATININE 0.77  CALCIUM  9.0   ------------------------------------------------------------------------------------------------------------------ estimated creatinine clearance is 78.6 mL/min (by C-G formula based on SCr of 0.77 mg/dL). ------------------------------------------------------------------------------------------------------------------ No results for input(s): TSH, T4TOTAL, T3FREE, THYROIDAB in the last 72 hours.  Invalid input(s): FREET3  Coagulation profile No results for input(s): INR, PROTIME in the last 168 hours. ------------------------------------------------------------------------------------------------------------------- No results for input(s): DDIMER in the last 72 hours. -------------------------------------------------------------------------------------------------------------------  Cardiac Enzymes No  results for input(s): CKMB, TROPONINI, MYOGLOBIN in the last 168 hours.  Invalid input(s): CK ------------------------------------------------------------------------------------------------------------------ No results found for: BNP   ---------------------------------------------------------------------------------------------------------------  Urinalysis No results found for: COLORURINE, APPEARANCEUR, LABSPEC, PHURINE, GLUCOSEU, HGBUR, BILIRUBINUR, KETONESUR, PROTEINUR, UROBILINOGEN, NITRITE, LEUKOCYTESUR  ----------------------------------------------------------------------------------------------------------------   Imaging Results:    DG Toe 5th Right Result Date: 08/07/2023 CLINICAL DATA:  Infection.  Toe contractures. EXAM: RIGHT FIFTH TOE COMPARISON:  Radiographs 05/15/2018 and 05/13/2018. FINDINGS: There is new osteolysis and fragmentation of the head of the 5th proximal phalanx, suspicious for osteomyelitis in the appropriate clinical context. The 5th toe otherwise appears intact with a stable clawtoe deformity. There are stable postsurgical changes from previous amputation of the 4th toe and head of the 4th metatarsal. No other changes are identified. IMPRESSION: New osteolysis and fragmentation of the head of the 5th proximal phalanx, suspicious for osteomyelitis in the appropriate clinical context. Electronically Signed   By: Elsie Perone M.D.   On: 08/07/2023 14:39     Assessment & Plan:    Principal Problem:   Osteomyelitis (HCC) Active Problems:   HTN (hypertension)   Diabetes type 2 with atherosclerosis of arteries of extremities (HCC)    Right fifth toe osteomyelitis - Currently he is afebrile, with no leukocytosis, but toe looks significantly infected, high risk, so he will be admitted for further management - Ceftriaxone vancomycin  in ED, so for now we will continue on IV cefazolin  and doxycycline . - Patient to be  transferred to [facility] to be evaluated by orthopedic as likely will need amputation, Dr. Louann. Harden to evaluate at [facility], timing for surgery after them. - Obtain ABI, had angiogram with no significant disease, but this was in 2015, 10 years ago, he will need ABI, was ordered as stat - Continue with Tylenol  for pain as appears controlled  History of CVA chronic left-sided weakness -Consult PT, OT -Hold Plavix  today for possible need of surgery -Continue with statin -Consult PT, OT  Diabetes mellitus -Hold glipizide, will check A1c, and will keep an insulin  sliding scale  Hypertension - Continue with home medication include metoprolol   GERD -Continue with PPI  Vascular dementia - Continue with supportive care,    DVT Prophylaxis Heparin   AM Labs Ordered, also please review Full Orders  Family Communication: Admission, patients condition and plan of care including tests being ordered have been discussed with the patient and wife at bedside who indicate understanding and agree with the plan and Code Status.  Code Status DNR, wife understand it will be suspended while in surgery  Likely DC to home  Consults called: Orthopedic Dr. Louann. Harden  Admission status: Inpatient  Time spent in minutes : 70 minutes   Brayton Lye M.D on 08/07/2023 at 5:25 PM   [facility] - Office  [phone]

## 2023-08-08 DIAGNOSIS — I10 Essential (primary) hypertension: Secondary | ICD-10-CM | POA: Diagnosis not present

## 2023-08-08 DIAGNOSIS — M869 Osteomyelitis, unspecified: Secondary | ICD-10-CM | POA: Diagnosis not present

## 2023-08-08 DIAGNOSIS — I70209 Unspecified atherosclerosis of native arteries of extremities, unspecified extremity: Secondary | ICD-10-CM

## 2023-08-08 DIAGNOSIS — I739 Peripheral vascular disease, unspecified: Secondary | ICD-10-CM | POA: Diagnosis not present

## 2023-08-08 DIAGNOSIS — E1151 Type 2 diabetes mellitus with diabetic peripheral angiopathy without gangrene: Secondary | ICD-10-CM

## 2023-08-08 LAB — BASIC METABOLIC PANEL WITH GFR
Anion gap: 9 (ref 5–15)
BUN: 15 mg/dL (ref 8–23)
CO2: 27 mmol/L (ref 22–32)
Calcium: 8.8 mg/dL — ABNORMAL LOW (ref 8.9–10.3)
Chloride: 101 mmol/L (ref 98–111)
Creatinine, Ser: 0.71 mg/dL (ref 0.61–1.24)
GFR, Estimated: >60 mL/min (ref >60)
Glucose, Bld: 137 mg/dL — ABNORMAL HIGH (ref 70–99)
Potassium: 3.9 mmol/L (ref 3.5–5.1)
Sodium: 137 mmol/L (ref 135–145)

## 2023-08-08 LAB — GLUCOSE, CAPILLARY
Glucose-Capillary: 97 mg/dL (ref 70–99)
Glucose-Capillary: 161 mg/dL — ABNORMAL HIGH (ref 70–99)
Glucose-Capillary: 145 mg/dL — ABNORMAL HIGH (ref 70–99)

## 2023-08-08 LAB — CBC
HCT: 38.6 % — ABNORMAL LOW (ref 39.0–52.0)
Hemoglobin: 13 g/dL (ref 13.0–17.0)
MCH: 29.9 pg (ref 26.0–34.0)
MCHC: 33.7 g/dL (ref 30.0–36.0)
MCV: 88.7 fL (ref 80.0–100.0)
Platelets: 168 K/uL (ref 150–400)
RBC: 4.35 MIL/uL (ref 4.22–5.81)
RDW: 12.6 % (ref 11.5–15.5)
WBC: 7.2 K/uL (ref 4.0–10.5)
nRBC: 0 % (ref 0.0–0.2)

## 2023-08-08 LAB — CBG MONITORING, ED: Glucose-Capillary: 125 mg/dL — ABNORMAL HIGH (ref 70–99)

## 2023-08-08 MED ORDER — HEPARIN SODIUM (PORCINE) 5000 UNIT/ML IJ SOLN
5000.0000 [IU] | Freq: Three times a day (TID) | INTRAMUSCULAR | Status: DC
  Administered 2023-08-08 – 2023-08-17 (×26): 5000 [IU] via SUBCUTANEOUS
  Filled 2023-08-08 (×26): qty 1

## 2023-08-08 NOTE — ED Notes (Signed)
 Pt had a bowel movement at this time. Pt cleaned, sheet changed, and new brief applied. Pt provided warm blanket, pt has no complaints at this time.

## 2023-08-08 NOTE — Consult Note (Signed)
 ORTHOPAEDIC CONSULTATION  REQUESTING PHYSICIAN: Rosario Leatrice FERNS, MD  Chief Complaint: ulceration and osteomyelitis right little toe.  HPI: Chad Grant is a 73 y.o. male who presents with ischemic ulceration and osteomyelitis right little toe.  Patient has a history of diabetes status post a left transtibial amputation.  Patient has undergone arterial studies in 2014 with vascular surgery.  Past Medical History:  Diagnosis Date   Dementia (HCC)    Depression    Diabetes mellitus type II    Hypercholesteremia    Hypertension    S/P BKA (below knee amputation) unilateral (HCC)    Stroke (HCC) 2013   left sided weakness   TIA (transient ischemic attack)    Past Surgical History:  Procedure Laterality Date   ABDOMINAL AORTAGRAM N/A 04/29/2012   Procedure: ABDOMINAL EZELLA;  Surgeon: Gaile LELON New, MD;  Location: [facility] CATH LAB;  Service: Cardiovascular;  Laterality: N/A;   AMPUTATION Right 05/15/2018   Procedure: AMPUTATION RIGHT THIRD TOE;  Surgeon: Blinda Katz, DPM;  Location: [facility] ORS;  Service: Podiatry;  Laterality: Right;   CATARACT EXTRACTION W/PHACO Left 12/04/2015   Procedure: CATARACT EXTRACTION PHACO AND INTRAOCULAR LENS PLACEMENT LEFT EYE CDE=19.92;  Surgeon: Cherene Mania, MD;  Location: [facility] ORS;  Service: Ophthalmology;  Laterality: Left;  left   cataract removed Bilateral    left leg amputa     BKA   left leg amputated     SHOULDER SURGERY Right    rotator cuff   Social History   Socioeconomic History   Marital status: Married    Spouse name: Not on file   Number of children: Not on file   Years of education: Not on file   Highest education level: Not on file  Occupational History   Not on file  Tobacco Use   Smoking status: Never    Passive exposure: Never   Smokeless tobacco: Never  Vaping Use   Vaping status: Never Used  Substance and Sexual Activity   Alcohol  use: No    Comment: in the past but not since amputation in 2009, drank  alcohol  on weekends    Drug use: No   Sexual activity: Not Currently  Other Topics Concern   Not on file  Social History Narrative   Not on file   Social Drivers of Health   Financial Resource Strain: Low Risk  (04/02/2021)   Received from [facility]   Overall Financial Resource Strain (CARDIA)    Difficulty of Paying Living Expenses: Not hard at all  Food Insecurity: No Food Insecurity (08/08/2023)   Hunger Vital Sign    Worried About Running Out of Food in the Last Year: Never true    Ran Out of Food in the Last Year: Never true  Transportation Needs: No Transportation Needs (08/08/2023)   PRAPARE - Administrator, Civil Service (Medical): No    Lack of Transportation (Non-Medical): No  Physical Activity: Not on file  Stress: Not on file  Social Connections: Socially Isolated (08/08/2023)   Social Connection and Isolation Panel    Frequency of Communication with Friends and Family: Never    Frequency of Social Gatherings with Friends and Family: Never    Attends Religious Services: Never    Database administrator or Organizations: No    Attends Banker Meetings: Never    Marital Status: Married   Family History  Problem Relation Age of Onset   Heart disease Mother  Diabetes Father    Alcohol  abuse Brother    Diabetes Brother    Diabetes Sister    Peripheral vascular disease Sister        amputation   Diabetes Daughter    Anxiety disorder Neg Hx    Bipolar disorder Neg Hx    Dementia Neg Hx    Depression Neg Hx    Drug abuse Neg Hx    Paranoid behavior Neg Hx    Schizophrenia Neg Hx    OCD Neg Hx    Seizures Neg Hx    Sexual abuse Neg Hx    Physical abuse Neg Hx    ADD / ADHD Neg Hx    Colon cancer Neg Hx    Colon polyps Neg Hx    - negative except otherwise stated in the family history section No Known Allergies Prior to Admission medications   Medication Sig Start Date End Date Taking? Authorizing Provider  acetaminophen   (TYLENOL ) 500 MG tablet Take 500 mg by mouth every 6 (six) hours as needed for mild pain (pain score 1-3).   Yes [provider]  clopidogrel  (PLAVIX ) 75 MG tablet Take 1 tablet (75 mg total) by mouth daily. 07/06/12  Yes Nicholaus Leita LABOR, NP  FLUoxetine  (PROZAC ) 20 MG capsule Take 1 capsule (20 mg total) by mouth every morning. Patient taking differently: Take 20 mg by mouth at bedtime. 08/27/22  Yes Okey Barnie SAUNDERS, MD  gabapentin  (NEURONTIN ) 100 MG capsule Take 100 mg by mouth at bedtime. 07/28/23  Yes [provider]  glipiZIDE (GLUCOTROL) 5 MG tablet Take 5 mg by mouth daily. 10/15/18  Yes [provider]  loperamide (IMODIUM) 2 MG capsule Take 2 mg by mouth as needed for diarrhea or loose stools.   Yes [provider]  Melatonin 5 MG CAPS Take 10 mg by mouth at bedtime as needed (sleep).   Yes [provider]  metoprolol  tartrate (LOPRESSOR ) 25 MG tablet Take 25 mg by mouth daily.   Yes [provider]  pantoprazole  (PROTONIX ) 20 MG tablet Take 1 tablet by mouth daily as needed for heartburn or indigestion.   Yes [provider]  rosuvastatin  (CRESTOR ) 5 MG tablet Take 5 mg by mouth every other day. 12/11/21  Yes [provider]   US  ARTERIAL ABI (SCREENING LOWER EXTREMITY) Result Date: 08/07/2023 CLINICAL DATA:  Right foot infection EXAM: NONINVASIVE PHYSIOLOGIC VASCULAR STUDY OF BILATERAL LOWER EXTREMITIES TECHNIQUE: Evaluation of both lower extremities were performed at rest, including calculation of ankle-brachial indices with single level Doppler, pressure and pulse volume recording. COMPARISON:  05/11/2018, report 04/06/2012, 02/28/2010, 11/06/2009 FINDINGS: Right ABI:  1.08 Left ABI:  Not evaluated due to amputation Right Lower Extremity: Abnormal monophasic appearing waveforms at the ankle and foot Left Lower Extremity:  Not evaluated due to amputation 1.0-1.4 Normal IMPRESSION: 1. ABI value is within the normal range, however  dampened monophasic appearing waveforms at the right ankle and foot, findings could be secondary to false normal ABI secondary to diffuse calcific disease the foot and ankle. If further imaging is required, CT angiography with runoff would be advised Electronically Signed   By: Luke Bun M.D.   On: 08/07/2023 18:51   DG Toe 5th Right Result Date: 08/07/2023 CLINICAL DATA:  Infection.  Toe contractures. EXAM: RIGHT FIFTH TOE COMPARISON:  Radiographs 05/15/2018 and 05/13/2018. FINDINGS: There is new osteolysis and fragmentation of the head of the 5th proximal phalanx, suspicious for osteomyelitis in the appropriate clinical context. The 5th  toe otherwise appears intact with a stable clawtoe deformity. There are stable postsurgical changes from previous amputation of the 4th toe and head of the 4th metatarsal. No other changes are identified. IMPRESSION: New osteolysis and fragmentation of the head of the 5th proximal phalanx, suspicious for osteomyelitis in the appropriate clinical context. Electronically Signed   By: Elsie Perone M.D.   On: 08/07/2023 14:39   - pertinent xrays, CT, MRI studies were reviewed and independently interpreted  Positive ROS: All other systems have been reviewed and were otherwise negative with the exception of those mentioned in the HPI and as above.  Physical Exam: General: Alert, no acute distress Psychiatric: Patient is competent for consent with normal mood and affect Lymphatic: No axillary or cervical lymphadenopathy Cardiovascular: No pedal edema Respiratory: No cyanosis, no use of accessory musculature GI: No organomegaly, abdomen is soft and non-tender    Images:  @ENCIMAGES @  Labs:  Lab Results  Component Value Date   HGBA1C 6.2 (H) 08/07/2023   HGBA1C 7.2 (H) 05/13/2018   HGBA1C (H) 08/14/2007    6.3 (NOTE)   The ADA recommends the following therapeutic goal for glycemic   control related to Hgb A1C measurement:   Goal of Therapy:   < 7.0% Hgb  A1C   Reference: American Diabetes Association: Clinical Practice   Recommendations 2008, Diabetes Care,  2008, 31:(Suppl 1).   REPTSTATUS PENDING 08/07/2023   CULT  08/07/2023    NO GROWTH < 12 HOURS Performed at [facility], [address], [place], [place] [address]     Lab Results  Component Value Date   ALBUMIN 4.1 07/02/2012        Latest Ref Rng & Units 08/08/2023    6:04 AM 08/07/2023    3:35 PM 05/13/2018   10:52 AM  CBC EXTENDED  WBC 4.0 - 10.5 K/uL 7.2  7.6  7.5   RBC 4.22 - 5.81 MIL/uL 4.35  4.15  3.88   Hemoglobin 13.0 - 17.0 g/dL 86.9  87.6  89.1   HCT 39.0 - 52.0 % 38.6  37.4  35.1   Platelets 150 - 400 K/uL 168  183  237   NEUT# 1.7 - 7.7 K/uL   5.0   Lymph# 0.7 - 4.0 K/uL   1.9     Neurologic: Patient does not have protective sensation bilateral lower extremities.   MUSCULOSKELETAL:   Skin: Examination patient's right foot is thin and atrophic.  There is ischemic changes to the right little toe.  Radiograph shows destructive changes of the right little toe.  Recent vascular studies obtained at [facility] showed dampened monophasic flow at the ankle.  Patient has a palpable femoral and popliteal pulse.  No palpable pulses at the ankle.  White cell count 7.2 with a hemoglobin of 13.  Assessment: Assessment: Ischemic ulceration right little toe with osteomyelitis.  Plan: I will consult vascular surgery for vascular workup.  Amputation of the right little toe based on vascular status.  Thank you for the consult and the opportunity to see Mr. Kamarius Buckbee, MD [facility] [phone] 5:50 PM

## 2023-08-08 NOTE — Progress Notes (Signed)
 PROGRESS NOTE   Chad Grant [facility]  FMW:[id number] DOB: Feb 27, 1950 DOA: 08/07/2023 PCP: Maree Isles, MD   Chief Complaint  Patient presents with   Toe Injury   Level of care: Med-Surg  Brief Admission History:  73 y.o. male, with past medical history of peripheral vascular disease status post left BKA 2010 in [place], 3rd and 4th right toe amputation 2014 by Dr. Serene, history of CVA, with residual left-sided weakness, vascular dementia, diabetes mellitus, hyperlipidemia, wife at bedside provide history as patient is pleasantly demented with dysarthria. - Reports progressive right fifth great toe wound over the last few weeks, but has significant discoloration over the last 2 weeks, and does not have the sensation of his feet, denies pain, no fever, no chills per his wife, he usually ambulate slowly with walker and left leg prosthesis - ED he was afebrile, with no leukocytosis, but x-ray of right fifth toe showing significant osteomyelitis, ED discussed with orthopedic on-call, there is likelihood he will need an amputation, so transfer requested to [facility].   Assessment and Plan:  Right fifth toe osteomyelitis - Currently he is afebrile, with no leukocytosis, but toe looks significantly infected, high risk, so he will be admitted for further management - Ceftriaxone vancomycin  in ED,  continue on IV cefazolin  and doxycycline . - Patient to be transferred to [facility] to be evaluated by orthopedic as likely will need amputation, Dr. Louann. Harden to evaluate at [facility], timing for surgery after them. - Obtain ABI, had angiogram with no significant disease, but this was in 2015, 10 years ago, he will need ABI, was ordered as stat - Continue with Tylenol  for pain as appears controlled   History of CVA chronic left-sided weakness -Consult PT, OT -Hold Plavix  today for possible need of surgery -Continue with statin -Consult PT, OT   Diabetes  mellitus -Hold glipizide, will check A1c, and will keep an insulin  sliding scale  CBG (last 3)  Recent Labs    08/07/23 2213  GLUCAP 173*   Hypertension - Continue with home medication include metoprolol    GERD -Continue with PPI   Vascular dementia - Continue with supportive care,  DVT prophylaxis: sq heparin  Code Status: DNR  Communication:  Disposition: awaiting transfer to [facility]    Consultants:  Orthopedics Dr. Janetta Procedures:  TBD Antimicrobials:  Cefazolin  doxycycline  Subjective: Pt with not specific complaints, remains agreeable to transfer to [facility]  Objective: Vitals:   08/08/23 0500 08/08/23 0530 08/08/23 0610 08/08/23 0700  BP: (!) 151/71  (!) 167/87   Pulse:  (!) 50 (!) 55   Resp: (!) 21 19 14    Temp:   97.9 F (36.6 C)   TempSrc:   Oral   SpO2:  100% 100% 100%  Weight:      Height:        Intake/Output Summary (Last 24 hours) at 08/08/2023 0735 Last data filed at 08/07/2023 1715 Gross per 24 hour  Intake 203.12 ml  Output --  Net 203.12 ml   Filed Weights   08/07/23 1337  Weight: 68.6 kg   Examination:  General exam: Appears calm and comfortable  Respiratory system: Clear to auscultation. Respiratory effort normal. Cardiovascular system: normal S1 & S2 heard. No JVD, murmurs, rubs, gallops or clicks. No pedal edema. Gastrointestinal system: Abdomen is nondistended, soft and nontender. No organomegaly or masses felt. Normal bowel sounds heard. Central nervous system: Alert and oriented. No focal neurological deficits. Extremities: Symmetric 5 x 5 power. Skin: No  rashes, lesions or ulcers. Psychiatry: Judgement and insight appear normal. Mood & affect appropriate.   Data Reviewed: I have personally reviewed following labs and imaging studies  CBC: Recent Labs  Lab 08/07/23 1535 08/08/23 0604  WBC 7.6 7.2  HGB 12.3* 13.0  HCT 37.4* 38.6*  MCV 90.1 88.7  PLT 183 168    Basic Metabolic Panel: Recent Labs  Lab 08/07/23 1535  08/08/23 0604  NA 140 137  K 4.0 3.9  CL 102 101  CO2 28 27  GLUCOSE 151* 137*  BUN 18 15  CREATININE 0.77 0.71  CALCIUM  9.0 8.8*    CBG: Recent Labs  Lab 08/07/23 2213  GLUCAP 173*    Recent Results (from the past 240 hours)  Blood culture (routine x 2)     Status: None (Preliminary result)   Collection Time: 08/07/23  3:35 PM   Specimen: BLOOD LEFT FOREARM  Result Value Ref Range Status   Specimen Description BLOOD LEFT FOREARM  Final   Special Requests   Final    BOTTLES DRAWN AEROBIC AND ANAEROBIC Blood Culture adequate volume   Culture   Final    NO GROWTH < 12 HOURS Performed at [facility], [address], [place], [place] [address]    Report Status PENDING  Incomplete  Blood culture (routine x 2)     Status: None (Preliminary result)   Collection Time: 08/07/23  3:40 PM   Specimen: BLOOD LEFT ARM  Result Value Ref Range Status   Specimen Description BLOOD LEFT ARM  Final   Special Requests   Final    BOTTLES DRAWN AEROBIC AND ANAEROBIC Blood Culture results may not be optimal due to an inadequate volume of blood received in culture bottles   Culture   Final    NO GROWTH < 12 HOURS Performed at [facility], [address], [place], [place] [address]    Report Status PENDING  Incomplete     Radiology Studies: US  ARTERIAL ABI (SCREENING LOWER EXTREMITY) Result Date: 08/07/2023 CLINICAL DATA:  Right foot infection EXAM: NONINVASIVE PHYSIOLOGIC VASCULAR STUDY OF BILATERAL LOWER EXTREMITIES TECHNIQUE: Evaluation of both lower extremities were performed at rest, including calculation of ankle-brachial indices with single level Doppler, pressure and pulse volume recording. COMPARISON:  05/11/2018, report 04/06/2012, 02/28/2010, 11/06/2009 FINDINGS: Right ABI:  1.08 Left ABI:  Not evaluated due to amputation Right Lower Extremity: Abnormal monophasic appearing waveforms at the ankle and foot Left Lower Extremity:  Not evaluated due to amputation 1.0-1.4 Normal  IMPRESSION: 1. ABI value is within the normal range, however dampened monophasic appearing waveforms at the right ankle and foot, findings could be secondary to false normal ABI secondary to diffuse calcific disease the foot and ankle. If further imaging is required, CT angiography with runoff would be advised Electronically Signed   By: Luke Bun M.D.   On: 08/07/2023 18:51   DG Toe 5th Right Result Date: 08/07/2023 CLINICAL DATA:  Infection.  Toe contractures. EXAM: RIGHT FIFTH TOE COMPARISON:  Radiographs 05/15/2018 and 05/13/2018. FINDINGS: There is new osteolysis and fragmentation of the head of the 5th proximal phalanx, suspicious for osteomyelitis in the appropriate clinical context. The 5th toe otherwise appears intact with a stable clawtoe deformity. There are stable postsurgical changes from previous amputation of the 4th toe and head of the 4th metatarsal. No other changes are identified. IMPRESSION: New osteolysis and fragmentation of the head of the 5th proximal phalanx, suspicious for osteomyelitis in the appropriate clinical context. Electronically Signed  By: Elsie Perone M.D.   On: 08/07/2023 14:39    Scheduled Meds:  doxycycline   100 mg Oral Q12H   FLUoxetine   20 mg Oral QHS   gabapentin   100 mg Oral QHS   heparin   5,000 Units Subcutaneous Q8H   insulin  aspart  0-9 Units Subcutaneous TID WC   metoprolol  tartrate  25 mg Oral Daily   rosuvastatin   5 mg Oral QODAY   Continuous Infusions:   ceFAZolin  (ANCEF ) IV Stopped (08/08/23 9366)     LOS: 1 day   Time spent: 55 mins  Lavance Beazer Vicci, MD How to contact the [facility] Attending or Consulting provider 7A - 7P or covering provider during after hours 7P -7A, for this patient?  Check the care team in [facility] and look for a) attending/consulting TRH provider listed and b) the TRH team listed Log into [email or web address] to find provider on call.  Locate the TRH provider you are looking for under [facility] and page to a number  that you can be directly reached. If you still have difficulty reaching the provider, please page the [facility] (Director on Call) for the Hospitalists listed on amion for assistance.  08/08/2023, 7:35 AM

## 2023-08-08 NOTE — Progress Notes (Signed)
 Transferred from [facility]. Presents with Right fifth toe osteomyelitis Hx of PVD, s/p left BKA , s/p 3rd and 4th right toe amputation, CVA, with residual left-sided weakness, vascular dementia, diabetes mellitus, hyperlipidemia,   demented, dysarthria.    08/08/23 1407  TOC Brief Assessment  Insurance and Status Reviewed  Patient has primary care physician Yes  Home environment has been reviewed From home with wife.  Prior level of function: PTA independent with ADL's, DME: walker and left leg prosthesis, W/C  Prior/Current Home Services No current home services  Social Drivers of Health Review SDOH reviewed no interventions necessary  Readmission risk has been reviewed No  Transition of care needs transition of care needs identified, TOC will continue to follow   Ortho consult pending .[facility] TOC team following and will assist with TOC needs, Jon Hoit RN,BSN,CM

## 2023-08-08 NOTE — Hospital Course (Signed)
 73 y.o. male, with past medical history of peripheral vascular disease status post left BKA 2010 in [place], 3rd and 4th right toe amputation 2014 by Dr. Serene, history of CVA, with residual left-sided weakness, vascular dementia, diabetes mellitus, hyperlipidemia, wife at bedside provide history as patient is pleasantly demented with dysarthria. - Reports progressive right fifth great toe wound over the last few weeks, but has significant discoloration over the last 2 weeks, and does not have the sensation of his feet, denies pain, no fever, no chills per his wife, he usually ambulate slowly with walker and left leg prosthesis - ED he was afebrile, with no leukocytosis, but x-ray of right fifth toe showing significant osteomyelitis, ED discussed with orthopedic on-call, there is likelihood he will need an amputation, so transfer requested to [facility].

## 2023-08-09 DIAGNOSIS — I70235 Atherosclerosis of native arteries of right leg with ulceration of other part of foot: Secondary | ICD-10-CM

## 2023-08-09 DIAGNOSIS — L97519 Non-pressure chronic ulcer of other part of right foot with unspecified severity: Secondary | ICD-10-CM

## 2023-08-09 DIAGNOSIS — M869 Osteomyelitis, unspecified: Secondary | ICD-10-CM | POA: Diagnosis not present

## 2023-08-09 LAB — GLUCOSE, CAPILLARY
Glucose-Capillary: 148 mg/dL — ABNORMAL HIGH (ref 70–99)
Glucose-Capillary: 147 mg/dL — ABNORMAL HIGH (ref 70–99)
Glucose-Capillary: 94 mg/dL (ref 70–99)
Glucose-Capillary: 149 mg/dL — ABNORMAL HIGH (ref 70–99)

## 2023-08-09 NOTE — Consult Note (Signed)
 Hospital Consult    Reason for Consult: Right lower extremity critical limb ischemia with tissue loss at the fifth toe Requesting Physician: Jerona Sage, MD MRN #:  [id number]  History of Present Illness: This is a 73 y.o. male well-known to the vascular surgery service having previously undergone right-sided stenting in 2014 for right lower extremity ulceration.  Patient also has history of left-sided below-knee amputation due to unreconstructable vascular disease.  On exam, Elton was resting comfortably.  He had no complaints.  He stated the wound on the right fifth toe been present for a number of months. Denies fevers, denies chills, denies drainage.  Denied erythema, induration.  Past Medical History:  Diagnosis Date   Dementia (HCC)    Depression    Diabetes mellitus type II    Hypercholesteremia    Hypertension    S/P BKA (below knee amputation) unilateral (HCC)    Stroke (HCC) 2013   left sided weakness   TIA (transient ischemic attack)     Past Surgical History:  Procedure Laterality Date   ABDOMINAL AORTAGRAM N/A 04/29/2012   Procedure: ABDOMINAL EZELLA;  Surgeon: Gaile LELON New, MD;  Location: [facility] CATH LAB;  Service: Cardiovascular;  Laterality: N/A;   AMPUTATION Right 05/15/2018   Procedure: AMPUTATION RIGHT THIRD TOE;  Surgeon: Blinda Katz, DPM;  Location: [facility] ORS;  Service: Podiatry;  Laterality: Right;   CATARACT EXTRACTION W/PHACO Left 12/04/2015   Procedure: CATARACT EXTRACTION PHACO AND INTRAOCULAR LENS PLACEMENT LEFT EYE CDE=19.92;  Surgeon: Cherene Mania, MD;  Location: [facility] ORS;  Service: Ophthalmology;  Laterality: Left;  left   cataract removed Bilateral    left leg amputa     BKA   left leg amputated     SHOULDER SURGERY Right    rotator cuff    No Known Allergies  Prior to Admission medications   Medication Sig Start Date End Date Taking? Authorizing Provider  acetaminophen  (TYLENOL ) 500 MG tablet Take 500 mg by mouth every 6 (six) hours as  needed for mild pain (pain score 1-3).   Yes [provider]  clopidogrel  (PLAVIX ) 75 MG tablet Take 1 tablet (75 mg total) by mouth daily. 07/06/12  Yes Nicholaus Leita LABOR, NP  FLUoxetine  (PROZAC ) 20 MG capsule Take 1 capsule (20 mg total) by mouth every morning. Patient taking differently: Take 20 mg by mouth at bedtime. 08/27/22  Yes Okey Barnie SAUNDERS, MD  gabapentin  (NEURONTIN ) 100 MG capsule Take 100 mg by mouth at bedtime. 07/28/23  Yes [provider]  glipiZIDE (GLUCOTROL) 5 MG tablet Take 5 mg by mouth daily. 10/15/18  Yes [provider]  loperamide (IMODIUM) 2 MG capsule Take 2 mg by mouth as needed for diarrhea or loose stools.   Yes [provider]  Melatonin 5 MG CAPS Take 10 mg by mouth at bedtime as needed (sleep).   Yes [provider]  metoprolol  tartrate (LOPRESSOR ) 25 MG tablet Take 25 mg by mouth daily.   Yes [provider]  pantoprazole  (PROTONIX ) 20 MG tablet Take 1 tablet by mouth daily as needed for heartburn or indigestion.   Yes [provider]  rosuvastatin  (CRESTOR ) 5 MG tablet Take 5 mg by mouth every other day. 12/11/21  Yes [provider]    Social History   Socioeconomic History   Marital status: Married    Spouse name: Not on file   Number of children: Not on file   Years of education: Not on file   Highest education level:  Not on file  Occupational History   Not on file  Tobacco Use   Smoking status: Never    Passive exposure: Never   Smokeless tobacco: Never  Vaping Use   Vaping status: Never Used  Substance and Sexual Activity   Alcohol  use: No    Comment: in the past but not since amputation in 2009, drank alcohol  on weekends    Drug use: No   Sexual activity: Not Currently  Other Topics Concern   Not on file  Social History Narrative   Not on file   Social Drivers of Health   Financial Resource Strain: Low Risk  (04/02/2021)   Received from [facility]   Overall Financial  Resource Strain (CARDIA)    Difficulty of Paying Living Expenses: Not hard at all  Food Insecurity: No Food Insecurity (08/08/2023)   Hunger Vital Sign    Worried About Running Out of Food in the Last Year: Never true    Ran Out of Food in the Last Year: Never true  Transportation Needs: No Transportation Needs (08/08/2023)   PRAPARE - Administrator, Civil Service (Medical): No    Lack of Transportation (Non-Medical): No  Physical Activity: Not on file  Stress: Not on file  Social Connections: Socially Isolated (08/08/2023)   Social Connection and Isolation Panel    Frequency of Communication with Friends and Family: Never    Frequency of Social Gatherings with Friends and Family: Never    Attends Religious Services: Never    Database administrator or Organizations: No    Attends Banker Meetings: Never    Marital Status: Married  Catering manager Violence: Not At Risk (08/08/2023)   Humiliation, Afraid, Rape, and Kick questionnaire    Fear of Current or Ex-Partner: No    Emotionally Abused: No    Physically Abused: No    Sexually Abused: No    Family History  Problem Relation Age of Onset   Heart disease Mother    Diabetes Father    Alcohol  abuse Brother    Diabetes Brother    Diabetes Sister    Peripheral vascular disease Sister        amputation   Diabetes Daughter    Anxiety disorder Neg Hx    Bipolar disorder Neg Hx    Dementia Neg Hx    Depression Neg Hx    Drug abuse Neg Hx    Paranoid behavior Neg Hx    Schizophrenia Neg Hx    OCD Neg Hx    Seizures Neg Hx    Sexual abuse Neg Hx    Physical abuse Neg Hx    ADD / ADHD Neg Hx    Colon cancer Neg Hx    Colon polyps Neg Hx     ROS: Otherwise negative unless mentioned in HPI  Physical Examination  Vitals:   08/09/23 0426 08/09/23 0733  BP: 110/67 123/62  Pulse: 60 (!) 57  Resp: 18 19  Temp: 98 F (36.7 C) 98.1 F (36.7 C)  SpO2: 100% 100%   Body mass index is 22.01  kg/m.  General:  WDWN in NAD Gait: Not observed HENT: WNL, normocephalic Pulmonary: normal non-labored breathing, without Rales, rhonchi,  wheezing Cardiac: regular Abdomen:  soft,  Skin: without rashes Vascular Exam/Pulses: Palpable femoral pulses, nonpalpable pedal pulses Extremities: with ischemic changes, with Gangrene , without cellulitis; with open wounds;  Musculoskeletal: no muscle wasting or atrophy  Neurologic: A&O X 3;  No  focal weakness or paresthesias are detected; speech is fluent/normal Psychiatric:  The pt has Normal affect. Lymph:  Unremarkable  CBC    Component Value Date/Time   WBC 7.2 08/08/2023 0604   RBC 4.35 08/08/2023 0604   HGB 13.0 08/08/2023 0604   HCT 38.6 (L) 08/08/2023 0604   PLT 168 08/08/2023 0604   MCV 88.7 08/08/2023 0604   MCH 29.9 08/08/2023 0604   MCHC 33.7 08/08/2023 0604   RDW 12.6 08/08/2023 0604   LYMPHSABS 1.9 05/13/2018 1052   MONOABS 0.5 05/13/2018 1052   EOSABS 0.1 05/13/2018 1052   BASOSABS 0.0 05/13/2018 1052    BMET    Component Value Date/Time   NA 137 08/08/2023 0604   K 3.9 08/08/2023 0604   CL 101 08/08/2023 0604   CO2 27 08/08/2023 0604   GLUCOSE 137 (H) 08/08/2023 0604   BUN 15 08/08/2023 0604   CREATININE 0.71 08/08/2023 0604   CALCIUM  8.8 (L) 08/08/2023 0604   GFRNONAA >60 08/08/2023 0604   GFRAA >60 05/13/2018 1052    COAGS: No results found for: INR, PROTIME    ASSESSMENT/PLAN: This is a 73 y.o. male with right lower extremity critical ischemia and tissue loss of the fifth digit.  This is necrotic, will require amputation.  I do not think he has necessary perfusion for wound healing at this time.  He has monophasic waveforms, depressed toe pressure, nonpalpable pulse in the foot.  I had a long discussion with him regarding the risks and benefits of right lower extremity angiogram in an effort to define and improve his perfusion for wound healing.  After discussing risk and benefits, Yidel elected to  proceed.  Will plan for Monday.  Please make n.p.o. Sunday night.    Fonda FORBES Rim MD MS [facility] [phone] 08/09/2023  7:49 AM

## 2023-08-09 NOTE — Plan of Care (Signed)
  Problem: Education: Goal: Ability to describe self-care measures that may prevent or decrease complications (Diabetes Survival Skills Education) will improve Outcome: Progressing   Problem: Coping: Goal: Ability to adjust to condition or change in health will improve Outcome: Progressing   Problem: Fluid Volume: Goal: Ability to maintain a balanced intake and output will improve Outcome: Progressing   Problem: Health Behavior/Discharge Planning: Goal: Ability to identify and utilize available resources and services will improve Outcome: Progressing Goal: Ability to manage health-related needs will improve Outcome: Progressing   Problem: Nutritional: Goal: Maintenance of adequate nutrition will improve Outcome: Progressing Goal: Progress toward achieving an optimal weight will improve Outcome: Progressing

## 2023-08-09 NOTE — Evaluation (Signed)
 Physical Therapy Evaluation Patient Details Name: Chad Grant MRN: [id number] DOB: 12-25-1950 Today's Date: 08/09/2023  History of Present Illness  Chad Grant is a 73 y.o. male admitted 08/07/23 for osteomyelitis of the right fifth toe. Pending vascular work-up and possible amputation based on vascular status. PMHx: PVD, s/p L BKA 2010, s/p R 3rd and 4th toe amputation 2014, CVA with residual left-sided weakness, vascular dementia, and DM, HLD.   Clinical Impression  Pt admitted with above diagnosis. PTA, pt required assistance with transfers, some ADLs, and all IADLs. Pt was modI for short distance ambulation using RW and L BKA prosthesis. He lives with his wife in a one story house with a ramp entrance. Pt currently with functional limitations due to the deficits listed below (see PT Problem List). He required close supervision for safety during bed mobility, CGA x2 for sit<>stand using RW, and minA x2 for bed>chair using RW. Pt did not have his prosthesis in the room and was unable to take a true steps without it. He advanced his RLE across the floor by pivoting on the forefoot and heel. Pt maintained static stance for ~4 mins with BUE support. As he fatigued pt increased his forward lean. Pt will benefit from acute skilled PT to increase his independence and safety with mobility to allow discharge. Recommend HHPT to increase endurance, improve balance, decrease fall risk, and optimize safety within the home environment.      If plan is discharge home, recommend the following: A little help with walking and/or transfers;A little help with bathing/dressing/bathroom;Assistance with cooking/housework;Assist for transportation;Help with stairs or ramp for entrance;Supervision due to cognitive status;Direct supervision/assist for medications management;Direct supervision/assist for financial management   Can travel by private vehicle        Equipment Recommendations None recommended by PT (Pt  already has DME)  Recommendations for Other Services       Functional Status Assessment Patient has had a recent decline in their functional status and demonstrates the ability to make significant improvements in function in a reasonable and predictable amount of time.     Precautions / Restrictions Precautions Precautions: Fall Recall of Precautions/Restrictions: Intact Restrictions Weight Bearing Restrictions Per Provider Order: No      Mobility  Bed Mobility Overal bed mobility: Needs Assistance Bed Mobility: Supine to Sit     Supine to sit: HOB elevated, Used rails, Supervision     General bed mobility comments: Pt sat up on L side of bed with increased time. No cues for sequencing or physical assist. Supervision for safety.    Transfers Overall transfer level: Needs assistance Equipment used: Rolling walker (2 wheels) Transfers: Sit to/from Stand, Bed to chair/wheelchair/BSC Sit to Stand: Contact guard assist, +2 physical assistance Stand pivot transfers: Min assist, +2 safety/equipment         General transfer comment: Pt stood from lowest bed height. He demonstrated proper hand placement using RW. Powered up with CGA x2. Maintained static stance for a prolonged time while pericare was addressed by OT. Transferred to recliner chair on left by sliding Rt foot across the ground. Pt's L BKA prosthetic was not present in the room. Assist to manuever RW while turning. As pt fatigued he increased fwd lean. Good eccentric control with sitting.    Ambulation/Gait               General Gait Details: Deferred d/t fatigue from transfer  and lack of L BKA prosthetic present.  Stairs  Wheelchair Mobility     Tilt Bed    Modified Rankin (Stroke Patients Only)       Balance Overall balance assessment: Needs assistance Sitting-balance support: No upper extremity supported, Feet supported Sitting balance-Leahy Scale: Good Sitting balance -  Comments: Pt sat EOB and engaged in MMT assessment as well as donned sock on Rt foot without LOB.   Standing balance support: Bilateral upper extremity supported, During functional activity, Reliant on assistive device for balance Standing balance-Leahy Scale: Poor Standing balance comment: Pt dependent on RW                             Pertinent Vitals/Pain Pain Assessment Pain Assessment: No/denies pain    Home Living Family/patient expects to be discharged to:: Private residence Living Arrangements: Spouse/significant other Available Help at Discharge: Family;Available PRN/intermittently Type of Home: House Home Access: Stairs to enter;Ramped entrance       Home Layout: One level Home Equipment: Shower seat;Grab bars - tub/shower;Rolling Walker (2 wheels);Hand held shower head;Rollator (4 wheels);Wheelchair - manual;Other (comment) (upright walker) Additional Comments: Wife has cameras in the house so she can check in on him whenever she is out and about.    Prior Function Prior Level of Function : Needs assist       Physical Assist : Mobility (physical);ADLs (physical) Mobility (physical): Transfers ADLs (physical): Bathing;Dressing;IADLs Mobility Comments: ModI using RW and LLE prosthesis. Wife reports he is very slow and he hates to get up and walk d/t back pain. Pt enters the house using the ramp. Wife assist 50% with transfers including standing and getting in/out of tub. Denies fall history. ADLs Comments: Pt wears depens?. Not getting on/off toilet. Wife assists 50% with bathing. Pt is able to manage UB dressing needs intermittent assist with LB dressing. He is able to don is prosthetic. Pt can feed himself, wash his face, brush his teeth. Wife manages IADLs. Pt loves music!     Extremity/Trunk Assessment   Upper Extremity Assessment Upper Extremity Assessment: Defer to OT evaluation    Lower Extremity Assessment Lower Extremity Assessment: Overall WFL  for tasks assessed    Cervical / Trunk Assessment Cervical / Trunk Assessment: Normal  Communication   Communication Communication: No apparent difficulties    Cognition Arousal: Alert Behavior During Therapy: WFL for tasks assessed/performed   PT - Cognitive impairments: Orientation, History of cognitive impairments (Vascular Dementia)   Orientation impairments: Time (Pt reported it was December and 2002. Provided cues about being in the summer and recent holiday, able to state July.)                     Following commands: Intact Following commands impaired: Follows one step commands with increased time, Follows multi-step commands with increased time     Cueing Cueing Techniques: Verbal cues, Gestural cues, Visual cues     General Comments General comments (skin integrity, edema, etc.): Right fifth great toe with discoloration and gangrene. Wife present and supporitive throughout session.    Exercises     Assessment/Plan    PT Assessment Patient needs continued PT services  PT Problem List Decreased activity tolerance;Decreased balance;Decreased mobility;Decreased skin integrity       PT Treatment Interventions DME instruction;Gait training;Functional mobility training;Therapeutic activities;Therapeutic exercise;Balance training;Patient/family education    PT Goals (Current goals can be found in the Care Plan section)  Acute Rehab PT Goals Patient Stated Goal: Return Home PT Goal Formulation: With  patient/family Time For Goal Achievement: 08/23/23 Potential to Achieve Goals: Good    Frequency Min 2X/week     Co-evaluation PT/OT/SLP Co-Evaluation/Treatment: Yes Reason for Co-Treatment: For patient/therapist safety;To address functional/ADL transfers PT goals addressed during session: Mobility/safety with mobility;Balance         AM-PAC PT 6 Clicks Mobility  Outcome Measure Help needed turning from your back to your side while in a flat bed  without using bedrails?: A Little Help needed moving from lying on your back to sitting on the side of a flat bed without using bedrails?: A Little Help needed moving to and from a bed to a chair (including a wheelchair)?: A Lot Help needed standing up from a chair using your arms (e.g., wheelchair or bedside chair)?: A Lot Help needed to walk in hospital room?: A Lot Help needed climbing 3-5 steps with a railing? : A Lot 6 Click Score: 14    End of Session Equipment Utilized During Treatment: Gait belt Activity Tolerance: Patient tolerated treatment well;Patient limited by fatigue Patient left: in chair;with call bell/phone within reach;with chair alarm set;with family/visitor present Nurse Communication: Mobility status PT Visit Diagnosis: Difficulty in walking, not elsewhere classified (R26.2);Other abnormalities of gait and mobility (R26.89)    Time: 1201-1226 PT Time Calculation (min) (ACUTE ONLY): 25 min   Charges:   PT Evaluation $PT Eval Moderate Complexity: 1 Mod   PT General Charges $$ ACUTE PT VISIT: 1 Visit         Randall SAUNDERS, PT, DPT Acute Rehabilitation Services Office: [phone] Secure Chat Preferred  Delon CHRISTELLA Callander 08/09/2023, 1:28 PM

## 2023-08-09 NOTE — Progress Notes (Signed)
 PROGRESS NOTE   Chad Grant  FMW:[id number] DOB: 1950/02/24 DOA: 08/07/2023 PCP: Maree Isles, MD   Chief Complaint  Patient presents with   Toe Injury   Level of care: Med-Surg  Brief Admission History:  Patient is a 73 year old male past medical history significant for peripheral vascular disease status post left BKA in 2010, status post 3rd and 4th toe amputation, history of CVA with residual left-sided weakness, vascular dementia, diabetes mellitus and hyperlipidemia.  Patient was admitted with right fifth toe gangrene and query osteomyelitis, with associated PVD.  Vascular surgery team has seen patient.  Plan is to proceed with vascular workup in 2 days time, 08/11/2023.  Orthopedic surgery input is appreciated.  Orthopedic surgery will help twice after vascular surgery workup.  08/09/2023: Patient seen alongside patient's wife.  No complaints.   Assessment and Plan:  Right fifth toe gangrene: - Query osteomyelitis -Vascular surgery and orthopedic surgery input is appreciated. - Follow vascular surgery workup on Monday, 08/11/2023.  Orthopedic team will advise afterwards. - Patient is currently afebrile.  No significant history from patient.   - Ceftriaxone vancomycin  in ED,  continued on IV cefazolin  and doxycycline . - Patient was transferred to [facility] to be evaluated by orthopedic as likely will need amputation, as directed by Dr. Celena.orthopedic surgery team, Dr. Harden, to evaluate at [facility], timing for surgery after them. -Vascular surgery has been consulted.  For further workup as documented above. -Optimize pain control.   History of CVA chronic left-sided weakness -Consult PT, OT -Hold Plavix  today for possible need of surgery -Continue with statin -Consult PT, OT   Diabetes mellitus -Hold glipizide, will check A1c, and will keep an insulin  sliding scale  CBG (last 3)  Recent Labs    08/08/23 2129 08/09/23 0605 08/09/23 1135  GLUCAP  145* 148* 147*   Hypertension - Continue with home medication include metoprolol  -Blood pressure is controlled.   GERD -Continue with PPI   Vascular dementia - Continue with supportive care,  DVT prophylaxis: sq heparin  Code Status: DNR  Communication:  Disposition: awaiting transfer to [facility]    Consultants:  Orthopedics Dr. Janetta Vascular surgery  Procedures:  TBD  Antimicrobials:  Cefazolin  Doxycycline   Subjective: No new complaints. Seen alongside patient's wife.    Objective: Vitals:   08/08/23 1948 08/09/23 0000 08/09/23 0426 08/09/23 0733  BP: (!) 99/54 102/72 110/67 123/62  Pulse: (!) 58 (!) 55 60 (!) 57  Resp: 17 18 18 19   Temp: 98 F (36.7 C) 97.7 F (36.5 C) 98 F (36.7 C) 98.1 F (36.7 C)  TempSrc: Oral Oral Oral Oral  SpO2: 100% 100% 100% 100%  Weight:      Height:        Intake/Output Summary (Last 24 hours) at 08/09/2023 1209 Last data filed at 08/09/2023 0400 Gross per 24 hour  Intake 700 ml  Output 250 ml  Net 450 ml   Filed Weights   08/07/23 1337  Weight: 68.6 kg   Examination:  General exam: Appears calm and comfortable. Respiratory system: Clear to auscultation.  Cardiovascular system: normal S1 & S2 heard. Gastrointestinal system: Abdomen is soft and nontender.   Central nervous system: Awake and alert.   Extremities: Gangrene involving right fifth toe.    Data Reviewed: I have personally reviewed following labs and imaging studies  CBC: Recent Labs  Lab 08/07/23 1535 08/08/23 0604  WBC 7.6 7.2  HGB 12.3* 13.0  HCT 37.4* 38.6*  MCV 90.1 88.7  PLT 183 168    Basic Metabolic Panel: Recent Labs  Lab 08/07/23 1535 08/08/23 0604  NA 140 137  K 4.0 3.9  CL 102 101  CO2 28 27  GLUCOSE 151* 137*  BUN 18 15  CREATININE 0.77 0.71  CALCIUM  9.0 8.8*    CBG: Recent Labs  Lab 08/08/23 1027 08/08/23 1617 08/08/23 2129 08/09/23 0605 08/09/23 1135  GLUCAP 97 161* 145* 148* 147*    Recent Results (from the  past 240 hours)  Blood culture (routine x 2)     Status: None (Preliminary result)   Collection Time: 08/07/23  3:35 PM   Specimen: BLOOD LEFT FOREARM  Result Value Ref Range Status   Specimen Description BLOOD LEFT FOREARM  Final   Special Requests   Final    BOTTLES DRAWN AEROBIC AND ANAEROBIC Blood Culture adequate volume   Culture   Final    NO GROWTH 2 DAYS Performed at [facility], [address], [place], [place] [address]    Report Status PENDING  Incomplete  Blood culture (routine x 2)     Status: None (Preliminary result)   Collection Time: 08/07/23  3:40 PM   Specimen: BLOOD LEFT ARM  Result Value Ref Range Status   Specimen Description BLOOD LEFT ARM  Final   Special Requests   Final    BOTTLES DRAWN AEROBIC AND ANAEROBIC Blood Culture results may not be optimal due to an inadequate volume of blood received in culture bottles   Culture   Final    NO GROWTH 2 DAYS Performed at [facility], [address], [place], [place] [address]    Report Status PENDING  Incomplete     Radiology Studies: US  ARTERIAL ABI (SCREENING LOWER EXTREMITY) Result Date: 08/07/2023 CLINICAL DATA:  Right foot infection EXAM: NONINVASIVE PHYSIOLOGIC VASCULAR STUDY OF BILATERAL LOWER EXTREMITIES TECHNIQUE: Evaluation of both lower extremities were performed at rest, including calculation of ankle-brachial indices with single level Doppler, pressure and pulse volume recording. COMPARISON:  05/11/2018, report 04/06/2012, 02/28/2010, 11/06/2009 FINDINGS: Right ABI:  1.08 Left ABI:  Not evaluated due to amputation Right Lower Extremity: Abnormal monophasic appearing waveforms at the ankle and foot Left Lower Extremity:  Not evaluated due to amputation 1.0-1.4 Normal IMPRESSION: 1. ABI value is within the normal range, however dampened monophasic appearing waveforms at the right ankle and foot, findings could be secondary to false normal ABI secondary to diffuse calcific disease the foot and ankle. If further  imaging is required, CT angiography with runoff would be advised Electronically Signed   By: Luke Bun M.D.   On: 08/07/2023 18:51   DG Toe 5th Right Result Date: 08/07/2023 CLINICAL DATA:  Infection.  Toe contractures. EXAM: RIGHT FIFTH TOE COMPARISON:  Radiographs 05/15/2018 and 05/13/2018. FINDINGS: There is new osteolysis and fragmentation of the head of the 5th proximal phalanx, suspicious for osteomyelitis in the appropriate clinical context. The 5th toe otherwise appears intact with a stable clawtoe deformity. There are stable postsurgical changes from previous amputation of the 4th toe and head of the 4th metatarsal. No other changes are identified. IMPRESSION: New osteolysis and fragmentation of the head of the 5th proximal phalanx, suspicious for osteomyelitis in the appropriate clinical context. Electronically Signed   By: Elsie Perone M.D.   On: 08/07/2023 14:39    Scheduled Meds:  doxycycline   100 mg Oral Q12H   FLUoxetine   20 mg Oral QHS   gabapentin   100 mg Oral QHS   heparin   5,000 Units  Subcutaneous Q8H   insulin  aspart  0-9 Units Subcutaneous TID WC   metoprolol  tartrate  25 mg Oral Daily   rosuvastatin   5 mg Oral QODAY   Continuous Infusions:   ceFAZolin  (ANCEF ) IV 2 g (08/09/23 0511)     LOS: 2 days   Time spent: 35 mins  Leatrice LILLETTE Chapel, MD How to contact the [facility] Attending or Consulting provider 7A - 7P or covering provider during after hours 7P -7A, for this patient?  Check the care team in [facility] and look for a) attending/consulting [facility] provider listed and b) the [facility] team listed Log into [email or web address] to find provider on call.  Locate the [facility] provider you are looking for under [facility] and page to a number that you can be directly reached. If you still have difficulty reaching the provider, please page the [facility] (Director on Call) for the Hospitalists listed on amion for assistance.  08/09/2023, 12:09 PM

## 2023-08-09 NOTE — Evaluation (Signed)
 Occupational Therapy Evaluation Patient Details Name: Chad Grant MRN: [id number] DOB: Feb 03, 1950 Today's Date: 08/09/2023   History of Present Illness   Chad Grant is a 73 y.o. male admitted 08/07/23 for osteomyelitis of the right fifth toe. Pending vascular work-up and possible amputation based on vascular status. PMHx: PVD, s/p L BKA 2010, s/p R 3rd and 4th toe amputation 2014, CVA with residual left-sided weakness, vascular dementia, and DM, HLD.     Clinical Impressions At baseline, pt requires Set up for self-feeding and grooming tasks, up to Mod assist with bathing and dressing, and Max to Total assist with toileting with use of adult briefs. At baseline, pt performs functional mobility Mod I with use of RW and with L LE prosthesis donned. Pt now presents with decreased activity tolerance, decreased balance, impaired L UE coordination, generalized B UE weakness, impaired cognition at baseline, and decreased safety and independence with functional tasks. Pt currently completing ADLs with largely Set up to Total assist +2 and stand-pivot transfers with Min assist +2 with use of a RW. Pt participated well in session and will benefit from acute skilled OT services to address deficits below and increase safety and independence with functional tasks. Post acute discharge, pt will benefit from [facility] OT to maximize rehab potential.      If plan is discharge home, recommend the following:   Two people to help with walking and/or transfers;A lot of help with bathing/dressing/bathroom;Assistance with cooking/housework;Direct supervision/assist for medications management;Direct supervision/assist for financial management;Assist for transportation;Help with stairs or ramp for entrance     Functional Status Assessment   Patient has had a recent decline in their functional status and demonstrates the ability to make significant improvements in function in a reasonable and predictable amount of  time.     Equipment Recommendations   None recommended by OT (Pt already has needed equipment)     Recommendations for Other Services         Precautions/Restrictions   Precautions Precautions: Fall Recall of Precautions/Restrictions: Impaired Restrictions Weight Bearing Restrictions Per Provider Order: No     Mobility Bed Mobility Overal bed mobility: Needs Assistance Bed Mobility: Supine to Sit     Supine to sit: Supervision, HOB elevated, Used rails     General bed mobility comments: Pt sat up on L side of bed with increased time. No cues for sequencing or physical assist. Supervision for safety.    Transfers Overall transfer level: Needs assistance Equipment used: Rolling walker (2 wheels) Transfers: Sit to/from Stand, Bed to chair/wheelchair/BSC Sit to Stand: Contact guard assist, +2 physical assistance Stand pivot transfers: Min assist, +2 safety/equipment         General transfer comment: Pt stood from lowest bed height. He demonstrated proper hand placement using RW. Powered up with CGA x2. Maintained static stance for a prolonged time while pericare was addressed by OT. Transferred to recliner chair on left by sliding Rt foot across the ground. Pt's L BKA prosthetic was not present in the room. Assist to manuever RW while turning. As pt fatigued he increased fwd lean. Good eccentric control with sitting.      Balance Overall balance assessment: Needs assistance Sitting-balance support: No upper extremity supported, Feet supported Sitting balance-Leahy Scale: Good Sitting balance - Comments: Pt sat EOB and engaged in MMT assessment as well as donned sock on Rt foot without LOB.   Standing balance support: Bilateral upper extremity supported, During functional activity, Reliant on assistive device for balance Standing balance-Leahy Scale:  Poor Standing balance comment: Pt dependent on RW                           ADL either performed or  assessed with clinical judgement   ADL Overall ADL's : Needs assistance/impaired Eating/Feeding: Set up;Sitting   Grooming: Set up;Sitting   Upper Body Bathing: Contact guard assist;Minimal assistance;Cueing for sequencing;Sitting   Lower Body Bathing: Moderate assistance;Maximal assistance;Cueing for sequencing;Sitting/lateral leans;Sit to/from stand;+2 for safety/equipment   Upper Body Dressing : Contact guard assist;Cueing for sequencing;Sitting   Lower Body Dressing: Supervision/safety;Sitting/lateral leans;Maximal assistance;Sit to/from stand Lower Body Dressing Details (indicate cue type and reason): able to donn sock in sitting at EOB with Supervision; requires Max assist to raise/lower clothing over hips in sitting/standing Toilet Transfer: Stand-pivot;Minimal assistance;+2 for safety/equipment;BSC/3in1;Rolling walker (2 wheels) Toilet Transfer Details (indicate cue type and reason): simulated to recliner Toileting- Clothing Manipulation and Hygiene: Total assistance;Sit to/from stand               Vision Patient Visual Report: No change from baseline Additional Comments: WFL for tasks assessed; not formally screened or evaluated     Perception         Praxis         Pertinent Vitals/Pain Pain Assessment Pain Assessment: No/denies pain     Extremity/Trunk Assessment Upper Extremity Assessment Upper Extremity Assessment: Right hand dominant;RUE deficits/detail;LUE deficits/detail;Generalized weakness RUE Deficits / Details: generalized weakness; mildly decreased fine motor coordination RUE Sensation: WNL RUE Coordination: decreased fine motor (mild) LUE Deficits / Details: generalized weakness; mildly decreased shoulder AROM; decreased fine and gross motor coordiantion; pt with hx of CVA with subsequent L side weakness LUE Sensation: WNL LUE Coordination: decreased fine motor;decreased gross motor   Lower Extremity Assessment Lower Extremity Assessment:  Defer to PT evaluation   Cervical / Trunk Assessment Cervical / Trunk Assessment: Normal   Communication Communication Communication: No apparent difficulties   Cognition Arousal: Alert Behavior During Therapy: WFL for tasks assessed/performed Cognition: History of cognitive impairments, Cognition impaired   Orientation impairments: Time, Situation (Pt able to state he is at [facility], but not able to state why he is here.) Awareness: Intellectual awareness intact, Online awareness impaired Memory impairment (select all impairments): Short-term memory, Working memory, Engineer, structural memory Attention impairment (select first level of impairment): Selective attention Executive functioning impairment (select all impairments): Organization, Sequencing, Reasoning, Problem solving OT - Cognition Comments: Pt with diagnosis of vascular dementia with wife reporting pt cognition is currently at baseline.                 Following commands: Intact Following commands impaired: Follows one step commands with increased time, Follows multi-step commands with increased time     Cueing  General Comments   Cueing Techniques: Verbal cues;Gestural cues;Visual cues  Right fifth great toe with discoloration and gangrene. Wife present and supporitive throughout session.   Exercises     Shoulder Instructions      Home Living Family/patient expects to be discharged to:: Private residence Living Arrangements: Spouse/significant other Available Help at Discharge: Family;Available PRN/intermittently Type of Home: House Home Access: Stairs to enter;Ramped entrance (wife reports pt typically uses ramped entrance)     Home Layout: One level     Bathroom Shower/Tub: Chief Strategy Officer: Handicapped height Bathroom Accessibility: Yes How Accessible: Accessible via walker Home Equipment: Shower seat;Grab bars - tub/shower;Rolling Walker (2 wheels);Hand held shower head;Rollator  (4 wheels);Wheelchair - manual;Other (comment) (upright  walker)   Additional Comments: Wife has cameras in the house so she can check in on him whenever she is out and about. History and Prior Funciton largely provided by wife due to pt with cognitive deficits at baseline.      Prior Functioning/Environment Prior Level of Function : Needs assist       Physical Assist : Mobility (physical);ADLs (physical) Mobility (physical): Transfers ADLs (physical): Bathing;Dressing;IADLs Mobility Comments: ModI using RW and LLE prosthesis. Wife reports he is very slow and he hates to get up and walk d/t back pain. Pt enters the house using the ramp. Wife assist 50% with transfers including standing and getting in/out of tub. Denies fall history. ADLs Comments: Pt wears adult briefs and is not getting on/off toilet. Wife assists 50% with bathing. Pt is able to manage UB dressing and needs intermittent assist with LB dressing. He is able to don his LLE prosthetic. Pt can feed himself and perform grooming tasks with Set-up. Wife manages all IADLs. Pt loves music!    OT Problem List: Decreased strength;Decreased activity tolerance;Impaired balance (sitting and/or standing);Decreased coordination   OT Treatment/Interventions: Self-care/ADL training;Therapeutic exercise;DME and/or AE instruction;Therapeutic activities;Cognitive remediation/compensation;Patient/family education;Balance training      OT Goals(Current goals can be found in the care plan section)   Acute Rehab OT Goals Patient Stated Goal: to return home OT Goal Formulation: With patient/family Time For Goal Achievement: 08/23/23 Potential to Achieve Goals: Good ADL Goals Pt Will Perform Lower Body Bathing: with min assist;with mod assist;sitting/lateral leans;sit to/from stand Pt Will Perform Upper Body Dressing: with set-up;sitting Pt Will Perform Lower Body Dressing: with min assist;with mod assist;sitting/lateral leans;sit to/from  stand Pt Will Perform Tub/Shower Transfer: Tub transfer;with contact guard assist;ambulating;shower seat;grab bars (with least restrictive AD) Pt/caregiver will Perform Home Exercise Program: Increased strength;Both right and left upper extremity;With minimal assist;With written HEP provided (increased fine and gross motor coordination; AROM progressing to theraband)   OT Frequency:  Min 2X/week    Co-evaluation PT/OT/SLP Co-Evaluation/Treatment: Yes Reason for Co-Treatment: For patient/therapist safety;To address functional/ADL transfers PT goals addressed during session: Mobility/safety with mobility;Balance OT goals addressed during session: ADL's and self-care      AM-PAC OT 6 Clicks Daily Activity     Outcome Measure Help from another person eating meals?: A Little Help from another person taking care of personal grooming?: A Little Help from another person toileting, which includes using toliet, bedpan, or urinal?: Total Help from another person bathing (including washing, rinsing, drying)?: A Lot Help from another person to put on and taking off regular upper body clothing?: A Little Help from another person to put on and taking off regular lower body clothing?: A Lot 6 Click Score: 14   End of Session Equipment Utilized During Treatment: Rolling walker (2 wheels);Gait belt Nurse Communication: Mobility status  Activity Tolerance: Patient tolerated treatment well;Patient limited by fatigue Patient left: in chair;with call bell/phone within reach;with chair alarm set;with family/visitor present  OT Visit Diagnosis: Unsteadiness on feet (R26.81);Other abnormalities of gait and mobility (R26.89);Muscle weakness (generalized) (M62.81)                Time: 8798-8773 OT Time Calculation (min): 25 min Charges:  OT General Charges $OT Visit: 1 Visit OT Evaluation $OT Eval Moderate Complexity: 1 Mod  Margarie Rockey HERO., OTR/L, MA Acute Rehab [phone]   Margarie FORBES Horns 08/09/2023, 2:24 PM

## 2023-08-10 DIAGNOSIS — M869 Osteomyelitis, unspecified: Secondary | ICD-10-CM | POA: Diagnosis not present

## 2023-08-10 LAB — GLUCOSE, CAPILLARY
Glucose-Capillary: 151 mg/dL — ABNORMAL HIGH (ref 70–99)
Glucose-Capillary: 132 mg/dL — ABNORMAL HIGH (ref 70–99)
Glucose-Capillary: 119 mg/dL — ABNORMAL HIGH (ref 70–99)
Glucose-Capillary: 190 mg/dL — ABNORMAL HIGH (ref 70–99)

## 2023-08-10 NOTE — Progress Notes (Signed)
 Please make patient n.p.o. midnight.  Plan for lower extremity angiogram with Dr. Pearline tomorrow.

## 2023-08-10 NOTE — Progress Notes (Signed)
 PROGRESS NOTE   Chad Grant  FMW:[id number] DOB: [birth year] DOA: 08/07/2023 PCP: Maree Isles, MD   Chief Complaint  Patient presents with   Toe Injury   Level of care: Med-Surg  Brief Admission History:  Patient is a 73 year old male past medical history significant for peripheral vascular disease status post left BKA in 2010, status post 3rd and 4th toe amputation, history of CVA with residual left-sided weakness, vascular dementia, diabetes mellitus and hyperlipidemia.  Patient was admitted with right fifth toe gangrene and query osteomyelitis, with associated PVD.  Vascular surgery team has seen patient.  Plan is to proceed with vascular workup in 2 days time, 08/11/2023.  Orthopedic surgery input is appreciated.  Orthopedic surgery will help twice after vascular surgery workup.  08/10/2023: Patient seen.  No new changes.  For lower extremity angiogram tomorrow (as per vascular surgery team).    Assessment and Plan:  Right fifth toe gangrene: - Query osteomyelitis -Vascular surgery and orthopedic surgery input is appreciated. - Follow extremity angiogram tomorrow. - Continue IV cefazolin  and doxycycline . -Orthopedic surgery input is appreciated.  History of CVA chronic left-sided weakness -Consult PT, OT -Hold Plavix  today for possible need of surgery -Continue with statin -Consult PT, OT   Diabetes mellitus -Hold glipizide, will check A1c, and will keep an insulin  sliding scale  CBG (last 3)  Recent Labs    08/09/23 2141 08/10/23 0635 08/10/23 1114  GLUCAP 149* 151* 132*   Hypertension - Continue with home medication include metoprolol  -Blood pressure is controlled.   GERD -Continue with PPI   Vascular dementia - Continue with supportive care,  DVT prophylaxis: sq heparin  Code Status: DNR  Communication:  Disposition: awaiting transfer to [facility]    Consultants:  Orthopedics Dr. Janetta Vascular surgery  Procedures:  TBD  Antimicrobials:   Cefazolin  Doxycycline   Subjective: No new complaints.   Objective: Vitals:   08/09/23 1400 08/09/23 2047 08/10/23 0455 08/10/23 0743  BP: (!) 139/59 127/66 135/71 127/83  Pulse: (!) 56 62 (!) 58 (!) 53  Resp: 18 15 16 16   Temp: 98 F (36.7 C) 97.7 F (36.5 C) 98.6 F (37 C) 97.6 F (36.4 C)  TempSrc: Oral Oral    SpO2: 100% 100% 100% 100%  Weight:      Height:        Intake/Output Summary (Last 24 hours) at 08/10/2023 1210 Last data filed at 08/10/2023 0503 Gross per 24 hour  Intake --  Output 800 ml  Net -800 ml   Filed Weights   08/07/23 1337  Weight: 68.6 kg   Examination:  General exam: Appears calm and comfortable. Respiratory system: Clear to auscultation.  Cardiovascular system: normal S1 & S2 heard. Gastrointestinal system: Abdomen is soft and nontender.   Central nervous system: Awake and alert.   Extremities: Gangrene involving right fifth toe.    Data Reviewed: I have personally reviewed following labs and imaging studies  CBC: Recent Labs  Lab 08/07/23 1535 08/08/23 0604  WBC 7.6 7.2  HGB 12.3* 13.0  HCT 37.4* 38.6*  MCV 90.1 88.7  PLT 183 168    Basic Metabolic Panel: Recent Labs  Lab 08/07/23 1535 08/08/23 0604  NA 140 137  K 4.0 3.9  CL 102 101  CO2 28 27  GLUCOSE 151* 137*  BUN 18 15  CREATININE 0.77 0.71  CALCIUM  9.0 8.8*    CBG: Recent Labs  Lab 08/09/23 1135 08/09/23 1633 08/09/23 2141 08/10/23 0635 08/10/23 1114  GLUCAP 147* 94 149* 151*  132*    Recent Results (from the past 240 hours)  Blood culture (routine x 2)     Status: None (Preliminary result)   Collection Time: 08/07/23  3:35 PM   Specimen: BLOOD LEFT FOREARM  Result Value Ref Range Status   Specimen Description BLOOD LEFT FOREARM  Final   Special Requests   Final    BOTTLES DRAWN AEROBIC AND ANAEROBIC Blood Culture adequate volume   Culture   Final    NO GROWTH 3 DAYS Performed at [facility], [address] [place], [place] [address]     Report Status PENDING  Incomplete  Blood culture (routine x 2)     Status: None (Preliminary result)   Collection Time: 08/07/23  3:40 PM   Specimen: BLOOD LEFT ARM  Result Value Ref Range Status   Specimen Description BLOOD LEFT ARM  Final   Special Requests   Final    BOTTLES DRAWN AEROBIC AND ANAEROBIC Blood Culture results may not be optimal due to an inadequate volume of blood received in culture bottles   Culture   Final    NO GROWTH 3 DAYS Performed at [facility], [address] [place], [place] [address]    Report Status PENDING  Incomplete     Radiology Studies: No results found.   Scheduled Meds:  doxycycline   100 mg Oral Q12H   FLUoxetine   20 mg Oral QHS   gabapentin   100 mg Oral QHS   heparin   5,000 Units Subcutaneous Q8H   insulin  aspart  0-9 Units Subcutaneous TID WC   metoprolol  tartrate  25 mg Oral Daily   rosuvastatin   5 mg Oral QODAY   Continuous Infusions:   ceFAZolin  (ANCEF ) IV 2 g (08/10/23 0503)     LOS: 3 days   Time spent: 35 mins  Leatrice LILLETTE Chapel, MD How to contact the [facility] Attending or Consulting provider 7A - 7P or covering provider during after hours 7P -7A, for this patient?  Check the care team in [facility] and look for a) attending/consulting [facility] provider listed and b) the [facility] team listed Log into [email or web address] to find provider on call.  Locate the [facility] provider you are looking for under [facility] and page to a number that you can be directly reached. If you still have difficulty reaching the provider, please page the [facility] (Director on Call) for the Hospitalists listed on amion for assistance.  08/10/2023, 12:10 PM

## 2023-08-10 NOTE — Plan of Care (Signed)
  Problem: Skin Integrity: Goal: Risk for impaired skin integrity will decrease Outcome: Progressing   Problem: Pain Managment: Goal: General experience of comfort will improve and/or be controlled Outcome: Progressing   Problem: Safety: Goal: Ability to remain free from injury will improve Outcome: Progressing

## 2023-08-11 ENCOUNTER — Encounter (HOSPITAL_COMMUNITY)
Admission: EM | Disposition: A | Discharge status: Skilled Nursing Facility | Payer: Self-pay | Source: Home / Self Care | Attending: Internal Medicine

## 2023-08-11 DIAGNOSIS — M869 Osteomyelitis, unspecified: Secondary | ICD-10-CM | POA: Diagnosis not present

## 2023-08-11 DIAGNOSIS — M86171 Other acute osteomyelitis, right ankle and foot: Secondary | ICD-10-CM

## 2023-08-11 DIAGNOSIS — I70235 Atherosclerosis of native arteries of right leg with ulceration of other part of foot: Secondary | ICD-10-CM

## 2023-08-11 DIAGNOSIS — L97519 Non-pressure chronic ulcer of other part of right foot with unspecified severity: Secondary | ICD-10-CM

## 2023-08-11 DIAGNOSIS — Z89512 Acquired absence of left leg below knee: Secondary | ICD-10-CM

## 2023-08-11 HISTORY — PX: ABDOMINAL AORTOGRAM W/LOWER EXTREMITY: CATH118223

## 2023-08-11 HISTORY — PX: LOWER EXTREMITY INTERVENTION: CATH118252

## 2023-08-11 LAB — GLUCOSE, CAPILLARY
Glucose-Capillary: 121 mg/dL — ABNORMAL HIGH (ref 70–99)
Glucose-Capillary: 90 mg/dL (ref 70–99)
Glucose-Capillary: 121 mg/dL — ABNORMAL HIGH (ref 70–99)
Glucose-Capillary: 119 mg/dL — ABNORMAL HIGH (ref 70–99)
Glucose-Capillary: 160 mg/dL — ABNORMAL HIGH (ref 70–99)

## 2023-08-11 LAB — SURGICAL PCR SCREEN
MRSA, PCR: NEGATIVE
Staphylococcus aureus: NEGATIVE

## 2023-08-11 SURGERY — ABDOMINAL AORTOGRAM W/LOWER EXTREMITY
Anesthesia: LOCAL

## 2023-08-11 MED ORDER — ASPIRIN 81 MG PO CHEW
CHEWABLE_TABLET | ORAL | Status: DC | PRN
  Administered 2023-08-11: 81 mg via ORAL

## 2023-08-11 MED ORDER — HEPARIN SODIUM (PORCINE) 1000 UNIT/ML IJ SOLN
INTRAMUSCULAR | Status: AC
  Filled 2023-08-11: qty 10

## 2023-08-11 MED ORDER — SODIUM CHLORIDE 0.9% FLUSH: 3.0000 mL | INTRAVENOUS | Status: DC | PRN

## 2023-08-11 MED ORDER — IODIXANOL 320 MG/ML IV SOLN
INTRAVENOUS | Status: DC | PRN
  Administered 2023-08-11: 55 mL

## 2023-08-11 MED ORDER — CLOPIDOGREL BISULFATE 300 MG PO TABS
ORAL_TABLET | ORAL | Status: AC
  Filled 2023-08-11: qty 1

## 2023-08-11 MED ORDER — SODIUM CHLORIDE 0.9 % IV SOLN: INTRAVENOUS | Status: DC

## 2023-08-11 MED ORDER — SODIUM CHLORIDE 0.9% FLUSH
3.0000 mL | Freq: Two times a day (BID) | INTRAVENOUS | Status: DC
  Administered 2023-08-11 – 2023-08-17 (×11): 3 mL via INTRAVENOUS

## 2023-08-11 MED ORDER — LIDOCAINE HCL (PF) 1 % IJ SOLN
INTRAMUSCULAR | Status: DC | PRN
Start: 2023-08-11 — End: 2023-08-11
  Administered 2023-08-11: 5 mL

## 2023-08-11 MED ORDER — HEPARIN (PORCINE) IN NACL 2000-0.9 UNIT/L-% IV SOLN
INTRAVENOUS | Status: DC | PRN
Start: 2023-08-11 — End: 2023-08-11
  Administered 2023-08-11: 1000 mL

## 2023-08-11 MED ORDER — FENTANYL CITRATE (PF) 100 MCG/2ML IJ SOLN
INTRAMUSCULAR | Status: AC
  Filled 2023-08-11: qty 2

## 2023-08-11 MED ORDER — SODIUM CHLORIDE 0.9 % IV SOLN: 250.0000 mL | INTRAVENOUS | Status: AC | PRN

## 2023-08-11 MED ORDER — ASPIRIN 81 MG PO CHEW
CHEWABLE_TABLET | ORAL | Status: AC
  Filled 2023-08-11: qty 1

## 2023-08-11 MED ORDER — CLOPIDOGREL BISULFATE 300 MG PO TABS
ORAL_TABLET | ORAL | Status: DC | PRN
  Administered 2023-08-11: 300 mg via ORAL

## 2023-08-11 MED ORDER — ASPIRIN 81 MG PO TBEC
81.0000 mg | DELAYED_RELEASE_TABLET | Freq: Every day | ORAL | Status: DC
  Administered 2023-08-12 – 2023-08-17 (×5): 81 mg via ORAL
  Filled 2023-08-11 (×5): qty 1

## 2023-08-11 MED ORDER — SODIUM CHLORIDE 0.9 % WEIGHT BASED INFUSION: 1.0000 mL/kg/h | INTRAVENOUS | Status: AC

## 2023-08-11 MED ORDER — LIDOCAINE HCL (PF) 1 % IJ SOLN
INTRAMUSCULAR | Status: AC
  Filled 2023-08-11: qty 30

## 2023-08-11 MED ORDER — FENTANYL CITRATE (PF) 100 MCG/2ML IJ SOLN
INTRAMUSCULAR | Status: DC | PRN
  Administered 2023-08-11: 50 ug via INTRAVENOUS

## 2023-08-11 MED ORDER — CLOPIDOGREL BISULFATE 75 MG PO TABS
75.0000 mg | ORAL_TABLET | Freq: Every day | ORAL | Status: DC
  Administered 2023-08-12: 75 mg via ORAL
  Filled 2023-08-11: qty 1

## 2023-08-11 MED ORDER — ONDANSETRON HCL 4 MG/2ML IJ SOLN
4.0000 mg | Freq: Four times a day (QID) | INTRAMUSCULAR | Status: DC | PRN
  Administered 2023-08-11 – 2023-08-13 (×2): 4 mg via INTRAVENOUS
  Filled 2023-08-11: qty 2

## 2023-08-11 MED ORDER — HEPARIN SODIUM (PORCINE) 1000 UNIT/ML IJ SOLN
INTRAMUSCULAR | Status: DC | PRN
  Administered 2023-08-11: 6000 [IU] via INTRAVENOUS

## 2023-08-11 SURGICAL SUPPLY — 15 items
CATH CXI SUPP 2.6F 135 ST (CATHETERS) IMPLANT
CATH OMNI FLUSH 5F 65CM (CATHETERS) IMPLANT
CATH SHOCKWAVE E8 4X80 (CATHETERS) IMPLANT
CLOSURE MYNX CONTROL 5F (Vascular Products) IMPLANT
COVER DOME SNAP 22 D (MISCELLANEOUS) IMPLANT
KIT ENCORE 26 ADVANTAGE (KITS) IMPLANT
KIT MICROPUNCTURE NIT STIFF (SHEATH) IMPLANT
KIT SINGLE USE MANIFOLD (KITS) IMPLANT
SET ATX-X65L (MISCELLANEOUS) IMPLANT
SHEATH CATAPULT 5F 45 MP (SHEATH) IMPLANT
SHEATH PINNACLE 5F 10CM (SHEATH) IMPLANT
SHEATH PROBE COVER 6X72 (BAG) IMPLANT
TRAY PV CATH (CUSTOM PROCEDURE TRAY) ×2 IMPLANT
WIRE BENTSON .035X145CM (WIRE) IMPLANT
WIRE HI TORQ COMMND ES.014X300 (WIRE) IMPLANT

## 2023-08-11 NOTE — Progress Notes (Signed)
 PROGRESS NOTE   Chad Grant  FMW:[id number] DOB: 09-19-1950 DOA: 08/07/2023 PCP: Maree Isles, MD   Chief Complaint  Patient presents with   Toe Injury   Level of care: Med-Surg  Brief Admission History:  Patient is a 73 year old male past medical history significant for peripheral vascular disease status post left BKA in 2010, status post 3rd and 4th toe amputation, history of CVA with residual left-sided weakness, vascular dementia, diabetes mellitus and hyperlipidemia.  Patient was admitted with right fifth toe gangrene and query osteomyelitis, with associated PVD.  Vascular surgery team has seen patient.  Plan is to proceed with vascular workup in 2 days time, 08/11/2023.  Orthopedic surgery input is appreciated.  Orthopedic surgery will help twice after vascular surgery workup.  08/11/2023: Patient underwent aortogram and right lower extremity angiogram that revealed the following: Widely patent aorta and bilateral iliac systems. Right common femoral, SFA and profunda are patent although there is evidence of medial calcinosis.  The popliteal artery is also calcified although patent throughout its course.  The TP trunk has an approximate 70% calcific stenosis.  There is one-vessel runoff via the peroneal which has a severe, 90% calcific stenosis in the midsegment.  Severe pedal disease.  As per vascular surgery team, patient had Adequate response to intravascular lithotripsy of the peroneal and TP trunk lesions, one-vessel runoff via the peroneal.  Severe pedal disease with no visualization of the PT and a minimal dorsalis pedis in the foot. Continues to be high risk for major amputation.     Assessment and Plan:  Right fifth toe gangrene: - Query osteomyelitis -Vascular surgery and orthopedic surgery input is appreciated. - See the result of aortogram and right lower extremity angiogram as documented above.   - Further workup as per orthopedic team.    History of CVA chronic  left-sided weakness -Consult PT, OT -Hold Plavix  today for possible need of surgery -Continue with statin -Consult PT, OT   Diabetes mellitus -Hold glipizide, will check A1c, and will keep an insulin  sliding scale  CBG (last 3)  Recent Labs    08/10/23 2116 08/11/23 0508 08/11/23 0809  GLUCAP 190* 121* 90   Hypertension - Continue with home medication include metoprolol  -Blood pressure is controlled.   GERD -Continue with PPI   Vascular dementia - Continue with supportive care,  DVT prophylaxis: sq heparin  Code Status: DNR  Communication:  Disposition: awaiting transfer to [facility]    Consultants:  Orthopedics Dr. Janetta Vascular surgery  Procedures:  TBD  Antimicrobials:  Cefazolin  Doxycycline   Subjective: No new complaints.   Objective: Vitals:   08/10/23 0743 08/10/23 2012 08/11/23 0426 08/11/23 0920  BP: 127/83 (!) 103/44 123/71 122/69  Pulse: (!) 53 (!) 55 61 (!) 52  Resp: 16 18 18 18   Temp: 97.6 F (36.4 C) 99 F (37.2 C) 98.7 F (37.1 C)   TempSrc:  Oral Oral   SpO2: 100% 100% 100% 100%  Weight:      Height:        Intake/Output Summary (Last 24 hours) at 08/11/2023 1028 Last data filed at 08/10/2023 1642 Gross per 24 hour  Intake --  Output 400 ml  Net -400 ml   Filed Weights   08/07/23 1337  Weight: 68.6 kg   Examination:  General exam: Appears calm and comfortable. Respiratory system: Clear to auscultation.  Cardiovascular system: normal S1 & S2 heard. Gastrointestinal system: Abdomen is soft and nontender.   Central nervous system: Awake and alert.   Extremities:  Gangrene involving right fifth toe.    Data Reviewed: I have personally reviewed following labs and imaging studies  CBC: Recent Labs  Lab 08/07/23 1535 08/08/23 0604  WBC 7.6 7.2  HGB 12.3* 13.0  HCT 37.4* 38.6*  MCV 90.1 88.7  PLT 183 168    Basic Metabolic Panel: Recent Labs  Lab 08/07/23 1535 08/08/23 0604  NA 140 137  K 4.0 3.9  CL 102 101  CO2  28 27  GLUCOSE 151* 137*  BUN 18 15  CREATININE 0.77 0.71  CALCIUM  9.0 8.8*    CBG: Recent Labs  Lab 08/10/23 1114 08/10/23 1633 08/10/23 2116 08/11/23 0508 08/11/23 0809  GLUCAP 132* 119* 190* 121* 90    Recent Results (from the past 240 hours)  Blood culture (routine x 2)     Status: None (Preliminary result)   Collection Time: 08/07/23  3:35 PM   Specimen: BLOOD LEFT FOREARM  Result Value Ref Range Status   Specimen Description BLOOD LEFT FOREARM  Final   Special Requests   Final    BOTTLES DRAWN AEROBIC AND ANAEROBIC Blood Culture adequate volume   Culture   Final    NO GROWTH 4 DAYS Performed at [facility], [address] [place], [place] [address]    Report Status PENDING  Incomplete  Blood culture (routine x 2)     Status: None (Preliminary result)   Collection Time: 08/07/23  3:40 PM   Specimen: BLOOD LEFT ARM  Result Value Ref Range Status   Specimen Description BLOOD LEFT ARM  Final   Special Requests   Final    BOTTLES DRAWN AEROBIC AND ANAEROBIC Blood Culture results may not be optimal due to an inadequate volume of blood received in culture bottles   Culture   Final    NO GROWTH 4 DAYS Performed at [facility], [address] [place], [place] [address]    Report Status PENDING  Incomplete  Surgical pcr screen     Status: None   Collection Time: 08/10/23  5:39 PM   Specimen: Nasal Mucosa; Nasal Swab  Result Value Ref Range Status   MRSA, PCR NEGATIVE NEGATIVE Final   Staphylococcus aureus NEGATIVE NEGATIVE Final    Comment: (NOTE) The Xpert SA Assay (FDA approved for NASAL specimens in patients 1 years of age and older), is one component of a comprehensive surveillance program. It is not intended to diagnose infection nor to guide or monitor treatment. Performed at [facility], [address] [place], [place] [address]      Radiology Studies: No results found.   Scheduled Meds:  [MAR Hold] doxycycline   100 mg Oral Q12H   [MAR  Hold] FLUoxetine   20 mg Oral QHS   [MAR Hold] gabapentin   100 mg Oral QHS   [MAR Hold] heparin   5,000 Units Subcutaneous Q8H   [MAR Hold] insulin  aspart  0-9 Units Subcutaneous TID WC   [MAR Hold] metoprolol  tartrate  25 mg Oral Daily   [MAR Hold] rosuvastatin   5 mg Oral QODAY   Continuous Infusions:  sodium chloride  100 mL/hr at 08/11/23 0505   [MAR Hold]  ceFAZolin  (ANCEF ) IV 2 g (08/11/23 0506)     LOS: 4 days   Time spent: 35 mins  Leatrice LILLETTE Chapel, MD How to contact the [facility] Attending or Consulting provider 7A - 7P or covering provider during after hours 7P -7A, for this patient?  Check the care team in [facility] and look for a) attending/consulting [facility] provider  listed and b) the [facility] team listed Log into [email or web address] to find provider on call.  Locate the [facility] provider you are looking for under [facility] and page to a number that you can be directly reached. If you still have difficulty reaching the provider, please page the [facility] (Director on Call) for the Hospitalists listed on amion for assistance.  08/11/2023, 10:28 AM

## 2023-08-11 NOTE — Plan of Care (Signed)
  Problem: Nutritional: Goal: Maintenance of adequate nutrition will improve Outcome: Progressing   Problem: Skin Integrity: Goal: Risk for impaired skin integrity will decrease Outcome: Progressing   Problem: Health Behavior/Discharge Planning: Goal: Ability to manage health-related needs will improve Outcome: Not Met (add Reason) Note: Dementia

## 2023-08-11 NOTE — Progress Notes (Signed)
  Daily Progress Note   Subjective: No complaints  Objective: Vitals:   08/11/23 0426 08/11/23 0920  BP: 123/71 122/69  Pulse: 61 (!) 52  Resp: 18 18  Temp: 98.7 F (37.1 C)   SpO2: 100% 100%    Physical Examination HDS Nonlabored breathing Nonpalpable pedal pulses on R, L BKA  ABI IMPRESSION: 1. ABI value is within the normal range, however dampened monophasic appearing waveforms at the right ankle and foot, findings could be secondary to false normal ABI secondary to diffuse calcific disease the foot and ankle.   ASSESSMENT/PLAN:  73 year old male with CL TI and osteomyelitis of the right fifth toe.  ABIs demonstrate monophasic waveforms and are likely falsely elevated due to calcific disease.  Risk and benefits of angiogram with intervention were reviewed and he elects to proceed.   Norman GORMAN Serve MD [facility] [phone] 08/11/2023  10:56 AM

## 2023-08-11 NOTE — Op Note (Signed)
 Patient name: Chad Grant MRN: [id number] DOB: 1951-01-12 Sex: male  08/11/2023 Pre-operative Diagnosis: CLTI with osteomyelitis of right 5th toe wound Post-operative diagnosis:  Same Surgeon:  Norman GORMAN Serve, MD Procedure Performed:  Ultrasound-guided access of the left common femoral artery Aortogram and right lower extremity angiogram Third order cannulation of right peroneal artery Intravascular lithotripsy of the right peroneal and TP trunk, 4 mm E8 shockwave Mynx closure of the left common femoral artery    Indications: 73 year old male with CL TI and osteomyelitis of the right fifth toe.  He has a significant history of PAD and has a left BKA.  Recent benefits of angiogram with intervention were reviewed, he expressed understanding and elected to proceed.  Findings:  Widely patent aorta and bilateral iliac systems. Right common femoral, SFA and profunda are patent although there is evidence of medial calcinosis.  The popliteal artery is also calcified although patent throughout its course.  The TP trunk has an approximate 70% calcific stenosis.  There is one-vessel runoff via the peroneal which has a severe, 90% calcific stenosis in the midsegment.  Severe pedal disease.   Procedure:  The patient was identified in the holding area and taken to the cath lab  The patient was then placed supine on the table and prepped and draped in the usual sterile fashion.  A time out was called.  Ultrasound was used to evaluate the left common femoral artery.  It was patent .  A digital ultrasound image was acquired.  A micropuncture needle was used to access the left common femoral artery under ultrasound guidance.  An 018 wire was advanced without resistance and a micropuncture sheath was placed.  The 018 wire was removed and a benson wire was placed.  The micropuncture sheath was exchanged for a 5 french sheath.  An omniflush catheter was advanced over the wire to the level of L-1.  An  abdominal angiogram was obtained.  Next, using the omniflush catheter and a Bentson wire, the aortic bifurcation was crossed and the catheter was placed into theright external iliac artery and right runoff was obtained. This demonstrated the above findings.  The Bentson wire was then replaced through the catheter and into the right SFA.  The patient was systemically heparinized and the short 5 French sheath was exchanged for a 5 [place] by 45 cm catapult sheath.  Using an 014 command wire and a CXI catheter I was able to navigate across the TP trunk and peroneal lesions.  An angiogram via the catheter demonstrated true lumen access distally.  The wire was replaced to the catheter and the lesion was treated with the 4 mm E8 shockwave balloon.  4 treatments were delivered to the peroneal stenosis and 4 treatments were delivered to the TP trunk stenosis.  Completion angiogram demonstrated an adequate result with an approximate 30% residual stenosis in the TP trunk and a less than 20% residual stenosis in the peroneal with some nonflow limiting dissection.  It should be noted that he has severe pedal disease and this high risk for amputation.  The Bentson wire was placed through the sheath.  The long 5 French sheath was exchanged for a short 5 [place] sheath and a minx closure device was deployed with excellent hemostasis.  Contrast: 55 cc   Impression: Adequate response to intravascular lithotripsy of the peroneal and TP trunk lesions, one-vessel runoff via the peroneal.  Severe pedal disease with no visualization of the PT and a minimal dorsalis pedis  in the foot. Continues to be high risk for major amputation.   Norman GORMAN Serve MD [facility] [place] Office: [phone]

## 2023-08-12 ENCOUNTER — Encounter (HOSPITAL_COMMUNITY): Payer: Self-pay | Admitting: Vascular Surgery

## 2023-08-12 DIAGNOSIS — Z9862 Peripheral vascular angioplasty status: Secondary | ICD-10-CM

## 2023-08-12 DIAGNOSIS — M869 Osteomyelitis, unspecified: Secondary | ICD-10-CM | POA: Diagnosis not present

## 2023-08-12 DIAGNOSIS — S91104A Unspecified open wound of right lesser toe(s) without damage to nail, initial encounter: Secondary | ICD-10-CM

## 2023-08-12 LAB — CULTURE, BLOOD (ROUTINE X 2)
Culture: NO GROWTH
Culture: NO GROWTH
Special Requests: ADEQUATE

## 2023-08-12 LAB — LIPID PANEL
Cholesterol: 140 mg/dL (ref 0–200)
HDL: 36 mg/dL — ABNORMAL LOW (ref >40)
LDL Cholesterol: 86 mg/dL (ref 0–99)
Total CHOL/HDL Ratio: 3.9 ratio
Triglycerides: 89 mg/dL (ref <150)
VLDL: 18 mg/dL (ref 0–40)

## 2023-08-12 LAB — COMPREHENSIVE METABOLIC PANEL WITH GFR
ALT: 7 U/L (ref 0–44)
AST: 19 U/L (ref 15–41)
Albumin: 3.2 g/dL — ABNORMAL LOW (ref 3.5–5.0)
Alkaline Phosphatase: 81 U/L (ref 38–126)
Anion gap: 8 (ref 5–15)
BUN: 9 mg/dL (ref 8–23)
CO2: 28 mmol/L (ref 22–32)
Calcium: 9.1 mg/dL (ref 8.9–10.3)
Chloride: 99 mmol/L (ref 98–111)
Creatinine, Ser: 0.69 mg/dL (ref 0.61–1.24)
GFR, Estimated: >60 mL/min (ref >60)
Glucose, Bld: 128 mg/dL — ABNORMAL HIGH (ref 70–99)
Potassium: 4 mmol/L (ref 3.5–5.1)
Sodium: 135 mmol/L (ref 135–145)
Total Bilirubin: 1 mg/dL (ref 0.0–1.2)
Total Protein: 6.6 g/dL (ref 6.5–8.1)

## 2023-08-12 LAB — GLUCOSE, CAPILLARY
Glucose-Capillary: 120 mg/dL — ABNORMAL HIGH (ref 70–99)
Glucose-Capillary: 198 mg/dL — ABNORMAL HIGH (ref 70–99)
Glucose-Capillary: 119 mg/dL — ABNORMAL HIGH (ref 70–99)
Glucose-Capillary: 207 mg/dL — ABNORMAL HIGH (ref 70–99)

## 2023-08-12 LAB — CBC
HCT: 36.4 % — ABNORMAL LOW (ref 39.0–52.0)
Hemoglobin: 12 g/dL — ABNORMAL LOW (ref 13.0–17.0)
MCH: 28.8 pg (ref 26.0–34.0)
MCHC: 33 g/dL (ref 30.0–36.0)
MCV: 87.3 fL (ref 80.0–100.0)
Platelets: 151 K/uL (ref 150–400)
RBC: 4.17 MIL/uL — ABNORMAL LOW (ref 4.22–5.81)
RDW: 12.8 % (ref 11.5–15.5)
WBC: 7.4 K/uL (ref 4.0–10.5)
nRBC: 0 % (ref 0.0–0.2)

## 2023-08-12 MED ORDER — POVIDONE-IODINE 10 % EX SWAB
2.0000 | Freq: Once | CUTANEOUS | Status: AC
  Administered 2023-08-13: 2 via TOPICAL

## 2023-08-12 MED ORDER — CHLORHEXIDINE GLUCONATE 4 % EX SOLN
60.0000 mL | Freq: Once | CUTANEOUS | Status: AC
  Administered 2023-08-12: 4 via TOPICAL
  Filled 2023-08-12: qty 60

## 2023-08-12 MED ORDER — CEFAZOLIN SODIUM-DEXTROSE 2-4 GM/100ML-% IV SOLN
2.0000 g | INTRAVENOUS | Status: AC
  Administered 2023-08-13: 2 g via INTRAVENOUS
  Filled 2023-08-12: qty 100

## 2023-08-12 NOTE — H&P (View-Only) (Signed)
 Patient ID: Chad Grant, male   DOB: May 23, 1950, 73 y.o.   MRN: [id number] Patient is seen in follow-up for dry gangrene right foot little toe status post previous fourth toe amputation.  Patient is status post endovascular revascularization yesterday.  Patient has decreased microcirculation to the forefoot.  Discussed with patient will plan for a right foot fifth ray amputation.  Risks and benefits were discussed including risk of the wound not healing need for a higher level amputation.  Patient states she understands wished to proceed.  Will call patient's wife to discuss surgical recommendation.  Plan for surgery tomorrow Wednesday.

## 2023-08-12 NOTE — TOC Progression Note (Signed)
 Transition of Care [facility]) - Progression Note    Patient Details  Name: Chad Grant MRN: [id number] Date of Birth: [birth year]  Transition of [facility]) CM/SW Contact  Nola Devere Hands, RN Phone Number: 08/12/2023, 11:23 AM  Clinical Narrative:    Patient is a 73 yr old gentleman admitted with right foot fifth toe gangrene and possible osteo. Plan is for surgery on 08/13/23 for amputation of r foot fifth toe. Case Manager spoke with patient's wife concerning future discharge need for Home Health therapy. She has no preference, can't remember name of agencies she has used. CM received permission to call referral to [facility]. TOC Team will continue to follow.    Expected Discharge Plan: Home w Home Health Services Barriers to Discharge: Continued Medical Work up  Expected Discharge Plan and Services   Discharge Planning Services: CM Consult Post Acute Care Choice: Home Health Living arrangements for the past 2 months: Single Family Home                   DME Agency: NA       HH Arranged: PT, OT HH Agency: [facility] Date [facility] Agency Contacted: 08/12/23 Time HH Agency Contacted: 1118 Representative spoke with at [facility] Agency: Amy Hyatt   Social Determinants of Health (SDOH) Interventions SDOH Screenings   Food Insecurity: No Food Insecurity (08/08/2023)  Housing: Low Risk  (08/08/2023)  Transportation Needs: No Transportation Needs (08/08/2023)  Utilities: Not At Risk (08/08/2023)  Depression (PHQ2-9): Low Risk  (03/28/2021)  Financial Resource Strain: Low Risk  (04/02/2021)   Received from [facility]  Social Connections: Socially Isolated (08/08/2023)  Tobacco Use: Low Risk  (08/07/2023)    Readmission Risk Interventions     No data to display

## 2023-08-12 NOTE — Progress Notes (Signed)
 Patient ID: Chad Grant, male   DOB: May 23, 1950, 73 y.o.   MRN: [id number] Patient is seen in follow-up for dry gangrene right foot little toe status post previous fourth toe amputation.  Patient is status post endovascular revascularization yesterday.  Patient has decreased microcirculation to the forefoot.  Discussed with patient will plan for a right foot fifth ray amputation.  Risks and benefits were discussed including risk of the wound not healing need for a higher level amputation.  Patient states she understands wished to proceed.  Will call patient's wife to discuss surgical recommendation.  Plan for surgery tomorrow Wednesday.

## 2023-08-12 NOTE — Progress Notes (Signed)
 Physical Therapy Treatment Patient Details Name: Chad Grant MRN: [id number] DOB: 12-13-1950 Today's Date: 08/12/2023   History of Present Illness Chad Grant is a 73 y.o. male admitted 08/07/23 for osteomyelitis of the right fifth toe. Plan RLE 5th ray amputation 7/23. PMHx: PVD, s/p L BKA 2010, s/p R 3rd and 4th toe amputation 2014, CVA with residual left-sided weakness, vascular dementia, and DM, HLD.    PT Comments  Pt received in supine, agreeable to therapy session with encouragement, pt needing up to [facility] for sidesteps toward [facility] with LLE prosthetic donned and HHA. Pt with bowel incontinence (seemingly unaware) and RN notified, arriving to give pt bed bath/change linens at end of session. Note plan for RLE 5th ray amputation following date, pt will need PT re-evaluation next session post-op. Pt needs RW brought to room next session for gait progression. Pt continues to benefit from PT services to progress toward functional mobility goals.     If plan is discharge home, recommend the following: A little help with walking and/or transfers;A little help with bathing/dressing/bathroom;Assistance with cooking/housework;Assist for transportation;Help with stairs or ramp for entrance;Supervision due to cognitive status;Direct supervision/assist for medications management;Direct supervision/assist for financial management   Can travel by private vehicle        Equipment Recommendations  None recommended by PT;Other (comment) (Recommend prosthetist evalute LLE for new neoprene sleeve in outpatient setting, his appears loose and is breaking down around the top)    Recommendations for Other Services       Precautions / Restrictions Precautions Precautions: Fall Recall of Precautions/Restrictions: Impaired Precaution/Restrictions Comments: bowel incontinence Restrictions Weight Bearing Restrictions Per Provider Order: No     Mobility  Bed Mobility Overal bed mobility: Needs  Assistance Bed Mobility: Supine to Sit, Sit to Supine     Supine to sit: HOB elevated, Used rails, Min assist Sit to supine: Supervision, HOB elevated, Used rails   General bed mobility comments: Pt sat up on L side of bed with increased time. minA for trunk stability with heavy reliance on L rail and mild L LOB toward HOB, donned LLE prosthetic limb once seated EOB without LOB; Supervision for seated anterior scooting    Transfers Overall transfer level: Needs assistance Equipment used: 1 person hand held assist Transfers: Sit to/from Stand, Bed to chair/wheelchair/BSC Sit to Stand: Min assist   Step pivot transfers: Mod assist       General transfer comment: from bed with no AD and LUE HHA, pt holding bed rail with RUE to push up, sidesteps x3 toward [facility] on his L side, then pt with observed BM in bed, RN notified and pt returned to supine for hygiene assist. x2 RN arriving to assist him at end of session.    Ambulation/Gait                   Stairs             Wheelchair Mobility     Tilt Bed    Modified Rankin (Stroke Patients Only)       Balance Overall balance assessment: Needs assistance Sitting-balance support: No upper extremity supported, Feet supported Sitting balance-Leahy Scale: Good Sitting balance - Comments: Pt sat EOB and able to assist with donning neoprene sleeve, needs some assist to complete this due to finger sensor getting in the way   Standing balance support: Bilateral upper extremity supported, During functional activity, Reliant on assistive device for balance Standing balance-Leahy Scale: Poor Standing balance comment: BUE support  needed (bed rail and HHA)                            Communication Communication Communication: Other (comment) Factors Affecting Communication: Other (comment) (limited verbalizations today, able to say yes/no appropriately in response to questions mostly)  Cognition Arousal:  Alert Behavior During Therapy: Restless, Flat affect   PT - Cognitive impairments: Attention, Safety/Judgement                       PT - Cognition Comments: Pt agreeable, limited verbalizations but following simple commands as able. Pt able to assist with donning LLE prosthetic but needs some assist to place the neoprene sleeve. Following commands: Intact Following commands impaired: Follows one step commands with increased time, Follows multi-step commands with increased time    Cueing Cueing Techniques: Verbal cues, Gestural cues, Visual cues  Exercises Other Exercises Other Exercises: seated AROM: RLE LAQ, LLE knee extension x10 reps ea    General Comments General comments (skin integrity, edema, etc.): SpO2/HR WFL on RA, BP checked seated EOB and WFL.      Pertinent Vitals/Pain Pain Assessment Pain Assessment: No/denies pain    Home Living                          Prior Function            PT Goals (current goals can now be found in the care plan section) Acute Rehab PT Goals Patient Stated Goal: Return Home PT Goal Formulation: With patient/family Time For Goal Achievement: 08/23/23 Progress towards PT goals: Progressing toward goals    Frequency    Min 2X/week      PT Plan      Co-evaluation              AM-PAC PT 6 Clicks Mobility   Outcome Measure  Help needed turning from your back to your side while in a flat bed without using bedrails?: A Little Help needed moving from lying on your back to sitting on the side of a flat bed without using bedrails?: A Little Help needed moving to and from a bed to a chair (including a wheelchair)?: A Lot Help needed standing up from a chair using your arms (e.g., wheelchair or bedside chair)?: A Lot Help needed to walk in hospital room?: A Lot Help needed climbing 3-5 steps with a railing? : Total 6 Click Score: 13    End of Session Equipment Utilized During Treatment: Gait belt;Other  (comment) (LLE prosthetic limb donned) Activity Tolerance: Patient tolerated treatment well;Other (comment) (bowel incontinence and need for hygiene limiting session time) Patient left: in bed;with call bell/phone within reach;with bed alarm set;Other (comment) (RN x2 arriving to his room; chair alarm dropped off so it can be used in next session) Nurse Communication: Mobility status;Other (comment) (pt needs bed bath and new linens placed) PT Visit Diagnosis: Difficulty in walking, not elsewhere classified (R26.2);Other abnormalities of gait and mobility (R26.89)     Time: 8366-8350 PT Time Calculation (min) (ACUTE ONLY): 16 min  Charges:    $Therapeutic Activity: 8-22 mins PT General Charges $$ ACUTE PT VISIT: 1 Visit                     Belisa Eichholz P., PTA Acute Rehabilitation Services Secure Chat Preferred 9a-5:30pm Office: [phone]    Chad Grant [facility] 08/12/2023, 5:06 PM

## 2023-08-12 NOTE — Progress Notes (Signed)
 PHARMACIST LIPID MONITORING   Chad Grant is a 73 y.o. male admitted on 08/07/2023 with osteomyelitis.  Pharmacy has been consulted to optimize lipid-lowering therapy with the indication of secondary prevention for clinical ASCVD.  Recent Labs:  Lipid Panel (last 6 months):   Lab Results  Component Value Date   CHOL 140 08/12/2023   TRIG 89 08/12/2023   HDL 36 (L) 08/12/2023   CHOLHDL 3.9 08/12/2023   VLDL 18 08/12/2023   LDLCALC 86 08/12/2023    Hepatic function panel (last 6 months):   Lab Results  Component Value Date   AST 19 08/12/2023   ALT 7 08/12/2023   ALKPHOS 81 08/12/2023   BILITOT 1.0 08/12/2023    SCr (since admission):   Serum creatinine: 0.69 mg/dL 92/77/74 9161 Estimated creatinine clearance: 78.6 mL/min  Current therapy and lipid therapy tolerance Current lipid-lowering therapy: rosuvastatin  5 mg every other day Previous lipid-lowering therapies (if applicable): N/A Documented or reported allergies or intolerances to lipid-lowering therapies (if applicable): Significant myalgia with previous higher dose statin therapy  Assessment:   Patient LDL of 86 currently above goal. I spoke with patient's family and was informed patient has history of severe myalgias with statin therapy. Due to significant history, will continue rosuvastatin  5 mg every other day at this time.   Plan:   Continue rosuvastatin  5 mg PO every other day.   Izetta Carl, PharmD PGY1 Pharmacy Resident [facility]  08/12/2023 10:50 AM

## 2023-08-12 NOTE — Progress Notes (Signed)
 PROGRESS NOTE  Chad Grant  FMW:[id number] DOB: 1950/12/25 DOA: 08/07/2023 PCP: Maree Isles, MD  Consultants  Brief Narrative: 73 y.o. male with a PMH significant for peripheral vascular disease status post left BKA in 2010, status post 3rd and 4th toe amputation, history of CVA with residual left-sided weakness, vascular dementia, diabetes mellitus and hyperlipidemia.  Patient was admitted with right fifth toe gangrene and query osteomyelitis, with associated PVD.  Vascular and Ortho following.    As per vascular surgery team, patient had Adequate response to intravascular lithotripsy of the peroneal and TP trunk lesions, one-vessel runoff via the peroneal.  Severe pedal disease with no visualization of the PT and a minimal dorsalis pedis in the foot. Continues to be high risk for major amputation.   Assessment and Plan:   Right fifth toe gangrene: - Vascular surgery and orthopedic surgery input is appreciated. - See the result of aortogram and right lower extremity angiogram as documented above.   - Plan is for right foot fifth ray amputation tomorrow 7/23 per orthopedics   History of CVA chronic left-sided weakness -Consult PT, OT -Hold Plavix  due to surgery. -Continue with statin   Diabetes mellitus -Hold glipizide, will check A1c, and will keep an insulin  sliding scale  Hypertension - Continue with home medication include metoprolol  -Blood pressure is controlled.   GERD -Continue with PPI   Vascular dementia - Continue with supportive care,  Normocytic anemia: - Will trend.     DVT prophylaxis:  heparin  injection 5,000 Units Start: 08/08/23 2200  Code Status:   Code Status: Limited: Do not attempt resuscitation (DNR) -DNR-LIMITED -Do Not Intubate/DNI  Family Communication: Wife at bedside, all questions answered Level of care: Progressive Cardiac Status is: Inpatient   Consults called: Vascular surgery, orthopedics  Subjective: Patient and wife seen  today.  No new complaints or concerns.  Plan for surgery tomorrow.  Objective: Vitals:   08/11/23 2320 08/12/23 0354 08/12/23 0819 08/12/23 1123  BP: 118/60 119/63 137/69 98/61  Pulse: 60 (!) 57    Resp: 14 13 12 17   Temp: 97.9 F (36.6 C) 97.7 F (36.5 C) 97.9 F (36.6 C) (!) 97.4 F (36.3 C)  TempSrc: Oral Oral Oral Oral  SpO2: 100% 99% 100% 100%  Weight:      Height:        Intake/Output Summary (Last 24 hours) at 08/12/2023 1541 Last data filed at 08/12/2023 1101 Gross per 24 hour  Intake 240 ml  Output 950 ml  Net -710 ml   Filed Weights   08/07/23 1337  Weight: 68.6 kg   Body mass index is 22.01 kg/m.  Gen: 73 y.o. male in no apparent distress.  Nontoxic, appears, comfortable Pulm: Non-labored breathing.  Clear to auscultation bilaterally.  CV: Regular rate and rhythm. GI: Abdomen soft, non-tender, non-distended Ext: Right gangrene involving right fifth toe.  Status post fourth toe amputation Neuro: Alert  Psych: Calm     I have personally reviewed the following labs and images: CBC: Recent Labs  Lab 08/07/23 1535 08/08/23 0604 08/12/23 0838  WBC 7.6 7.2 7.4  HGB 12.3* 13.0 12.0*  HCT 37.4* 38.6* 36.4*  MCV 90.1 88.7 87.3  PLT 183 168 151   BMP &GFR Recent Labs  Lab 08/07/23 1535 08/08/23 0604 08/12/23 0838  NA 140 137 135  K 4.0 3.9 4.0  CL 102 101 99  CO2 28 27 28   GLUCOSE 151* 137* 128*  BUN 18 15 9   CREATININE 0.77 0.71 0.69  CALCIUM  9.0 8.8* 9.1   Estimated Creatinine Clearance: 78.6 mL/min (by C-G formula based on SCr of 0.69 mg/dL). Liver & Pancreas: Recent Labs  Lab 08/12/23 0838  AST 19  ALT 7  ALKPHOS 81  BILITOT 1.0  PROT 6.6  ALBUMIN 3.2*   No results for input(s): LIPASE, AMYLASE in the last 168 hours. No results for input(s): AMMONIA in the last 168 hours. Diabetic: No results for input(s): HGBA1C in the last 72 hours. Recent Labs  Lab 08/11/23 1252 08/11/23 1711 08/11/23 2124 08/12/23 0623  08/12/23 1121  GLUCAP 121* 119* 160* 120* 198*   Cardiac Enzymes: No results for input(s): CKTOTAL, CKMB, CKMBINDEX, TROPONINI in the last 168 hours. No results for input(s): PROBNP in the last 8760 hours. Coagulation Profile: No results for input(s): INR, PROTIME in the last 168 hours. Thyroid  Function Tests: No results for input(s): TSH, T4TOTAL, FREET4, T3FREE, THYROIDAB in the last 72 hours. Lipid Profile: Recent Labs    08/12/23 0336  CHOL 140  HDL 36*  LDLCALC 86  TRIG 89  CHOLHDL 3.9   Anemia Panel: No results for input(s): VITAMINB12, FOLATE, FERRITIN, TIBC, IRON, RETICCTPCT in the last 72 hours. Urine analysis: No results found for: COLORURINE, APPEARANCEUR, LABSPEC, PHURINE, GLUCOSEU, HGBUR, BILIRUBINUR, KETONESUR, PROTEINUR, UROBILINOGEN, NITRITE, LEUKOCYTESUR Sepsis Labs: Invalid input(s): PROCALCITONIN, LACTICIDVEN  Microbiology: Recent Results (from the past 240 hours)  Blood culture (routine x 2)     Status: None   Collection Time: 08/07/23  3:35 PM   Specimen: BLOOD LEFT FOREARM  Result Value Ref Range Status   Specimen Description BLOOD LEFT FOREARM  Final   Special Requests   Final    BOTTLES DRAWN AEROBIC AND ANAEROBIC Blood Culture adequate volume   Culture   Final    NO GROWTH 5 DAYS Performed at [facility], [address] [place], [place] [address]    Report Status 08/12/2023 FINAL  Final  Blood culture (routine x 2)     Status: None   Collection Time: 08/07/23  3:40 PM   Specimen: BLOOD LEFT ARM  Result Value Ref Range Status   Specimen Description BLOOD LEFT ARM  Final   Special Requests   Final    BOTTLES DRAWN AEROBIC AND ANAEROBIC Blood Culture results may not be optimal due to an inadequate volume of blood received in culture bottles   Culture   Final    NO GROWTH 5 DAYS Performed at [facility], [address] [place], [place] [address]    Report Status 08/12/2023 FINAL   Final  Surgical pcr screen     Status: None   Collection Time: 08/10/23  5:39 PM   Specimen: Nasal Mucosa; Nasal Swab  Result Value Ref Range Status   MRSA, PCR NEGATIVE NEGATIVE Final   Staphylococcus aureus NEGATIVE NEGATIVE Final    Comment: (NOTE) The Xpert SA Assay (FDA approved for NASAL specimens in patients 80 years of age and older), is one component of a comprehensive surveillance program. It is not intended to diagnose infection nor to guide or monitor treatment. Performed at [facility], [address], [place], [place] [address]     Radiology Studies: No results found.  Scheduled Meds:  aspirin  EC  81 mg Oral Daily   clopidogrel   75 mg Oral Q breakfast   doxycycline   100 mg Oral Q12H   FLUoxetine   20 mg Oral QHS   gabapentin   100 mg Oral QHS   heparin   5,000 Units Subcutaneous Q8H  insulin  aspart  0-9 Units Subcutaneous TID WC   metoprolol  tartrate  25 mg Oral Daily   rosuvastatin   5 mg Oral QODAY   sodium chloride  flush  3 mL Intravenous Q12H   Continuous Infusions:  sodium chloride  Stopped (08/11/23 1321)   sodium chloride       ceFAZolin  (ANCEF ) IV 2 g (08/12/23 1507)     LOS: 5 days   35 minutes with more than 50% spent in reviewing records, counseling patient/family and coordinating care.  Reyes VEAR Gaw, MD [facility] [email or web address] 08/12/2023, 3:41 PM

## 2023-08-12 NOTE — Progress Notes (Addendum)
  Progress Note    08/12/2023 7:07 AM 1 Day Post-Op  Subjective:  says his foot feels better  Afebrile   Vitals:   08/11/23 2320 08/12/23 0354  BP: 118/60 119/63  Pulse: 60 (!) 57  Resp: 14 13  Temp: 97.9 F (36.6 C) 97.7 F (36.5 C)  SpO2: 100% 99%    Physical Exam: General:  no distress Lungs:  non labored Incisions:  left groin is soft without hematoma Extremities:  + monophasic doppler flow right peroneal and DP just proximal to the toes.     CBC    Component Value Date/Time   WBC 7.2 08/08/2023 0604   RBC 4.35 08/08/2023 0604   HGB 13.0 08/08/2023 0604   HCT 38.6 (L) 08/08/2023 0604   PLT 168 08/08/2023 0604   MCV 88.7 08/08/2023 0604   MCH 29.9 08/08/2023 0604   MCHC 33.7 08/08/2023 0604   RDW 12.6 08/08/2023 0604   LYMPHSABS 1.9 05/13/2018 1052   MONOABS 0.5 05/13/2018 1052   EOSABS 0.1 05/13/2018 1052   BASOSABS 0.0 05/13/2018 1052    BMET    Component Value Date/Time   NA 137 08/08/2023 0604   K 3.9 08/08/2023 0604   CL 101 08/08/2023 0604   CO2 27 08/08/2023 0604   GLUCOSE 137 (H) 08/08/2023 0604   BUN 15 08/08/2023 0604   CREATININE 0.71 08/08/2023 0604   CALCIUM  8.8 (L) 08/08/2023 0604   GFRNONAA >60 08/08/2023 0604   GFRAA >60 05/13/2018 1052    INR No results found for: INR   Intake/Output Summary (Last 24 hours) at 08/12/2023 0707 Last data filed at 08/12/2023 0356 Gross per 24 hour  Intake --  Output 600 ml  Net -600 ml      Assessment/Plan:  73 y.o. male is s/p:  Angiogram with intravascular lithotripsy of the right peroneal and TP trunk via left CFA on 08/11/2023 by Dr. Pearline for right 5th toe wound.   1 Day Post-Op   -pt with + monophasic doppler flow right peroneal and DP.  Subjectively, pain improved.  Calf is soft and non tender -DVT prophylaxis:  sq heparin  -continue asa/plavix /statin -f/u with VVS in 4-6 weeks with RLE arterial duplex   Lucie Apt, PA-C [facility] [phone] 08/12/2023 7:07 AM  VASCULAR STAFF ADDENDUM: I have independently interviewed and examined the patient. I agree with the above.  Maximally revascularized with peroneal lithotripsy on 08/11/23. Poor pedal vasculature.  Remains high risk for nonhealing and major amputation in the future. Plan for follow up in 4-6 weeks.   Norman GORMAN Pearline MD [facility] Phone Number: [phone] 08/12/2023 8:10 AM

## 2023-08-12 NOTE — Plan of Care (Signed)
  Problem: Coping: Goal: Ability to adjust to condition or change in health will improve Outcome: Progressing coping with being in the hospital and anticipated toe amputation.

## 2023-08-13 ENCOUNTER — Other Ambulatory Visit: Payer: Self-pay

## 2023-08-13 ENCOUNTER — Encounter (HOSPITAL_COMMUNITY): Payer: Self-pay | Admitting: Internal Medicine

## 2023-08-13 ENCOUNTER — Inpatient Hospital Stay (HOSPITAL_COMMUNITY): Admitting: Anesthesiology

## 2023-08-13 ENCOUNTER — Encounter (HOSPITAL_COMMUNITY)
Admission: EM | Disposition: A | Discharge status: Skilled Nursing Facility | Payer: Self-pay | Source: Home / Self Care | Attending: Internal Medicine

## 2023-08-13 DIAGNOSIS — I739 Peripheral vascular disease, unspecified: Secondary | ICD-10-CM | POA: Diagnosis not present

## 2023-08-13 DIAGNOSIS — M869 Osteomyelitis, unspecified: Secondary | ICD-10-CM | POA: Diagnosis not present

## 2023-08-13 DIAGNOSIS — I10 Essential (primary) hypertension: Secondary | ICD-10-CM | POA: Diagnosis not present

## 2023-08-13 DIAGNOSIS — E1152 Type 2 diabetes mellitus with diabetic peripheral angiopathy with gangrene: Secondary | ICD-10-CM

## 2023-08-13 HISTORY — PX: AMPUTATION: SHX166

## 2023-08-13 LAB — BASIC METABOLIC PANEL WITH GFR
Anion gap: 8 (ref 5–15)
BUN: 11 mg/dL (ref 8–23)
CO2: 27 mmol/L (ref 22–32)
Calcium: 8.8 mg/dL — ABNORMAL LOW (ref 8.9–10.3)
Chloride: 104 mmol/L (ref 98–111)
Creatinine, Ser: 0.64 mg/dL (ref 0.61–1.24)
GFR, Estimated: >60 mL/min (ref >60)
Glucose, Bld: 171 mg/dL — ABNORMAL HIGH (ref 70–99)
Potassium: 3.8 mmol/L (ref 3.5–5.1)
Sodium: 139 mmol/L (ref 135–145)

## 2023-08-13 LAB — CBC
HCT: 34.7 % — ABNORMAL LOW (ref 39.0–52.0)
Hemoglobin: 11.6 g/dL — ABNORMAL LOW (ref 13.0–17.0)
MCH: 29.4 pg (ref 26.0–34.0)
MCHC: 33.4 g/dL (ref 30.0–36.0)
MCV: 88.1 fL (ref 80.0–100.0)
Platelets: 139 K/uL — ABNORMAL LOW (ref 150–400)
RBC: 3.94 MIL/uL — ABNORMAL LOW (ref 4.22–5.81)
RDW: 12.7 % (ref 11.5–15.5)
WBC: 7.4 K/uL (ref 4.0–10.5)
nRBC: 0 % (ref 0.0–0.2)

## 2023-08-13 LAB — GLUCOSE, CAPILLARY
Glucose-Capillary: 150 mg/dL — ABNORMAL HIGH (ref 70–99)
Glucose-Capillary: 115 mg/dL — ABNORMAL HIGH (ref 70–99)
Glucose-Capillary: 127 mg/dL — ABNORMAL HIGH (ref 70–99)
Glucose-Capillary: 127 mg/dL — ABNORMAL HIGH (ref 70–99)
Glucose-Capillary: 161 mg/dL — ABNORMAL HIGH (ref 70–99)

## 2023-08-13 LAB — NO BLOOD PRODUCTS

## 2023-08-13 SURGERY — AMPUTATION, FOOT, RAY
Anesthesia: General | Site: Toe | Laterality: Right

## 2023-08-13 MED ORDER — LACTATED RINGERS IV SOLN: INTRAVENOUS | Status: DC

## 2023-08-13 MED ORDER — DOXYCYCLINE HYCLATE 100 MG PO TABS
100.0000 mg | ORAL_TABLET | Freq: Two times a day (BID) | ORAL | Status: AC
  Administered 2023-08-13 – 2023-08-14 (×2): 100 mg via ORAL
  Filled 2023-08-13 (×2): qty 1

## 2023-08-13 MED ORDER — CHLORHEXIDINE GLUCONATE 0.12 % MT SOLN
OROMUCOSAL | Status: AC
  Administered 2023-08-13: 15 mL via OROMUCOSAL
  Filled 2023-08-13: qty 15

## 2023-08-13 MED ORDER — PROPOFOL 10 MG/ML IV BOLUS
INTRAVENOUS | Status: AC
Start: 2023-08-13 — End: 2023-08-13
  Filled 2023-08-13: qty 20

## 2023-08-13 MED ORDER — PHENYLEPHRINE 80 MCG/ML (10ML) SYRINGE FOR IV PUSH (FOR BLOOD PRESSURE SUPPORT)
PREFILLED_SYRINGE | INTRAVENOUS | Status: DC | PRN
  Administered 2023-08-13 (×2): 80 ug via INTRAVENOUS

## 2023-08-13 MED ORDER — EPHEDRINE SULFATE-NACL 50-0.9 MG/10ML-% IV SOSY
PREFILLED_SYRINGE | INTRAVENOUS | Status: DC | PRN
  Administered 2023-08-13 (×2): 10 mg via INTRAVENOUS
  Administered 2023-08-13: 5 mg via INTRAVENOUS

## 2023-08-13 MED ORDER — ORAL CARE MOUTH RINSE
15.0000 mL | Freq: Once | OROMUCOSAL | Status: AC
Start: 2023-08-13 — End: 2023-08-13

## 2023-08-13 MED ORDER — CHLORHEXIDINE GLUCONATE 0.12 % MT SOLN: 15.0000 mL | Freq: Once | OROMUCOSAL | Status: AC

## 2023-08-13 MED ORDER — PROPOFOL 10 MG/ML IV BOLUS
INTRAVENOUS | Status: DC | PRN
  Administered 2023-08-13: 70 mg via INTRAVENOUS

## 2023-08-13 SURGICAL SUPPLY — 34 items
BAG COUNTER SPONGE SURGICOUNT (BAG) ×1 IMPLANT
BLADE SAW SGTL MED 73X18.5 STR (BLADE) IMPLANT
BLADE SURG 21 STRL SS (BLADE) ×1 IMPLANT
BNDG COHESIVE 4X5 TAN STRL LF (GAUZE/BANDAGES/DRESSINGS) ×1 IMPLANT
BNDG GAUZE DERMACEA FLUFF 4 (GAUZE/BANDAGES/DRESSINGS) ×1 IMPLANT
CANISTER WOUNDNEG PRESSURE 500 (CANNISTER) IMPLANT
CLEANSER WND VASHE INSTL 34OZ (WOUND CARE) IMPLANT
COVER SURGICAL LIGHT HANDLE (MISCELLANEOUS) ×2 IMPLANT
DRAPE DERMATAC (DRAPES) IMPLANT
DRAPE U-SHAPE 47X51 STRL (DRAPES) ×2 IMPLANT
DRESSING PEEL AND PLC PRVNA 13 (GAUZE/BANDAGES/DRESSINGS) IMPLANT
DRSG ADAPTIC 3X8 NADH LF (GAUZE/BANDAGES/DRESSINGS) ×1 IMPLANT
DURAPREP 26ML APPLICATOR (WOUND CARE) ×1 IMPLANT
ELECTRODE REM PT RTRN 9FT ADLT (ELECTROSURGICAL) ×1 IMPLANT
GAUZE PAD ABD 8X10 STRL (GAUZE/BANDAGES/DRESSINGS) ×2 IMPLANT
GAUZE SPONGE 4X4 12PLY STRL (GAUZE/BANDAGES/DRESSINGS) ×1 IMPLANT
GLOVE BIOGEL PI IND STRL 9 (GLOVE) ×1 IMPLANT
GLOVE SURG ORTHO 9.0 STRL STRW (GLOVE) ×1 IMPLANT
GOWN STRL REUS W/ TWL XL LVL3 (GOWN DISPOSABLE) ×2 IMPLANT
GRAFT SKIN WND MICRO 38 (Tissue) IMPLANT
KIT BASIN OR (CUSTOM PROCEDURE TRAY) ×1 IMPLANT
KIT TURNOVER KIT B (KITS) ×1 IMPLANT
NDL HYPO 22X1.5 SAFETY MO (MISCELLANEOUS) IMPLANT
NEEDLE HYPO 22X1.5 SAFETY MO (MISCELLANEOUS) ×1 IMPLANT
NS IRRIG 1000ML POUR BTL (IV SOLUTION) ×1 IMPLANT
PACK ORTHO EXTREMITY (CUSTOM PROCEDURE TRAY) ×1 IMPLANT
PAD ARMBOARD POSITIONER FOAM (MISCELLANEOUS) ×2 IMPLANT
PENCIL BUTTON HOLSTER BLD 10FT (ELECTRODE) IMPLANT
STOCKINETTE IMPERVIOUS LG (DRAPES) IMPLANT
SUT ETHILON 2 0 PSLX (SUTURE) ×1 IMPLANT
SYR CONTROL 10ML LL (SYRINGE) IMPLANT
TOWEL GREEN STERILE (TOWEL DISPOSABLE) ×1 IMPLANT
TUBE CONNECTING 12X1/4 (SUCTIONS) ×1 IMPLANT
YANKAUER SUCT BULB TIP NO VENT (SUCTIONS) ×1 IMPLANT

## 2023-08-13 NOTE — Plan of Care (Signed)
  Problem: Coping: Goal: Ability to adjust to condition or change in health will improve Outcome: Progressing   Problem: Metabolic: Goal: Ability to maintain appropriate glucose levels will improve Outcome: Progressing   Problem: Education: Goal: Knowledge of General Education information will improve Description: Including pain rating scale, medication(s)/side effects and non-pharmacologic comfort measures Outcome: Progressing   Problem: Clinical Measurements: Goal: Diagnostic test results will improve Outcome: Progressing Goal: Cardiovascular complication will be avoided Outcome: Progressing

## 2023-08-13 NOTE — Progress Notes (Signed)
 Patient brought back to 4E from PACU. VSS. Patient resting comfortably. Family present.

## 2023-08-13 NOTE — Anesthesia Procedure Notes (Signed)
 Procedure Name: LMA Insertion Date/Time: 08/13/2023 1:33 PM  Performed by: Jolynn Mage, CRNAPre-anesthesia Checklist: Patient identified, Emergency Drugs available, Suction available and Patient being monitored Patient Re-evaluated:Patient Re-evaluated prior to induction Oxygen Delivery Method: Circle system utilized Preoxygenation: Pre-oxygenation with 100% oxygen Induction Type: IV induction Ventilation: Mask ventilation without difficulty LMA: LMA flexible inserted LMA Size: 4.0 Number of attempts: 1 Placement Confirmation: positive ETCO2 and breath sounds checked- equal and bilateral Tube secured with: Tape Dental Injury: Teeth and Oropharynx as per pre-operative assessment

## 2023-08-13 NOTE — Anesthesia Preprocedure Evaluation (Signed)
 Anesthesia Evaluation   Patient awake    Reviewed: Allergy & Precautions, NPO status , Patient's Chart, lab work & pertinent test results  History of Anesthesia Complications Negative for: history of anesthetic complications  Airway Mallampati: III  TM Distance: >3 FB Neck ROM: Full    Dental  (+) Dental Advisory Given   Pulmonary neg pulmonary ROS, neg shortness of breath, neg sleep apnea, neg COPD, neg recent URI   breath sounds clear to auscultation       Cardiovascular hypertension, (-) angina + Peripheral Vascular Disease  (-) Past MI  Rhythm:Regular     Neuro/Psych    Depression   Dementia TIACVA    GI/Hepatic negative GI ROS, Neg liver ROS,,,  Endo/Other  diabetes    Renal/GU      Musculoskeletal negative musculoskeletal ROS (+)    Abdominal   Peds  Hematology  (+) Blood dyscrasia, anemia Lab Results      Component                Value               Date                      WBC                      7.4                 08/13/2023                HGB                      11.6 (L)            08/13/2023                HCT                      34.7 (L)            08/13/2023                MCV                      88.1                08/13/2023                PLT                      139 (L)             08/13/2023              Anesthesia Other Findings   Reproductive/Obstetrics                              Anesthesia Physical Anesthesia Plan  ASA: 3  Anesthesia Plan: General   Post-op Pain Management:    Induction: Intravenous  PONV Risk Score and Plan: 2 and Ondansetron   Airway Management Planned: LMA  Additional Equipment: None  Intra-op Plan:   Post-operative Plan: Extubation in OR  Informed Consent: I have reviewed the patients History and Physical, chart, labs and discussed the procedure including the risks, benefits and alternatives for the proposed anesthesia  with the patient or authorized representative who has indicated his/her understanding  and acceptance.     Dental advisory given  Plan Discussed with: CRNA  Anesthesia Plan Comments:          Anesthesia Quick Evaluation

## 2023-08-13 NOTE — Care Management Important Message (Signed)
 Important Message  Patient Details  Name: Chad Grant MRN: [id number] Date of Birth: [birth year]   Important Message Given:  Yes - Medicare IM     Claretta Deed 08/13/2023, 3:19 PM

## 2023-08-13 NOTE — Progress Notes (Signed)
 Occupational Therapy Treatment Patient Details Name: Chad Grant MRN: [id number] DOB: 08-05-1950 Today's Date: 08/13/2023   History of present illness Chad Grant is a 73 y.o. male admitted 08/07/23 for osteomyelitis of the right fifth toe. Plan RLE 5th ray amputation 7/23. PMHx: PVD, s/p L BKA 2010, s/p R 3rd and 4th toe amputation 2014, CVA with residual left-sided weakness, vascular dementia, and DM, HLD.   OT comments  Patient received in supine and agreeable to OT treatment. Patient able to get to EOB with verbal cues and min assist. Patient performed grooming tasks while seated on EOB with setup. Patient was able to donn LLE prosthesis with min assist and performed standing in order for cleaning due to bowel incontinence. Patient performed transfer to chair with min assist to allow for linen change and returned to bed. Discharge recommendations continue to be appropriate with skilled OT to perform re-eval following RLE 5th ray amputation.       If plan is discharge home, recommend the following:  Two people to help with walking and/or transfers;A lot of help with bathing/dressing/bathroom;Assistance with cooking/housework;Direct supervision/assist for medications management;Direct supervision/assist for financial management;Assist for transportation;Help with stairs or ramp for entrance   Equipment Recommendations  None recommended by OT (Pt already has needed equipment)    Recommendations for Other Services      Precautions / Restrictions Precautions Precautions: Fall Recall of Precautions/Restrictions: Impaired Precaution/Restrictions Comments: bowel incontinence Restrictions Weight Bearing Restrictions Per Provider Order: No       Mobility Bed Mobility Overal bed mobility: Needs Assistance Bed Mobility: Supine to Sit, Sit to Supine     Supine to sit: HOB elevated, Used rails, Min assist Sit to supine: Supervision, HOB elevated, Used rails   General bed mobility  comments: verbal cues for technique    Transfers Overall transfer level: Needs assistance Equipment used: Rolling walker (2 wheels) Transfers: Sit to/from Stand, Bed to chair/wheelchair/BSC Sit to Stand: Min assist     Step pivot transfers: Min assist     General transfer comment: transfer from EOB to chair and back with min assist and cues for hand placement     Balance Overall balance assessment: Needs assistance Sitting-balance support: No upper extremity supported, Feet supported Sitting balance-Leahy Scale: Good Sitting balance - Comments: able to perform grooming task and donn prosthesis seated on EOB   Standing balance support: Bilateral upper extremity supported, During functional activity, Reliant on assistive device for balance Standing balance-Leahy Scale: Poor Standing balance comment: reliant on RW for support                           ADL either performed or assessed with clinical judgement   ADL Overall ADL's : Needs assistance/impaired     Grooming: Wash/dry hands;Wash/dry face;Oral care;Set up;Sitting Grooming Details (indicate cue type and reason): EOB     Lower Body Bathing: Maximal assistance;Sit to/from stand Lower Body Bathing Details (indicate cue type and reason): assistance with cleaning following bowel incontinance     Lower Body Dressing: Minimal assistance;Sitting/lateral leans Lower Body Dressing Details (indicate cue type and reason): able assist with sock and required min assist for prosthesis Toilet Transfer: Minimal assistance;Rolling walker (2 wheels) Toilet Transfer Details (indicate cue type and reason): simulated to chair Toileting- Clothing Manipulation and Hygiene: Total assistance;Sit to/from stand         General ADL Comments: patient was unaware of bowel incontinance    Extremity/Trunk Assessment  Vision       Perception     Praxis     Communication Communication Communication:  Impaired Factors Affecting Communication: Other (comment) (limited verbalization and answered yes/no questions)   Cognition Arousal: Alert Behavior During Therapy: Flat affect Cognition: History of cognitive impairments, Cognition impaired   Orientation impairments: Time, Situation, Place Awareness: Intellectual awareness intact, Online awareness impaired Memory impairment (select all impairments): Short-term memory, Working [employer] fast streamer, Engineer, structural memory Attention impairment (select first level of impairment): Selective attention Executive functioning impairment (select all impairments): Organization, Sequencing, Reasoning, Problem solving                   Following commands: Intact Following commands impaired: Follows one step commands with increased time, Follows multi-step commands with increased time      Cueing   Cueing Techniques: Verbal cues, Gestural cues, Visual cues  Exercises      Shoulder Instructions       General Comments      Pertinent Vitals/ Pain       Pain Assessment Pain Assessment: No/denies pain  Home Living                                          Prior Functioning/Environment              Frequency  Min 2X/week        Progress Toward Goals  OT Goals(current goals can now be found in the care plan section)  Progress towards OT goals: Progressing toward goals  Acute Rehab OT Goals Patient Stated Goal: none stated OT Goal Formulation: With patient Time For Goal Achievement: 08/23/23 Potential to Achieve Goals: Good ADL Goals Pt Will Perform Lower Body Bathing: with min assist;with mod assist;sitting/lateral leans;sit to/from stand Pt Will Perform Upper Body Dressing: with set-up;sitting Pt Will Perform Lower Body Dressing: with min assist;with mod assist;sitting/lateral leans;sit to/from stand Pt Will Perform Tub/Shower Transfer: Tub transfer;with contact guard assist;ambulating;shower seat;grab bars  (with least restrictive AD) Pt/caregiver will Perform Home Exercise Program: Increased strength;Both right and left upper extremity;With minimal assist;With written HEP provided (increased fine and gross motor coordination; AROM progressing to theraband)  Plan      Co-evaluation                 AM-PAC OT 6 Clicks Daily Activity     Outcome Measure   Help from another person eating meals?: A Little Help from another person taking care of personal grooming?: A Little Help from another person toileting, which includes using toliet, bedpan, or urinal?: Total Help from another person bathing (including washing, rinsing, drying)?: A Lot Help from another person to put on and taking off regular upper body clothing?: A Little Help from another person to put on and taking off regular lower body clothing?: A Lot 6 Click Score: 14    End of Session Equipment Utilized During Treatment: Gait belt;Rolling walker (2 wheels);Other (comment) (prosthesis)  OT Visit Diagnosis: Unsteadiness on feet (R26.81);Other abnormalities of gait and mobility (R26.89);Muscle weakness (generalized) (M62.81)   Activity Tolerance Patient tolerated treatment well;Patient limited by fatigue   Patient Left in bed;with call bell/phone within reach;with bed alarm set   Nurse Communication Mobility status        Time: 0721-0750 OT Time Calculation (min): 29 min  Charges: OT General Charges $OT Visit: 1 Visit OT Treatments $Self Care/Home Management : 23-37  mins  Dick Laine, OTA Acute Rehabilitation Services  Office [phone]   Jeb LITTIE Laine 08/13/2023, 11:40 AM

## 2023-08-13 NOTE — Op Note (Signed)
 08/13/2023  5:30 PM  PATIENT:  Chad Grant    PRE-OPERATIVE DIAGNOSIS:  gangrene right little toe  POST-OPERATIVE DIAGNOSIS:  Same  PROCEDURE:  AMPUTATION, FOOT, RAY 4th and 5th. Local tissue transfer for wound closure 10 x 3 cm. Application Kerecis micro graft 38 cm to cover a wound surface area greater than 40 cm. Application of peel in place wound VAC sponge.  SURGEON:  Jerona LULLA Sage, MD  PHYSICIAN ASSISTANT:None ANESTHESIA:   General  PREOPERATIVE INDICATIONS:  [name] is a  73 y.o. male with a diagnosis of gangrene right little toe who failed conservative measures and elected for surgical management.    The risks benefits and alternatives were discussed with the patient preoperatively including but not limited to the risks of infection, bleeding, nerve injury, cardiopulmonary complications, the need for revision surgery, among others, and the patient was willing to proceed.  OPERATIVE IMPLANTS:   Implant Name Type Inv. Item Serial No. Manufacturer Lot No. LRB No. Used Action  GRAFT SKIN WND MICRO 38 - [id number] Tissue GRAFT SKIN WND MICRO 38  [employer] [id number] Right 1 Implanted    @ENCIMAGES @  OPERATIVE FINDINGS: Patient had petechial bleeding the soft tissue was atrophic.  OPERATIVE PROCEDURE: Patient was brought to the operating room and underwent a general anesthetic.  After adequate anesthesia obtained patient's right lower extremity was prepped using DuraPrep draped into a sterile field a timeout was called.  A previous dorsal incision was used and this was extended down to a racquet incision around the necrotic fifth toe.  The fifth ray and necrotic fifth toe were then resected in 1 block of tissue.  Patient also had prominent fourth metatarsal with further resection of the fourth metatarsal was also performed.  The wound was irrigated with normal saline followed by Vashe.  Electrocautery was used hemostasis.  The wound bed was filled with 38 cm of  Kerecis micro graft to cover wound surface area greater than 40 cm.  Tissue margins were undermined to allow for local tissue transfer for wound closure 10 x 3 cm.  The peel in place wound VAC sponge was applied this had a good suction fit patient was taken the PACU in stable condition.   DISCHARGE PLANNING:  Antibiotic duration: Continue antibiotics for 24 hours  Weightbearing: Ideally nonweightbearing on the right foot but patient may bear weight through the right heel as needed.  Pain medication: Opioid pathway  Dressing care/ Wound VAC: Continue wound VAC for 1 week  Ambulatory devices: Transfer training  Discharge to: Anticipate discharge to skilled nursing.  Follow-up: In the office 1 week post operative.

## 2023-08-13 NOTE — Transfer of Care (Signed)
 Immediate Anesthesia Transfer of Care Note  Patient: Chad Grant  Procedure(s) Performed: AMPUTATION, FOOT, RAY (Right: Toe)  Patient Location: PACU  Anesthesia Type:General  Level of Consciousness: awake, patient cooperative, and responds to stimulation  Airway & Oxygen Therapy: Patient Spontanous Breathing and Patient connected to face mask oxygen  Post-op Assessment: Report given to RN and Post -op Vital signs reviewed and stable  Post vital signs: Reviewed and stable  Last Vitals:  Vitals Value Taken Time  BP 129/61 08/13/23 14:15  Temp    Pulse 68 08/13/23 14:17  Resp 19 08/13/23 14:17  SpO2 100 % 08/13/23 14:17  Vitals shown include unfiled device data.  Last Pain:  Vitals:   08/13/23 1246  TempSrc:   PainSc: 0-No pain         Complications: No notable events documented.

## 2023-08-13 NOTE — Interval H&P Note (Signed)
 History and Physical Interval Note:  08/13/2023 6:56 AM  Chad Grant  has presented today for surgery, with the diagnosis of gangrene right little toe.  The various methods of treatment have been discussed with the patient and family. After consideration of risks, benefits and other options for treatment, the patient has consented to  Procedure(s) with comments: AMPUTATION, FOOT, RAY (Right) - RIGHT  FOOT 5TH RAY AMPUTATION as a surgical intervention.  The patient's history has been reviewed, patient examined, no change in status, stable for surgery.  I have reviewed the patient's chart and labs.  Questions were answered to the patient's satisfaction.     Shahiem Bedwell V Margerite Impastato

## 2023-08-13 NOTE — Progress Notes (Signed)
 PROGRESS NOTE  Chad Grant  FMW:[id number] DOB: 12/12/1950 DOA: 08/07/2023 PCP: Maree Isles, MD  Consultants  Brief Narrative: 73 y.o. male with a PMH significant for peripheral vascular disease status post left BKA in 2010, status post 3rd and 4th toe amputation, history of CVA with residual left-sided weakness, vascular dementia, diabetes mellitus and hyperlipidemia.  Patient was admitted with right fifth toe gangrene and query osteomyelitis, with associated PVD.  Vascular and Ortho following.    As per vascular surgery team, patient had Adequate response to intravascular lithotripsy of the peroneal and TP trunk lesions, one-vessel runoff via the peroneal.  Severe pedal disease with no visualization of the PT and a minimal dorsalis pedis in the foot. Continues to be high risk for major amputation.   Assessment and Plan:   Right fifth toe gangrene: - Vascular surgery and orthopedic surgery input is appreciated. - See the result of aortogram and right lower extremity angiogram as documented above.   - Plan is for right foot fifth ray amputation later today 7/23. -PT/OT evaluations after surgery. -Family would like patient to go to SNF if at all possible after DC, wife now struggling to care for patient at home, especially in light of this new surgery and prior LLE surgery for which he wears prosthesis   History of CVA chronic left-sided weakness -Consult PT, OT -Hold Plavix  due to surgery. -Continue with statin   Diabetes mellitus -Hold glipizide, will check A1c, and will keep an insulin  sliding scale  Hypertension - Continue with home medication include metoprolol  -Blood pressure is controlled.   GERD -Continue with PPI   Vascular dementia - Continue with supportive care,  Normocytic anemia: - Will trend.    DVT prophylaxis:  heparin  injection 5,000 Units Start: 08/08/23 2200  Code Status:   Code Status: Limited: Do not attempt resuscitation (DNR) -DNR-LIMITED -Do  Not Intubate/DNI  Family Communication: Wife and daughter at bedside, all questions answered Level of care: Progressive Cardiac Status is: Inpatient   Consults called: Vascular surgery, orthopedics  Subjective: No new complaints or concerns other than that he feels hungry.  Plan for surgery later today.  Objective: Vitals:   08/13/23 1411 08/13/23 1415 08/13/23 1430 08/13/23 1445  BP: (!) 130/57 129/61 132/74 133/72  Pulse: 63 62 66 63  Resp: 15 13 14 14   Temp: (!) 97.5 F (36.4 C)   (!) 97.5 F (36.4 C)  TempSrc:      SpO2: 100% 100% 100% 100%  Weight:      Height:        Intake/Output Summary (Last 24 hours) at 08/13/2023 1503 Last data filed at 08/13/2023 1405 Gross per 24 hour  Intake 2075.07 ml  Output 1100 ml  Net 975.07 ml   Filed Weights   08/07/23 1337 08/13/23 1237  Weight: 68.6 kg 68.6 kg   Body mass index is 22.01 kg/m.  Gen: 73 y.o. male in no apparent distress.  Nontoxic, appears, comfortable Pulm: Non-labored breathing.  Clear to auscultation bilaterally.  CV: Regular rate and rhythm. GI: Abdomen soft, non-tender, non-distended Ext: Right gangrene involving right fifth toe.  Status post fourth toe amputation Neuro: Alert  Psych: Calm     I have personally reviewed the following labs and images: CBC: Recent Labs  Lab 08/07/23 1535 08/08/23 0604 08/12/23 0838 08/13/23 0333  WBC 7.6 7.2 7.4 7.4  HGB 12.3* 13.0 12.0* 11.6*  HCT 37.4* 38.6* 36.4* 34.7*  MCV 90.1 88.7 87.3 88.1  PLT 183 168 151 139*  BMP &GFR Recent Labs  Lab 08/07/23 1535 08/08/23 0604 08/12/23 0838 08/13/23 0333  NA 140 137 135 139  K 4.0 3.9 4.0 3.8  CL 102 101 99 104  CO2 28 27 28 27   GLUCOSE 151* 137* 128* 171*  BUN 18 15 9 11   CREATININE 0.77 0.71 0.69 0.64  CALCIUM  9.0 8.8* 9.1 8.8*   Estimated Creatinine Clearance: 78.6 mL/min (by C-G formula based on SCr of 0.64 mg/dL). Liver & Pancreas: Recent Labs  Lab 08/12/23 0838  AST 19  ALT 7  ALKPHOS 81   BILITOT 1.0  PROT 6.6  ALBUMIN 3.2*   No results for input(s): LIPASE, AMYLASE in the last 168 hours. No results for input(s): AMMONIA in the last 168 hours. Diabetic: No results for input(s): HGBA1C in the last 72 hours. Recent Labs  Lab 08/12/23 1614 08/12/23 2116 08/13/23 0625 08/13/23 1244 08/13/23 1420  GLUCAP 119* 207* 150* 115* 127*   Cardiac Enzymes: No results for input(s): CKTOTAL, CKMB, CKMBINDEX, TROPONINI in the last 168 hours. No results for input(s): PROBNP in the last 8760 hours. Coagulation Profile: No results for input(s): INR, PROTIME in the last 168 hours. Thyroid  Function Tests: No results for input(s): TSH, T4TOTAL, FREET4, T3FREE, THYROIDAB in the last 72 hours. Lipid Profile: Recent Labs    08/12/23 0336  CHOL 140  HDL 36*  LDLCALC 86  TRIG 89  CHOLHDL 3.9   Anemia Panel: No results for input(s): VITAMINB12, FOLATE, FERRITIN, TIBC, IRON, RETICCTPCT in the last 72 hours. Urine analysis: No results found for: COLORURINE, APPEARANCEUR, LABSPEC, PHURINE, GLUCOSEU, HGBUR, BILIRUBINUR, KETONESUR, PROTEINUR, UROBILINOGEN, NITRITE, LEUKOCYTESUR Sepsis Labs: Invalid input(s): PROCALCITONIN, LACTICIDVEN  Microbiology: Recent Results (from the past 240 hours)  Blood culture (routine x 2)     Status: None   Collection Time: 08/07/23  3:35 PM   Specimen: BLOOD LEFT FOREARM  Result Value Ref Range Status   Specimen Description BLOOD LEFT FOREARM  Final   Special Requests   Final    BOTTLES DRAWN AEROBIC AND ANAEROBIC Blood Culture adequate volume   Culture   Final    NO GROWTH 5 DAYS Performed at [facility], [address] [place], [place] [address]    Report Status 08/12/2023 FINAL  Final  Blood culture (routine x 2)     Status: None   Collection Time: 08/07/23  3:40 PM   Specimen: BLOOD LEFT ARM  Result Value Ref Range Status   Specimen Description BLOOD LEFT ARM  Final    Special Requests   Final    BOTTLES DRAWN AEROBIC AND ANAEROBIC Blood Culture results may not be optimal due to an inadequate volume of blood received in culture bottles   Culture   Final    NO GROWTH 5 DAYS Performed at [facility], [address], [place], [place] [address]    Report Status 08/12/2023 FINAL  Final  Surgical pcr screen     Status: None   Collection Time: 08/10/23  5:39 PM   Specimen: Nasal Mucosa; Nasal Swab  Result Value Ref Range Status   MRSA, PCR NEGATIVE NEGATIVE Final   Staphylococcus aureus NEGATIVE NEGATIVE Final    Comment: (NOTE) The Xpert SA Assay (FDA approved for NASAL specimens in patients 13 years of age and older), is one component of a comprehensive surveillance program. It is not intended to diagnose infection nor to guide or monitor treatment. Performed at [facility], [address], [place], [place] [address]  Radiology Studies: No results found.  Scheduled Meds:  aspirin  EC  81 mg Oral Daily   doxycycline   100 mg Oral Q12H   FLUoxetine   20 mg Oral QHS   gabapentin   100 mg Oral QHS   heparin   5,000 Units Subcutaneous Q8H   insulin  aspart  0-9 Units Subcutaneous TID WC   metoprolol  tartrate  25 mg Oral Daily   rosuvastatin   5 mg Oral QODAY   sodium chloride  flush  3 mL Intravenous Q12H   Continuous Infusions:  sodium chloride  100 mL/hr at 08/12/23 2205    ceFAZolin  (ANCEF ) IV 2 g (08/13/23 0605)     LOS: 6 days   35 minutes with more than 50% spent in reviewing records, counseling patient/family and coordinating care.  Chad VEAR Gaw, MD [facility] [email or web address] 08/13/2023, 3:03 PM

## 2023-08-14 ENCOUNTER — Encounter (HOSPITAL_COMMUNITY): Payer: Self-pay | Admitting: Orthopedic Surgery

## 2023-08-14 DIAGNOSIS — E1151 Type 2 diabetes mellitus with diabetic peripheral angiopathy without gangrene: Secondary | ICD-10-CM | POA: Diagnosis not present

## 2023-08-14 DIAGNOSIS — I10 Essential (primary) hypertension: Secondary | ICD-10-CM | POA: Diagnosis not present

## 2023-08-14 DIAGNOSIS — I70209 Unspecified atherosclerosis of native arteries of extremities, unspecified extremity: Secondary | ICD-10-CM | POA: Diagnosis not present

## 2023-08-14 DIAGNOSIS — M869 Osteomyelitis, unspecified: Secondary | ICD-10-CM | POA: Diagnosis not present

## 2023-08-14 LAB — CBC
HCT: 31.1 % — ABNORMAL LOW (ref 39.0–52.0)
Hemoglobin: 10.3 g/dL — ABNORMAL LOW (ref 13.0–17.0)
MCH: 29.3 pg (ref 26.0–34.0)
MCHC: 33.1 g/dL (ref 30.0–36.0)
MCV: 88.4 fL (ref 80.0–100.0)
Platelets: 132 K/uL — ABNORMAL LOW (ref 150–400)
RBC: 3.52 MIL/uL — ABNORMAL LOW (ref 4.22–5.81)
RDW: 13 % (ref 11.5–15.5)
WBC: 6.4 K/uL (ref 4.0–10.5)
nRBC: 0 % (ref 0.0–0.2)

## 2023-08-14 LAB — GLUCOSE, CAPILLARY
Glucose-Capillary: 139 mg/dL — ABNORMAL HIGH (ref 70–99)
Glucose-Capillary: 97 mg/dL (ref 70–99)
Glucose-Capillary: 168 mg/dL — ABNORMAL HIGH (ref 70–99)
Glucose-Capillary: 145 mg/dL — ABNORMAL HIGH (ref 70–99)

## 2023-08-14 LAB — BASIC METABOLIC PANEL WITH GFR
Anion gap: 7 (ref 5–15)
BUN: 10 mg/dL (ref 8–23)
CO2: 29 mmol/L (ref 22–32)
Calcium: 8.8 mg/dL — ABNORMAL LOW (ref 8.9–10.3)
Chloride: 102 mmol/L (ref 98–111)
Creatinine, Ser: 0.8 mg/dL (ref 0.61–1.24)
GFR, Estimated: >60 mL/min (ref >60)
Glucose, Bld: 146 mg/dL — ABNORMAL HIGH (ref 70–99)
Potassium: 4.1 mmol/L (ref 3.5–5.1)
Sodium: 138 mmol/L (ref 135–145)

## 2023-08-14 MED ORDER — CLOPIDOGREL BISULFATE 75 MG PO TABS
75.0000 mg | ORAL_TABLET | Freq: Every day | ORAL | Status: DC
  Administered 2023-08-14 – 2023-08-17 (×4): 75 mg via ORAL
  Filled 2023-08-14 (×4): qty 1

## 2023-08-14 NOTE — Plan of Care (Signed)

## 2023-08-14 NOTE — Progress Notes (Signed)
 Progress Note   Patient: Chad Grant FMW:[id number] DOB: May 09, 1950 DOA: 08/07/2023     7 DOS: the patient was seen and examined on 08/14/2023   Brief hospital course: 73 y.o. male, with past medical history of peripheral vascular disease status post left BKA 2010 in [place], 3rd and 4th right toe amputation 2014 by Dr. Serene, history of CVA, with residual left-sided weakness, vascular dementia, diabetes mellitus, hyperlipidemia presented for evaluation of progressive right fifth great toe wound over the last few weeks, but has significant discoloration over the last 2 weeks, and does not have the sensation of his feet. He usually ambulate slowly with walker and left leg prosthesis  Patient is admitted to TRH service for further management evaluation.  He is seen by orthopedic surgery team, performed amputation right foot fifth ray and revision of fourth ray amputation.  Patient is on wound VAC.  Assessment and Plan: Right fifth toe gangrene: Seen by vascular surgery and orthopedic surgery. S/p right foot fifth ray amputation, fourth ray revision. Patient will be continued on IV cefepime for 1 more day per orthopedic surgery. Wound VAC management as per orthopedic surgery. Drop in hemoglobin to 10.3 noted postsurgery from 12 7/22.  Continue to monitor H&H.  Transfuse for hemoglobin less than 7.5. He will need PT OT evaluation. Patient will benefit from skilled nursing facility placement.  History of CVA with residual left-sided weakness: Will resume Plavix  postsurgery. Continue statin.  Type II diabetes mellitus: A1c 6.2.  Accu-Cheks, sliding scale insulin  as per floor protocol. Hold glipizide.  Hypertension: Blood pressure stable.  Continue metoprolol  therapy.  Vascular dementia: Continue supportive care. Delirium precautions.     Out of bed to chair. Incentive spirometry. Nursing supportive care. Fall, aspiration precautions. Diet:  Diet Orders (From admission,  onward)     Start     Ordered   08/13/23 1512  Diet Carb Modified Fluid consistency: Thin; Room service appropriate? Yes  Diet effective now       Question Answer Comment  Diet-HS Snack? Nothing   Calorie Level Medium 1600-2000   Fluid consistency: Thin   Room service appropriate? Yes      08/13/23 1511           DVT prophylaxis: heparin  injection 5,000 Units Start: 08/08/23 2200  Level of care: Progressive Cardiac   Code Status: Limited: Do not attempt resuscitation (DNR) -DNR-LIMITED -Do Not Intubate/DNI   Subjective: Patient is seen and examined today morning. States he wishes to go home. Denies any pain. Discussed the need for SNF, vac therapy.  Physical Exam: Vitals:   08/13/23 2321 08/14/23 0332 08/14/23 0751 08/14/23 1011  BP: (!) 113/58 (!) 104/46 109/62 109/62  Pulse: (!) 59 60 60 60  Resp: 13 12 19    Temp: 97.8 F (36.6 C) 97.9 F (36.6 C) 98.1 F (36.7 C)   TempSrc: Oral Oral Oral   SpO2: 99% 97% 100%   Weight:      Height:        General - Elderly African-American male, no apparent distress HEENT - PERRLA, EOMI, atraumatic head, non tender sinuses. Lung - Clear, no rales, rhonchi, wheezes. Heart - S1, S2 heard, no murmurs, rubs, trace pedal edema. Abdomen - Soft, non tender, bowel sounds good Neuro - Alert, awake and oriented x 3, non focal exam. Skin - Warm and dry.  Left BKA, right foot wound VAC noted  Data Reviewed:      Latest Ref Rng & Units 08/14/2023    3:44  AM 08/13/2023    3:33 AM 08/12/2023    8:38 AM  CBC  WBC 4.0 - 10.5 K/uL 6.4  7.4  7.4   Hemoglobin 13.0 - 17.0 g/dL 89.6  88.3  87.9   Hematocrit 39.0 - 52.0 % 31.1  34.7  36.4   Platelets 150 - 400 K/uL 132  139  151       Latest Ref Rng & Units 08/14/2023    3:44 AM 08/13/2023    3:33 AM 08/12/2023    8:38 AM  BMP  Glucose 70 - 99 mg/dL 853  828  871   BUN 8 - 23 mg/dL 10  11  9    Creatinine 0.61 - 1.24 mg/dL 9.19  9.35  9.30   Sodium 135 - 145 mmol/L 138  139  135    Potassium 3.5 - 5.1 mmol/L 4.1  3.8  4.0   Chloride 98 - 111 mmol/L 102  104  99   CO2 22 - 32 mmol/L 29  27  28    Calcium  8.9 - 10.3 mg/dL 8.8  8.8  9.1    No results found.  Family Communication: Discussed with patient, understand and agree. All questions answered.  Disposition: Status is: Inpatient Remains inpatient appropriate because: pain control, PT/ OT, VAC therapy.  Planned Discharge Destination: Skilled nursing facility     Time spent: 42 minutes  Author: Concepcion Riser, MD 08/14/2023 11:06 AM Secure chat 7am to 7pm For on call review [email or web address].

## 2023-08-14 NOTE — Progress Notes (Signed)
 Patient ID: Chad Grant, male   DOB: 29-Jan-1950, 73 y.o.   MRN: [id number] Patient is postoperative day 1 right foot fifth ray amputation and revision of the fourth ray amputation.  There is 25 cc in the wound VAC canister with a good suction fit.  Anticipate discharge to skilled nursing facility.  Anticipate 1 week of negative pressure therapy.

## 2023-08-14 NOTE — Progress Notes (Signed)
 Physical Therapy Progress note Patient Details Name: Chad Grant MRN: [id number] DOB: 28-Jun-1950 Today's Date: 08/14/2023   History of Present Illness Chad Grant is a 73 y.o. male admitted 08/07/23 for osteomyelitis of the right fifth toe. S/p R 5th ray amputation on 7/23. PMHx: PVD, s/p L BKA 2010, s/p R 3rd and 4th toe amputation 2014, CVA with residual left-sided weakness, vascular dementia, and DM, HLD.    PT Comments  Pt is presenting below baseline level of functioning. Pt is requiring increased assist from previous sessions since surgery due to new WB precautions. Pt was Mod a for squat pivot transfer with significant difficulty maintaining WB precautions including Heel WB if absolutely necessary per MD pt wants to WB through R forefoot. Pt is Min A for bed mobility. If pt has 24/7 physical assist for cognition and transfers at home pt may return home. Due to pt current functional status, home set up and available assistance at home recommending skilled physical therapy services < 3 hours/day in order to address strength, balance and functional mobility to decrease risk for falls, injury, immobility, skin break down and re-hospitalization.      If plan is discharge home, recommend the following: Assist for transportation;Help with stairs or ramp for entrance;Supervision due to cognitive status;Two people to help with walking and/or transfers;Assistance with cooking/housework   Can travel by private vehicle     No  Equipment Recommendations  None recommended by PT;Other (comment) (pt requires new neoprene sleeve and socks for LLE prosthetic for fit and tear)       Precautions / Restrictions Precautions Precautions: Fall Recall of Precautions/Restrictions: Impaired Precaution/Restrictions Comments: bowel incontinence Restrictions Weight Bearing Restrictions Per Provider Order: Yes RLE Weight Bearing Per Provider Order: Non weight bearing Other Position/Activity Restrictions: or  heel WB if pt absolutely needs to [facility] for transfers only per Dr. Harden Note on 7/23     Mobility  Bed Mobility Overal bed mobility: Needs Assistance Bed Mobility: Supine to Sit     Supine to sit: HOB elevated, Used rails, Min assist     General bed mobility comments: verbal cues for technique    Transfers Overall transfer level: Needs assistance Equipment used: None Transfers: Bed to chair/wheelchair/BSC            Lateral/Scoot Transfers: Mod assist General transfer comment: Mod A with heavy cues for sequencing in order to avoid WB through the RLE with Mod A at the RLE to prevent WB through forefoot despite heavy multi modal cues for precautions    Ambulation/Gait     General Gait Details: Unable at this time     Balance Overall balance assessment: Needs assistance Sitting-balance support: No upper extremity supported, Feet supported Sitting balance-Leahy Scale: Fair Sitting balance - Comments: During functional lateral scoot      Communication Communication Communication: Impaired Factors Affecting Communication: Other (comment) (limited verbalizations)  Cognition Arousal: Alert Behavior During Therapy: Flat affect   PT - Cognitive impairments: Attention, Safety/Judgement       PT - Cognition Comments: Pt agreeable, limited verbalizations but following simple commands as able. Pt able to assist with donning LLE prosthetic but needs some assist to place the neoprene sleeve due to mitts Following commands: Impaired Following commands impaired: Follows one step commands with increased time, Follows multi-step commands with increased time    Cueing Cueing Techniques: Verbal cues, Gestural cues, Visual cues     General Comments General comments (skin integrity, edema, etc.): No signs/symptoms of cardiac/respiratory distress during  session      Pertinent Vitals/Pain Pain Assessment Pain Assessment: No/denies pain     PT Goals (current goals can now be  found in the care plan section) Acute Rehab PT Goals Patient Stated Goal: Return Home PT Goal Formulation: With patient Time For Goal Achievement: 08/28/23 Potential to Achieve Goals: Fair Progress towards PT goals: Goals updated    Frequency    Min 2X/week      PT Plan  Continue with current POC        AM-PAC PT 6 Clicks Mobility   Outcome Measure  Help needed turning from your back to your side while in a flat bed without using bedrails?: A Little Help needed moving from lying on your back to sitting on the side of a flat bed without using bedrails?: A Little Help needed moving to and from a bed to a chair (including a wheelchair)?: A Lot Help needed standing up from a chair using your arms (e.g., wheelchair or bedside chair)?: Total Help needed to walk in hospital room?: Total Help needed climbing 3-5 steps with a railing? : Total 6 Click Score: 11    End of Session Equipment Utilized During Treatment: Gait belt;Other (comment) (LLE prosthetic donned)   Patient left: with call bell/phone within reach;in chair;with chair alarm set Nurse Communication: Mobility status PT Visit Diagnosis: Difficulty in walking, not elsewhere classified (R26.2);Other abnormalities of gait and mobility (R26.89)     Time: 8741-8683 PT Time Calculation (min) (ACUTE ONLY): 18 min  Charges:    $Therapeutic Activity: 8-22 mins PT General Charges $$ ACUTE PT VISIT: 1 Visit                    Dorothyann Maier, DPT, CLT  Acute Rehabilitation Services Office: [phone] (Secure chat preferred)    Dorothyann VEAR Maier 08/14/2023, 1:25 PM

## 2023-08-15 DIAGNOSIS — M869 Osteomyelitis, unspecified: Secondary | ICD-10-CM | POA: Diagnosis not present

## 2023-08-15 DIAGNOSIS — E1151 Type 2 diabetes mellitus with diabetic peripheral angiopathy without gangrene: Secondary | ICD-10-CM | POA: Diagnosis not present

## 2023-08-15 DIAGNOSIS — I10 Essential (primary) hypertension: Secondary | ICD-10-CM | POA: Diagnosis not present

## 2023-08-15 DIAGNOSIS — I70209 Unspecified atherosclerosis of native arteries of extremities, unspecified extremity: Secondary | ICD-10-CM | POA: Diagnosis not present

## 2023-08-15 LAB — GLUCOSE, CAPILLARY
Glucose-Capillary: 126 mg/dL — ABNORMAL HIGH (ref 70–99)
Glucose-Capillary: 170 mg/dL — ABNORMAL HIGH (ref 70–99)
Glucose-Capillary: 99 mg/dL (ref 70–99)
Glucose-Capillary: 131 mg/dL — ABNORMAL HIGH (ref 70–99)

## 2023-08-15 LAB — CBC
HCT: 30.7 % — ABNORMAL LOW (ref 39.0–52.0)
Hemoglobin: 10 g/dL — ABNORMAL LOW (ref 13.0–17.0)
MCH: 29.1 pg (ref 26.0–34.0)
MCHC: 32.6 g/dL (ref 30.0–36.0)
MCV: 89.2 fL (ref 80.0–100.0)
Platelets: 125 K/uL — ABNORMAL LOW (ref 150–400)
RBC: 3.44 MIL/uL — ABNORMAL LOW (ref 4.22–5.81)
RDW: 12.8 % (ref 11.5–15.5)
WBC: 6.9 K/uL (ref 4.0–10.5)
nRBC: 0 % (ref 0.0–0.2)

## 2023-08-15 NOTE — [place] FL2 (Signed)
 [place]  MEDICAID [id number] LEVEL OF CARE FORM     IDENTIFICATION  Patient Name: Chad Grant [facility] Birthdate: 1950/05/23 Sex: male Admission Date (Current Location): 08/07/2023  [facility] and [place] Number:  Producer, television/film/video and Address:  [facility], [address] [place], [place] [address]      Provider Number: [id number]  Attending Physician Name and Address:  Darci Pore, MD  Relative Name and Phone Number:  Dontavius Keim (spouse) [phone]    Current Level of Care: Hospital Recommended Level of Care: Skilled Nursing Facility Prior Approval Number:    Date Approved/Denied:   PASRR Number: [id number]  Discharge Plan: SNF    Current Diagnoses: Patient Active Problem List   Diagnosis Date Noted   Osteomyelitis (neonatal) of jaw (suppurative) 08/07/2023   Osteomyelitis of fifth toe of right foot (HCC) 08/07/2023   Loss of weight 12/15/2018   Change in bowel habits 12/15/2018   PAD (peripheral artery disease) (HCC) 04/20/2012   Occlusion and stenosis of carotid artery without mention of cerebral infarction 04/20/2012   Depression 02/11/2012   Diabetes type 2 with atherosclerosis of arteries of extremities (HCC) 02/11/2012   HTN (hypertension) 03/19/2011    Orientation RESPIRATION BLADDER Height & Weight     Situation, Place  Normal Incontinent, External catheter (External Urinary Catheter) Weight: 151 lb 3.8 oz (68.6 kg) Height:  5' 9.5 (176.5 cm)  BEHAVIORAL SYMPTOMS/MOOD NEUROLOGICAL BOWEL NUTRITION STATUS      Incontinent Diet (Please see discharge summary)  AMBULATORY STATUS COMMUNICATION OF NEEDS Skin   Extensive Assist Verbally Other (Comment) (Wound/Incision LDAs,NP wound therapy,foot,anterior,R,Wound pretibial,L,proximal,Wound surgical,open Inc.,Foot,R)                       Personal Care Assistance Level of Assistance  Feeding, Bathing, Dressing Bathing Assistance: Maximum assistance Feeding assistance:  Limited assistance Dressing Assistance: Maximum assistance     Functional Limitations Info  Sight, Hearing, Speech   Hearing Info: Adequate Speech Info: Adequate    SPECIAL CARE FACTORS FREQUENCY  PT (By licensed PT), OT (By licensed OT)     PT Frequency: 5x min weekly OT Frequency: 5x min weekly            Contractures Contractures Info: Not present    Additional Factors Info  Code Status, Allergies, Psychotropic, Insulin  Sliding Scale Code Status Info: DNR Allergies Info: NKA Psychotropic Info: FLUoxetine  (PROZAC ) capsule 20 mg daily at bedtime,gabapentin  (NEURONTIN ) capsule 100 mg daily at bedtime Insulin  Sliding Scale Info: insulin  aspart (novoLOG ) injection 0-9 Units 3 times daily with meals       Current Medications (08/15/2023):  This is the current hospital active medication list Current Facility-Administered Medications  Medication Dose Route Frequency Provider Last Rate Last Admin   0.9 %  sodium chloride  infusion   Intravenous Continuous Pearline Norman RAMAN, MD 100 mL/hr at 08/15/23 0208 Infusion Verify at 08/15/23 0208   acetaminophen  (TYLENOL ) tablet 650 mg  650 mg Oral Q6H PRN Elgergawy, Dawood S, MD   650 mg at 08/13/23 1956   Or   acetaminophen  (TYLENOL ) suppository 650 mg  650 mg Rectal Q6H PRN Elgergawy, Dawood S, MD       albuterol  (PROVENTIL ) (2.5 MG/3ML) 0.083% nebulizer solution 2.5 mg  2.5 mg Nebulization Q2H PRN Elgergawy, Dawood S, MD       aspirin  EC tablet 81 mg  81 mg Oral Daily Pearline Norman RAMAN, MD   81 mg at 08/15/23 1043   clopidogrel  (PLAVIX )  tablet 75 mg  75 mg Oral Daily Sreeram, Narendranath, MD   75 mg at 08/15/23 1043   FLUoxetine  (PROZAC ) capsule 20 mg  20 mg Oral QHS Elgergawy, Dawood S, MD   20 mg at 08/14/23 2128   gabapentin  (NEURONTIN ) capsule 100 mg  100 mg Oral QHS Elgergawy, Dawood S, MD   100 mg at 08/14/23 2129   heparin  injection 5,000 Units  5,000 Units Subcutaneous Q8H Johnson, Clanford L, MD   5,000 Units at 08/15/23 9351    hydrALAZINE  (APRESOLINE ) injection 5 mg  5 mg Intravenous Q4H PRN Elgergawy, Dawood S, MD       insulin  aspart (novoLOG ) injection 0-9 Units  0-9 Units Subcutaneous TID WC Elgergawy, Dawood S, MD   1 Units at 08/15/23 0656   melatonin tablet 9 mg  9 mg Oral QHS PRN Elgergawy, Dawood S, MD   9 mg at 08/14/23 2129   metoprolol  tartrate (LOPRESSOR ) tablet 25 mg  25 mg Oral Daily Elgergawy, Dawood S, MD   25 mg at 08/15/23 1043   ondansetron  (ZOFRAN ) injection 4 mg  4 mg Intravenous Q6H PRN Rosario Eland I, MD   4 mg at 08/13/23 1346   pantoprazole  (PROTONIX ) EC tablet 40 mg  40 mg Oral Daily PRN Merrill, Kristin A, RPH       rosuvastatin  (CRESTOR ) tablet 5 mg  5 mg Oral QODAY Elgergawy, Dawood S, MD   5 mg at 08/15/23 1043   sodium chloride  flush (NS) 0.9 % injection 3 mL  3 mL Intravenous Q12H Pearline Norman RAMAN, MD   3 mL at 08/15/23 1046   sodium chloride  flush (NS) 0.9 % injection 3 mL  3 mL Intravenous PRN Pearline Norman RAMAN, MD         Discharge Medications: Please see discharge summary for a list of discharge medications.  Relevant Imaging Results:  Relevant Lab Results:   Additional Information [id number]  Isaiah Public, LCSWA

## 2023-08-15 NOTE — Progress Notes (Signed)
 Orthopedic Tech Progress Note Patient Details:  Chad Grant [facility] 04/09/1950 [id number]  Ortho Devices Type of Ortho Device: Postop shoe/boot Ortho Device/Splint Location: LLE/Left at bedside Ortho Device/Splint Interventions: Ordered   Post Interventions Patient Tolerated: Well  Chad Grant 08/15/2023, 2:13 PM

## 2023-08-15 NOTE — Anesthesia Postprocedure Evaluation (Signed)
 Anesthesia Post Note  Patient: Chad Grant  Procedure(s) Performed: AMPUTATION, FOOT, RAY (Right: Toe)     Patient location during evaluation: PACU Anesthesia Type: General Level of consciousness: patient cooperative Pain management: pain level controlled Vital Signs Assessment: post-procedure vital signs reviewed and stable Respiratory status: spontaneous breathing, nonlabored ventilation and respiratory function stable Cardiovascular status: blood pressure returned to baseline and stable Postop Assessment: no apparent nausea or vomiting Anesthetic complications: no   No notable events documented.                  Alyna Stensland

## 2023-08-15 NOTE — Plan of Care (Signed)
  Problem: Coping: Goal: Ability to adjust to condition or change in health will improve Outcome: Progressing   Problem: Skin Integrity: Goal: Risk for impaired skin integrity will decrease Outcome: Progressing   Problem: Tissue Perfusion: Goal: Adequacy of tissue perfusion will improve Outcome: Progressing   Problem: Education: Goal: Knowledge of General Education information will improve Description: Including pain rating scale, medication(s)/side effects and non-pharmacologic comfort measures Outcome: Progressing   Problem: Health Behavior/Discharge Planning: Goal: Ability to manage health-related needs will improve Outcome: Progressing   Problem: Activity: Goal: Risk for activity intolerance will decrease Outcome: Progressing   Problem: Coping: Goal: Level of anxiety will decrease Outcome: Progressing   Problem: Elimination: Goal: Will not experience complications related to bowel motility Outcome: Progressing Goal: Will not experience complications related to urinary retention Outcome: Progressing   Problem: Safety: Goal: Ability to remain free from injury will improve Outcome: Progressing   Problem: Activity: Goal: Ability to return to baseline activity level will improve Outcome: Progressing

## 2023-08-15 NOTE — TOC Progression Note (Signed)
 Transition of Care [facility]) - Progression Note    Patient Details  Name: Chad Grant MRN: [id number] Date of Birth: 01-06-1951  Transition of Care [facility]) CM/SW Contact  Isaiah Public, LCSWA Phone Number: 08/15/2023, 11:20 AM  Clinical Narrative:     CSW received consult for possible SNF placement at time of discharge. Due to patients current orientation CSW spoke with patients spouse regarding PT recommendation of SNF placement at time of discharge.  Patients spouse reports PTA patient comes from home with her. Patients spouse expressed understanding of PT recommendation and is agreeable to SNF placement for patient at time of discharge. Patients spouse gave CSW permission to fax out initial referral for possible SNF placement for patient. CSW discussed insurance authorization process. No further questions reported at this time. CSW to continue to follow and assist with discharge planning needs.          CSW discussed insurance authorization process and provided Medicare SNF ratings list. Patient has/has not received the COVID vaccines. Patient expressed being hopeful for rehab and to feel better soon. No further questions reported at this time. CSW to continue to follow and assist with discharge planning needs.   Expected Discharge Plan: Skilled Nursing Facility Barriers to Discharge: Continued Medical Work up               Expected Discharge Plan and Services In-house Referral: Clinical Social Work Discharge Planning Services: CM Consult Post Acute Care Choice: Home Health Living arrangements for the past 2 months: Single Family Home                   DME Agency: NA       HH Arranged: PT, OT HH Agency: [facility] Date HH Agency Contacted: 08/12/23 Time HH Agency Contacted: 1118 Representative spoke with at [facility] Agency: Amy Hyatt   Social Drivers of Health (SDOH) Interventions SDOH Screenings   Food Insecurity: No Food Insecurity (08/08/2023)  Housing:  Low Risk  (08/08/2023)  Transportation Needs: No Transportation Needs (08/08/2023)  Utilities: Not At Risk (08/08/2023)  Depression (PHQ2-9): Low Risk  (03/28/2021)  Financial Resource Strain: Low Risk  (04/02/2021)   Received from [facility]  Social Connections: Socially Isolated (08/08/2023)  Tobacco Use: Low Risk  (08/13/2023)    Readmission Risk Interventions     No data to display

## 2023-08-15 NOTE — Progress Notes (Signed)
 Progress Note   Patient: Chad Grant FMW:[id number] DOB: 1950/02/09 DOA: 08/07/2023     8 DOS: the patient was seen and examined on 08/15/2023   Brief hospital course: 73 y.o. male, with past medical history of peripheral vascular disease status post left BKA 2010 in [place], 3rd and 4th right toe amputation 2014 by Dr. Serene, history of CVA, with residual left-sided weakness, vascular dementia, diabetes mellitus, hyperlipidemia presented for evaluation of progressive right fifth great toe wound over the last few weeks, but has significant discoloration over the last 2 weeks, and does not have the sensation of his feet. He usually ambulate slowly with walker and left leg prosthesis  Patient is admitted to TRH service for further management evaluation.  He is seen by orthopedic surgery team, performed amputation right foot fifth ray and revision of fourth ray amputation.  Patient is on wound VAC.  Assessment and Plan: Right fifth toe gangrene: Seen by vascular surgery and orthopedic surgery. S/p right foot fifth ray amputation, fourth ray revision. Continue pain control. Wound VAC management as per orthopedic surgery. Drop in hemoglobin to 10 remained stable postsurgery from 12 on 7/22.  Continue to monitor H&H.  Transfuse for hemoglobin less than 7.5. PT/ OT advised SNF Placement. Post op shoe at bedside. TOC working on placement.  History of CVA with residual left-sided weakness: Resumed Plavix  postsurgery. Continue statin.  Type II diabetes mellitus: A1c 6.2.  Accu-Cheks, sliding scale insulin  as per floor protocol. Hold glipizide.  Hypertension: Blood pressure stable.  Continue metoprolol  therapy.  Vascular dementia: Continue supportive care. Delirium precautions.     Out of bed to chair. Incentive spirometry. Nursing supportive care. Fall, aspiration precautions. Diet:  Diet Orders (From admission, onward)     Start     Ordered   08/15/23 0750  Diet Carb  Modified Fluid consistency: Thin; Room service appropriate? No  Diet effective now       Question Answer Comment  Diet-HS Snack? Nothing   Calorie Level Medium 1600-2000   Fluid consistency: Thin   Room service appropriate? No      08/15/23 0749           DVT prophylaxis: heparin  injection 5,000 Units Start: 08/08/23 2200  Level of care: Progressive Cardiac   Code Status: Limited: Do not attempt resuscitation (DNR) -DNR-LIMITED -Do Not Intubate/DNI   Subjective: Patient is seen and examined today morning. He worked with PT, sitting in chair. Denies any pain. Understand he need SNF, vac therapy.  Physical Exam: Vitals:   08/15/23 0322 08/15/23 0923 08/15/23 1125 08/15/23 1640  BP: (!) 143/65 128/73 (!) 128/103 127/62  Pulse: 62 (!) 52    Resp: 20 15 14  (!) 21  Temp: 97.9 F (36.6 C) 97.8 F (36.6 C) (!) 97.4 F (36.3 C) 99.2 F (37.3 C)  TempSrc: Oral Oral Oral Oral  SpO2: 99% 99% 100% 100%  Weight:      Height:        General - Elderly African-American male, no apparent distress HEENT - PERRLA, EOMI, atraumatic head, non tender sinuses. Lung - Clear, no rales, rhonchi, wheezes. Heart - S1, S2 heard, no murmurs, rubs, trace pedal edema. Abdomen - Soft, non tender, bowel sounds good Neuro - Alert, awake and oriented x 3, non focal exam. Skin - Warm and dry.  Left BKA, right foot wound VAC noted  Data Reviewed:      Latest Ref Rng & Units 08/15/2023    3:32 AM 08/14/2023  3:44 AM 08/13/2023    3:33 AM  CBC  WBC 4.0 - 10.5 K/uL 6.9  6.4  7.4   Hemoglobin 13.0 - 17.0 g/dL 89.9  89.6  88.3   Hematocrit 39.0 - 52.0 % 30.7  31.1  34.7   Platelets 150 - 400 K/uL 125  132  139       Latest Ref Rng & Units 08/14/2023    3:44 AM 08/13/2023    3:33 AM 08/12/2023    8:38 AM  BMP  Glucose 70 - 99 mg/dL 853  828  871   BUN 8 - 23 mg/dL 10  11  9    Creatinine 0.61 - 1.24 mg/dL 9.19  9.35  9.30   Sodium 135 - 145 mmol/L 138  139  135   Potassium 3.5 - 5.1 mmol/L 4.1   3.8  4.0   Chloride 98 - 111 mmol/L 102  104  99   CO2 22 - 32 mmol/L 29  27  28    Calcium  8.9 - 10.3 mg/dL 8.8  8.8  9.1    No results found.  Family Communication: Discussed with patient, he understand and agree. All questions answered.  Disposition: Status is: Inpatient Remains inpatient appropriate because: pain control, PT/ OT, VAC therapy, SNF placement  Planned Discharge Destination: Skilled nursing facility     Time spent: 40 minutes  Author: Concepcion Riser, MD 08/15/2023 5:14 PM Secure chat 7am to 7pm For on call review [email or web address].

## 2023-08-15 NOTE — Progress Notes (Signed)
 Occupational Therapy Treatment Patient Details Name: Chad Grant MRN: [id number] DOB: [birth year] Today's Date: 08/15/2023   History of present illness Chad Grant is a 73 y.o. male admitted 08/07/23 for osteomyelitis of the right fifth toe. S/p R 5th ray amputation on 7/23. PMHx: PVD, s/p L BKA 2010, s/p R 3rd and 4th toe amputation 2014, CVA with residual left-sided weakness, vascular dementia, and DM, HLD.   OT comments  Pt progressing towards goals. Today's session focused on functional mobility s/p amputation of R 5th ray amputation. Pt requiring only min +2 assist for transfer. +2 for safety, pt with poor recall and implementation of WB precautions, requiring frequent cues. Pt continues to be limited by decreased strength, balance and activity tolerance. Updated d/c recs to <3 hours of skilled rehab daily to optimize independence levels. Will continue to follow acutely.       If plan is discharge home, recommend the following:  Two people to help with walking and/or transfers;A lot of help with bathing/dressing/bathroom;Assistance with cooking/housework;Direct supervision/assist for medications management;Direct supervision/assist for financial management;Assist for transportation;Help with stairs or ramp for entrance   Equipment Recommendations  Other (comment) (Defer to next venue)       Precautions / Restrictions Precautions Precautions: Fall Recall of Precautions/Restrictions: Impaired Precaution/Restrictions Comments: bowel incontinence Restrictions Weight Bearing Restrictions Per Provider Order: Yes RLE Weight Bearing Per Provider Order: Non weight bearing Other Position/Activity Restrictions: or heel WB if pt absolutely needs to [facility] for transfers only per Dr. Harden Note on 7/23       Mobility Bed Mobility Overal bed mobility: Needs Assistance Bed Mobility: Supine to Sit     Supine to sit: HOB elevated, Used rails, Min assist     General bed mobility comments: Min  HH assist, frequent cueing to maintain NWB status    Transfers Overall transfer level: Needs assistance Equipment used: None Transfers: Bed to chair/wheelchair/BSC            Lateral/Scoot Transfers: Min assist, +2 safety/equipment, +2 physical assistance General transfer comment: min +2 for safety to maintain NWB status, heavy multimodal cues     Balance Overall balance assessment: Needs assistance Sitting-balance support: No upper extremity supported, Feet supported Sitting balance-Leahy Scale: Fair     Standing balance support: Bilateral upper extremity supported, During functional activity, Reliant on assistive device for balance Standing balance-Leahy Scale: Poor Standing balance comment: reliant on RW for support       ADL either performed or assessed with clinical judgement   ADL Overall ADL's : Needs assistance/impaired     Toilet Transfer: Minimal assistance;+2 for physical assistance;+2 for safety/equipment Toilet Transfer Details (indicate cue type and reason): With lateral scoots         Functional mobility during ADLs: Minimal assistance;+2 for physical assistance;+2 for safety/equipment      Extremity/Trunk Assessment Upper Extremity Assessment Upper Extremity Assessment: Generalized weakness;Right hand dominant   Lower Extremity Assessment Lower Extremity Assessment: Defer to PT evaluation        Vision   Vision Assessment?: No apparent visual deficits         Communication Communication Communication: Impaired Factors Affecting Communication: Difficulty expressing self   Cognition Arousal: Alert Behavior During Therapy: Flat affect Cognition: History of cognitive impairments     OT - Cognition Comments: Improved situational awareness   Following commands: Impaired Following commands impaired: Follows one step commands with increased time, Follows one step commands inconsistently      Cueing   Cueing Techniques: Verbal cues,  Gestural cues, Visual cues        General Comments VSS on RA    Pertinent Vitals/ Pain       Pain Assessment Pain Assessment: No/denies pain   Frequency  Min 2X/week        Progress Toward Goals  OT Goals(current goals can now be found in the care plan section)  Progress towards OT goals: Progressing toward goals  Acute Rehab OT Goals Patient Stated Goal: None stated OT Goal Formulation: With patient Time For Goal Achievement: 08/23/23 Potential to Achieve Goals: Good ADL Goals Pt Will Perform Lower Body Bathing: with min assist;with mod assist;sitting/lateral leans;sit to/from stand Pt Will Perform Upper Body Dressing: with set-up;sitting Pt Will Perform Lower Body Dressing: with min assist;with mod assist;sitting/lateral leans;sit to/from stand Pt Will Perform Tub/Shower Transfer: Tub transfer;with contact guard assist;ambulating;shower seat;grab bars Pt/caregiver will Perform Home Exercise Program: Increased strength;Both right and left upper extremity;With minimal assist;With written HEP provided  Plan         AM-PAC OT 6 Clicks Daily Activity     Outcome Measure   Help from another person eating meals?: None Help from another person taking care of personal grooming?: A Little Help from another person toileting, which includes using toliet, bedpan, or urinal?: Total Help from another person bathing (including washing, rinsing, drying)?: A Lot Help from another person to put on and taking off regular upper body clothing?: A Little Help from another person to put on and taking off regular lower body clothing?: A Lot 6 Click Score: 15    End of Session Equipment Utilized During Treatment: Gait belt  OT Visit Diagnosis: Unsteadiness on feet (R26.81);Other abnormalities of gait and mobility (R26.89);Muscle weakness (generalized) (M62.81)   Activity Tolerance Patient tolerated treatment well   Patient Left in chair;with call bell/phone within reach;with chair  alarm set   Nurse Communication Mobility status        Time: 8871-8855 OT Time Calculation (min): 16 min  Charges: OT General Charges $OT Visit: 1 Visit OT Evaluation $OT Re-eval: 1 Re-eval  Adrianne BROCKS, OT  Acute Rehabilitation Services Office [phone] Secure chat preferred   Adrianne GORMAN Savers 08/15/2023, 1:07 PM

## 2023-08-16 DIAGNOSIS — M869 Osteomyelitis, unspecified: Secondary | ICD-10-CM | POA: Diagnosis not present

## 2023-08-16 LAB — GLUCOSE, CAPILLARY
Glucose-Capillary: 135 mg/dL — ABNORMAL HIGH (ref 70–99)
Glucose-Capillary: 137 mg/dL — ABNORMAL HIGH (ref 70–99)
Glucose-Capillary: 141 mg/dL — ABNORMAL HIGH (ref 70–99)
Glucose-Capillary: 157 mg/dL — ABNORMAL HIGH (ref 70–99)
Glucose-Capillary: 122 mg/dL — ABNORMAL HIGH (ref 70–99)

## 2023-08-16 LAB — CBC
HCT: 28.8 % — ABNORMAL LOW (ref 39.0–52.0)
Hemoglobin: 9.7 g/dL — ABNORMAL LOW (ref 13.0–17.0)
MCH: 29.8 pg (ref 26.0–34.0)
MCHC: 33.7 g/dL (ref 30.0–36.0)
MCV: 88.3 fL (ref 80.0–100.0)
Platelets: 116 K/uL — ABNORMAL LOW (ref 150–400)
RBC: 3.26 MIL/uL — ABNORMAL LOW (ref 4.22–5.81)
RDW: 12.9 % (ref 11.5–15.5)
WBC: 6.4 K/uL (ref 4.0–10.5)
nRBC: 0 % (ref 0.0–0.2)

## 2023-08-16 NOTE — Progress Notes (Signed)
 Mobility Specialist Progress Note:    08/16/23 1050  Mobility  Activity Transferred from bed to chair  Level of Assistance Moderate assist, patient does 50-74%  Assistive Device  (bed pad)  RLE Weight Bearing Per Provider Order NWB  Activity Response Tolerated well  Mobility Referral Yes  Mobility visit 1 Mobility  Mobility Specialist Start Time (ACUTE ONLY) 1050  Mobility Specialist Stop Time (ACUTE ONLY) 1058  Mobility Specialist Time Calculation (min) (ACUTE ONLY) 8 min   Pt received in bed, agreeable to mobility session. ModA required to assist in scooting B>C. Tolerated well, difficult to adhere to NWB precautions for RLE. Sitting in chair with alarm on, call bell in reach, all needs met.   Kimesha Claxton Mobility Specialist Please contact via Special educational needs teacher or  Rehab office at [phone]

## 2023-08-16 NOTE — Plan of Care (Signed)
  Problem: Coping: Goal: Ability to adjust to condition or change in health will improve Outcome: Progressing   Problem: Nutritional: Goal: Maintenance of adequate nutrition will improve Outcome: Progressing   Problem: Clinical Measurements: Goal: Respiratory complications will improve Outcome: Progressing

## 2023-08-16 NOTE — TOC Progression Note (Signed)
 Transition of Care [facility]) - Progression Note    Patient Details  Name: [name] MRN: [id number] Date of Birth: 04/15/1950  [facility]) CM/SW Contact  Olam FORBES Ally, LCSW Phone Number: 08/16/2023, 3:52 PM  Clinical Narrative:     CSW received a return call from pt's wife [name] and she informed CSW that she would like to select [facility]. CSW spoke with Isaiah at [facility] and she informed CSW that she could accept patient tomorrow if authorization came back.  CSW called [facility] and spoke with Tammy and pt's auth was starting. CSW informed MD of DC updates.  TOC team will continue to assist with discharge planning needs.   Olam Ally, MSW, LCSW [place]  [facility], [facility] Licensed Clinical Social Worker Direct Dial: [phone]    Expected Discharge Plan: Skilled Nursing Facility Barriers to Discharge: Continued Medical Work up               Expected Discharge Plan and Services In-house Referral: Clinical Social Work Discharge Planning Services: CM Consult Post Acute Care Choice: Home Health Living arrangements for the past 2 months: Single Family Home                   DME Agency: NA       HH Arranged: PT, OT HH Agency: [facility] Date [facility] Agency Contacted: 08/12/23 Time HH Agency Contacted: 1118 Representative spoke with at [facility] Agency: Amy Hyatt   Social Drivers of Health (SDOH) Interventions SDOH Screenings   Food Insecurity: No Food Insecurity (08/08/2023)  Housing: Low Risk  (08/08/2023)  Transportation Needs: No Transportation Needs (08/08/2023)  Utilities: Not At Risk (08/08/2023)  Depression (PHQ2-9): Low Risk  (03/28/2021)  Financial Resource Strain: Low Risk  (04/02/2021)   Received from [facility]  Social Connections: Socially Isolated (08/08/2023)  Tobacco Use: Low Risk  (08/13/2023)    Readmission Risk Interventions     No data to display

## 2023-08-16 NOTE — Plan of Care (Signed)
  Problem: Fluid Volume: Goal: Ability to maintain a balanced intake and output will improve Outcome: Progressing   Problem: Metabolic: Goal: Ability to maintain appropriate glucose levels will improve Outcome: Progressing   Problem: Nutritional: Goal: Maintenance of adequate nutrition will improve Outcome: Progressing   Problem: Tissue Perfusion: Goal: Adequacy of tissue perfusion will improve Outcome: Progressing   Problem: Education: Goal: Knowledge of General Education information will improve Description: Including pain rating scale, medication(s)/side effects and non-pharmacologic comfort measures Outcome: Progressing   Problem: Clinical Measurements: Goal: Ability to maintain clinical measurements within normal limits will improve Outcome: Progressing Goal: Will remain free from infection Outcome: Progressing Goal: Diagnostic test results will improve Outcome: Progressing Goal: Respiratory complications will improve Outcome: Progressing Goal: Cardiovascular complication will be avoided Outcome: Progressing   Problem: Activity: Goal: Risk for activity intolerance will decrease Outcome: Progressing   Problem: Nutrition: Goal: Adequate nutrition will be maintained Outcome: Progressing   Problem: Coping: Goal: Level of anxiety will decrease Outcome: Progressing   Problem: Elimination: Goal: Will not experience complications related to bowel motility Outcome: Progressing Goal: Will not experience complications related to urinary retention Outcome: Progressing   Problem: Pain Managment: Goal: General experience of comfort will improve and/or be controlled Outcome: Progressing   Problem: Safety: Goal: Ability to remain free from injury will improve Outcome: Progressing   Problem: Skin Integrity: Goal: Risk for impaired skin integrity will decrease Outcome: Progressing   Problem: Cardiovascular: Goal: Ability to achieve and maintain adequate  cardiovascular perfusion will improve Outcome: Progressing Goal: Vascular access site(s) Level 0-1 will be maintained Outcome: Progressing

## 2023-08-16 NOTE — Progress Notes (Addendum)
 Progress Note    Chad Grant  FMW:[id number] DOB: Apr 16, 1950  DOA: 08/07/2023 PCP: Maree Isles, MD      Brief Narrative:    Medical records reviewed and are as summarized below:  Chad Grant is a 73 y.o. male with past medical history of peripheral vascular disease status post left BKA 2010 in [place], 3rd and 4th right toe amputation 2014 by Dr. Serene, history of CVA, with residual left-sided weakness, vascular dementia, diabetes mellitus, hyperlipidemia presented for evaluation of progressive right fifth great toe wound over the last few weeks prior to admission.  He noticed significant discoloration and loss of sensation in this in the 2 weeks preceding admission.  He usually ambulates slowly with walker and left leg prosthesis at baseline.  He was admitted to the hospital for right fifth toe gangrene.  He underwent right 4th and 5th ray amputation on 08/13/2023.      Assessment/Plan:   Principal Problem:   Osteomyelitis of fifth toe of right foot (HCC) Active Problems:   HTN (hypertension)   Diabetes type 2 with atherosclerosis of arteries of extremities (HCC)   PAD (peripheral artery disease) (HCC)    Body mass index is 22.01 kg/m.   Right fifth toe gangrene: Seen by vascular surgery and orthopedic surgery. S/p right foot fifth ray amputation, fourth ray revision. Wound VAC management as per orthopedic surgery. Analgesics as needed for pain. Follow-up with orthopedic surgeon. Postop shoe at the bedside. PT and OT recommended discharge to SNF.  Vascular surgeon also performed third order cannulation of right peroneal artery. Intravascular lithotripsy of the right peroneal and TP trunk, 4 mm E8 shockwave on 08/11/2023   Acute blood loss anemia: Hemoglobin down from 12-9.7.  No indication for blood transfusion at this time.    History of CVA with residual left-sided weakness: Continue Plavix  and statin    Type II diabetes  mellitus: NovoLog  as needed for hyperglycemia. A1c 6.2.   Hold glipizide.    Morbidities include hypertension, vascular dementia hypertension:          Diet Order             Diet Carb Modified Fluid consistency: Thin; Room service appropriate? No  Diet effective now                            Consultants: Orthopedic surgery Vascular surgeon  Procedures: Right 4th and 5th ray amputation on 08/13/2023 Third order cannulation of right peroneal artery Intravascular lithotripsy of the right peroneal and TP trunk, 4 mm E8 shockwave by vascular surgeon on 08/11/2023    Medications:    aspirin  EC  81 mg Oral Daily   clopidogrel   75 mg Oral Daily   FLUoxetine   20 mg Oral QHS   gabapentin   100 mg Oral QHS   heparin   5,000 Units Subcutaneous Q8H   insulin  aspart  0-9 Units Subcutaneous TID WC   metoprolol  tartrate  25 mg Oral Daily   rosuvastatin   5 mg Oral QODAY   sodium chloride  flush  3 mL Intravenous Q12H   Continuous Infusions:  sodium chloride  100 mL/hr at 08/16/23 0757     Anti-infectives (From admission, onward)    Start     Dose/Rate Route Frequency Ordered Stop   08/13/23 1000  doxycycline  (VIBRA -TABS) tablet 100 mg        100 mg Oral Every 12 hours 08/13/23 0758 08/14/23 1730   08/13/23 0600  ceFAZolin  (ANCEF ) IVPB 2g/100 mL premix        2 g 200 mL/hr over 30 Minutes Intravenous On call to O.R. 08/12/23 1911 08/13/23 1405   08/08/23 1000  doxycycline  (VIBRA -TABS) tablet 100 mg  Status:  Discontinued        100 mg Oral Every 12 hours 08/07/23 1727 08/13/23 0757   08/07/23 2200  ceFAZolin  (ANCEF ) IVPB 2g/100 mL premix        2 g 200 mL/hr over 30 Minutes Intravenous Every 8 hours 08/07/23 1729 08/14/23 1730   08/07/23 1515  vancomycin  (VANCOCIN ) IVPB 1000 mg/200 mL premix        1,000 mg 200 mL/hr over 60 Minutes Intravenous  Once 08/07/23 1508 08/07/23 1715              Family Communication/Anticipated D/C date and plan/Code  Status   DVT prophylaxis: heparin  injection 5,000 Units Start: 08/08/23 2200     Code Status: Limited: Do not attempt resuscitation (DNR) -DNR-LIMITED -Do Not Intubate/DNI   Family Communication: None Disposition Plan: Plan to discharge to SNF   Status is: Inpatient Remains inpatient appropriate because: Awaiting placement to SNF       Subjective:   Interval events noted.  No complaints.  Objective:    Vitals:   08/15/23 2026 08/15/23 2328 08/16/23 0344 08/16/23 0824  BP: (!) 122/57 113/69 (!) 115/56 118/61  Pulse: 60 61 61 (!) 54  Resp: 20 17 12    Temp: 98.4 F (36.9 C) 98 F (36.7 C) 98 F (36.7 C) 97.7 F (36.5 C)  TempSrc: Oral Oral Oral Oral  SpO2: 100% 100% 100%   Weight:      Height:       No data found.   Intake/Output Summary (Last 24 hours) at 08/16/2023 1151 Last data filed at 08/16/2023 0500 Gross per 24 hour  Intake 2234.79 ml  Output 1700 ml  Net 534.79 ml   Filed Weights   08/07/23 1337 08/13/23 1237  Weight: 68.6 kg 68.6 kg    Exam:  GEN: NAD, sitting up in the chair SKIN: Warm and dry EYES: No pallor or icterus ENT: MMM CV: RRR PULM: CTA B ABD: soft, ND, NT, +BS CNS: AAO x 2 (person and place) EXT: Left BKA, right foot wound with wound vac in place        Data Reviewed:   I have personally reviewed following labs and imaging studies:  Labs: Labs show the following:   Basic Metabolic Panel: Recent Labs  Lab 08/12/23 0838 08/13/23 0333 08/14/23 0344  NA 135 139 138  K 4.0 3.8 4.1  CL 99 104 102  CO2 28 27 29   GLUCOSE 128* 171* 146*  BUN 9 11 10   CREATININE 0.69 0.64 0.80  CALCIUM  9.1 8.8* 8.8*   GFR Estimated Creatinine Clearance: 78.6 mL/min (by C-G formula based on SCr of 0.8 mg/dL). Liver Function Tests: Recent Labs  Lab 08/12/23 0838  AST 19  ALT 7  ALKPHOS 81  BILITOT 1.0  PROT 6.6  ALBUMIN 3.2*   No results for input(s): LIPASE, AMYLASE in the last 168 hours. No results for input(s):  AMMONIA in the last 168 hours. Coagulation profile No results for input(s): INR, PROTIME in the last 168 hours.  CBC: Recent Labs  Lab 08/12/23 0838 08/13/23 0333 08/14/23 0344 08/15/23 0332 08/16/23 0209  WBC 7.4 7.4 6.4 6.9 6.4  HGB 12.0* 11.6* 10.3* 10.0* 9.7*  HCT 36.4* 34.7* 31.1* 30.7* 28.8*  MCV 87.3 88.1  88.4 89.2 88.3  PLT 151 139* 132* 125* 116*   Cardiac Enzymes: No results for input(s): CKTOTAL, CKMB, CKMBINDEX, TROPONINI in the last 168 hours. BNP (last 3 results) No results for input(s): PROBNP in the last 8760 hours. CBG: Recent Labs  Lab 08/15/23 1123 08/15/23 1639 08/15/23 2123 08/16/23 0527 08/16/23 0615  GLUCAP 170* 99 131* 135* 137*   D-Dimer: No results for input(s): DDIMER in the last 72 hours. Hgb A1c: No results for input(s): HGBA1C in the last 72 hours. Lipid Profile: No results for input(s): CHOL, HDL, LDLCALC, TRIG, CHOLHDL, LDLDIRECT in the last 72 hours. Thyroid  function studies: No results for input(s): TSH, T4TOTAL, T3FREE, THYROIDAB in the last 72 hours.  Invalid input(s): FREET3 Anemia work up: No results for input(s): VITAMINB12, FOLATE, FERRITIN, TIBC, IRON, RETICCTPCT in the last 72 hours. Sepsis Labs: Recent Labs  Lab 08/13/23 0333 08/14/23 0344 08/15/23 0332 08/16/23 0209  WBC 7.4 6.4 6.9 6.4    Microbiology Recent Results (from the past 240 hours)  Blood culture (routine x 2)     Status: None   Collection Time: 08/07/23  3:35 PM   Specimen: BLOOD LEFT FOREARM  Result Value Ref Range Status   Specimen Description BLOOD LEFT FOREARM  Final   Special Requests   Final    BOTTLES DRAWN AEROBIC AND ANAEROBIC Blood Culture adequate volume   Culture   Final    NO GROWTH 5 DAYS Performed at [facility], [address], [place], [place] [address]    Report Status 08/12/2023 FINAL  Final  Blood culture (routine x 2)     Status: None   Collection Time: 08/07/23  3:40  PM   Specimen: BLOOD LEFT ARM  Result Value Ref Range Status   Specimen Description BLOOD LEFT ARM  Final   Special Requests   Final    BOTTLES DRAWN AEROBIC AND ANAEROBIC Blood Culture results may not be optimal due to an inadequate volume of blood received in culture bottles   Culture   Final    NO GROWTH 5 DAYS Performed at [facility], [address], [place], [place] [address]    Report Status 08/12/2023 FINAL  Final  Surgical pcr screen     Status: None   Collection Time: 08/10/23  5:39 PM   Specimen: Nasal Mucosa; Nasal Swab  Result Value Ref Range Status   MRSA, PCR NEGATIVE NEGATIVE Final   Staphylococcus aureus NEGATIVE NEGATIVE Final    Comment: (NOTE) The Xpert SA Assay (FDA approved for NASAL specimens in patients 65 years of age and older), is one component of a comprehensive surveillance program. It is not intended to diagnose infection nor to guide or monitor treatment. Performed at [facility], [address], [place], [place] [address]     Procedures and diagnostic studies:  No results found.             LOS: 9 days   Kearston Putman  [employer] Chartered loss adjuster on [email or web address]. If 7PM-7AM, please contact night-coverage at [email or web address]     08/16/2023, 11:51 AM

## 2023-08-16 NOTE — Progress Notes (Signed)
 Mobility Specialist Progress Note:    08/16/23 1229  Mobility  Activity Transferred from chair to bed  Level of Assistance Minimal assist, patient does 75% or more (+2)  Assistive Device  (BED PADS)  RLE Weight Bearing Per Provider Order NWB  Activity Response Tolerated well  Mobility Referral Yes  Mobility visit 1 Mobility  Mobility Specialist Start Time (ACUTE ONLY) 1223  Mobility Specialist Stop Time (ACUTE ONLY) 1229  Mobility Specialist Time Calculation (min) (ACUTE ONLY) 6 min   Pt received in chair, assisted NT with return to bed. MinA+2 to scoot with assistance of chuck pads. Tolerated well, pt incontinent of stool. NT in room, assisting with bath. Left with all needs met.    Chad Grant Mobility Specialist Please contact via Special educational needs teacher or  Rehab office at [phone]

## 2023-08-16 NOTE — Progress Notes (Signed)
 Patient ID: Chad Grant, male   DOB: 08/17/1950, 73 y.o.   MRN: [id number] Patient is seen in follow-up status post 4th and 5th ray amputations right foot.  The wound VAC is functioning well there is 100 cc out of the wound VAC dressing.  Anticipate discharge to skilled nursing.  Orders placed to discontinue the wound VAC at discharge.

## 2023-08-16 NOTE — Plan of Care (Signed)
  Problem: Coping: Goal: Ability to adjust to condition or change in health will improve Outcome: Progressing   Problem: Fluid Volume: Goal: Ability to maintain a balanced intake and output will improve Outcome: Progressing   Problem: Metabolic: Goal: Ability to maintain appropriate glucose levels will improve Outcome: Progressing   Problem: Nutritional: Goal: Maintenance of adequate nutrition will improve Outcome: Progressing   Problem: Skin Integrity: Goal: Risk for impaired skin integrity will decrease Outcome: Progressing   Problem: Tissue Perfusion: Goal: Adequacy of tissue perfusion will improve Outcome: Progressing   Problem: Education: Goal: Knowledge of General Education information will improve Description: Including pain rating scale, medication(s)/side effects and non-pharmacologic comfort measures Outcome: Progressing   Problem: Clinical Measurements: Goal: Will remain free from infection Outcome: Progressing Goal: Diagnostic test results will improve Outcome: Progressing

## 2023-08-16 NOTE — TOC Progression Note (Signed)
 Transition of Care [facility]) - Progression Note    Patient Details  Name: Chad Grant Treat MRN: [id number] Date of Birth: 1950/05/29  Transition of Care [facility]) CM/SW Contact  Olam FORBES Ally, LCSW Phone Number: 08/16/2023, 3:03 PM  Clinical Narrative:     CSW spoke with pt's wife( [name]) about pt's bed offers. [name] explained that she would prefer that pt be placed near [place]. CSW explained that [facility] has declined an offer and [facility] has extended an offer. Sabrina wants CSW to call her tomorrow after she has a chance to review the medicare.gov website that was provided by CSW. CSW agreed to call [name] back tomorrow.  TOC team will continue to assist with discharge planning needs.   Expected Discharge Plan: Skilled Nursing Facility Barriers to Discharge: Continued Medical Work up               Expected Discharge Plan and Services In-house Referral: Clinical Social Work Discharge Planning Services: CM Consult Post Acute Care Choice: Home Health Living arrangements for the past 2 months: Single Family Home                   DME Agency: NA       HH Arranged: PT, OT HH Agency: [facility] Date HH Agency Contacted: 08/12/23 Time HH Agency Contacted: 1118 Representative spoke with at [facility] Agency: Amy Hyatt   Social Drivers of Health (SDOH) Interventions SDOH Screenings   Food Insecurity: No Food Insecurity (08/08/2023)  Housing: Low Risk  (08/08/2023)  Transportation Needs: No Transportation Needs (08/08/2023)  Utilities: Not At Risk (08/08/2023)  Depression (PHQ2-9): Low Risk  (03/28/2021)  Financial Resource Strain: Low Risk  (04/02/2021)   Received from [facility]  Social Connections: Socially Isolated (08/08/2023)  Tobacco Use: Low Risk  (08/13/2023)    Readmission Risk Interventions     No data to display

## 2023-08-17 DIAGNOSIS — Z7401 Bed confinement status: Secondary | ICD-10-CM | POA: Diagnosis not present

## 2023-08-17 DIAGNOSIS — F339 Major depressive disorder, recurrent, unspecified: Secondary | ICD-10-CM | POA: Diagnosis not present

## 2023-08-17 DIAGNOSIS — R2689 Other abnormalities of gait and mobility: Secondary | ICD-10-CM | POA: Diagnosis not present

## 2023-08-17 DIAGNOSIS — I70209 Unspecified atherosclerosis of native arteries of extremities, unspecified extremity: Secondary | ICD-10-CM | POA: Diagnosis not present

## 2023-08-17 DIAGNOSIS — R41841 Cognitive communication deficit: Secondary | ICD-10-CM | POA: Diagnosis not present

## 2023-08-17 DIAGNOSIS — M869 Osteomyelitis, unspecified: Secondary | ICD-10-CM | POA: Diagnosis not present

## 2023-08-17 DIAGNOSIS — E1151 Type 2 diabetes mellitus with diabetic peripheral angiopathy without gangrene: Secondary | ICD-10-CM | POA: Diagnosis not present

## 2023-08-17 DIAGNOSIS — E441 Mild protein-calorie malnutrition: Secondary | ICD-10-CM | POA: Diagnosis not present

## 2023-08-17 DIAGNOSIS — I10 Essential (primary) hypertension: Secondary | ICD-10-CM | POA: Diagnosis not present

## 2023-08-17 DIAGNOSIS — Z89421 Acquired absence of other right toe(s): Secondary | ICD-10-CM | POA: Diagnosis not present

## 2023-08-17 DIAGNOSIS — R531 Weakness: Secondary | ICD-10-CM | POA: Diagnosis not present

## 2023-08-17 DIAGNOSIS — E559 Vitamin D deficiency, unspecified: Secondary | ICD-10-CM | POA: Diagnosis not present

## 2023-08-17 DIAGNOSIS — D649 Anemia, unspecified: Secondary | ICD-10-CM | POA: Diagnosis not present

## 2023-08-17 DIAGNOSIS — Z89512 Acquired absence of left leg below knee: Secondary | ICD-10-CM | POA: Diagnosis not present

## 2023-08-17 DIAGNOSIS — I739 Peripheral vascular disease, unspecified: Secondary | ICD-10-CM | POA: Diagnosis not present

## 2023-08-17 DIAGNOSIS — M6281 Muscle weakness (generalized): Secondary | ICD-10-CM | POA: Diagnosis not present

## 2023-08-17 DIAGNOSIS — F0150 Vascular dementia without behavioral disturbance: Secondary | ICD-10-CM | POA: Diagnosis not present

## 2023-08-17 DIAGNOSIS — I69354 Hemiplegia and hemiparesis following cerebral infarction affecting left non-dominant side: Secondary | ICD-10-CM | POA: Diagnosis not present

## 2023-08-17 DIAGNOSIS — E785 Hyperlipidemia, unspecified: Secondary | ICD-10-CM | POA: Diagnosis not present

## 2023-08-17 DIAGNOSIS — Z4781 Encounter for orthopedic aftercare following surgical amputation: Secondary | ICD-10-CM | POA: Diagnosis not present

## 2023-08-17 LAB — GLUCOSE, CAPILLARY
Glucose-Capillary: 128 mg/dL — ABNORMAL HIGH (ref 70–99)
Glucose-Capillary: 126 mg/dL — ABNORMAL HIGH (ref 70–99)

## 2023-08-17 LAB — CBC
HCT: 32.7 % — ABNORMAL LOW (ref 39.0–52.0)
Hemoglobin: 10 g/dL — ABNORMAL LOW (ref 13.0–17.0)
MCH: 29.6 pg (ref 26.0–34.0)
MCHC: 30.6 g/dL (ref 30.0–36.0)
MCV: 96.7 fL (ref 80.0–100.0)
Platelets: 83 K/uL — ABNORMAL LOW (ref 150–400)
RBC: 3.38 MIL/uL — ABNORMAL LOW (ref 4.22–5.81)
RDW: 13 % (ref 11.5–15.5)
WBC: 5 K/uL (ref 4.0–10.5)
nRBC: 0.4 % — ABNORMAL HIGH (ref 0.0–0.2)

## 2023-08-17 MED ORDER — FLUOXETINE HCL 20 MG PO CAPS: 20.0000 mg | ORAL_CAPSULE | Freq: Every day | ORAL | Status: AC

## 2023-08-17 MED ORDER — ASPIRIN 81 MG PO TBEC: 81.0000 mg | DELAYED_RELEASE_TABLET | Freq: Every day | ORAL | Status: AC

## 2023-08-17 NOTE — Progress Notes (Signed)
 AVS and DNR form in packet for [facility]

## 2023-08-17 NOTE — TOC Transition Note (Signed)
 Transition of Care [facility]) - Discharge Note   Patient Details  Name: Chad Grant MRN: [id number] Date of Birth: May 23, 1950  Transition of [facility]) CM/SW Contact:  Olam FORBES Ally, LCSW Phone Number: 08/17/2023, 12:12 PM   Clinical Narrative:     Patient will DC to:?[facility] Anticipated DC date:?08/17/2023 Family notified:?wife- [name] Transport by: [facility]   Per MD patient ready for DC to [facility].?. RN, patient, patient's family, and facility notified of DC. Discharge Summary sent to facility. RN given number for report [phone].DC packet on chart. Ambulance transport requested for patient.   CSW signing off.   Olam Ally, [place] [phone]   Final next level of care: Skilled Nursing Facility Barriers to Discharge: Barriers Resolved   Patient Goals and CMS Choice     Choice offered to / list presented to : Spouse      Discharge Placement              Patient chooses bed at: [facility] Patient to be transferred to facility by: [facility] Name of family member notified: Wifw [name] Patient and family notified of of transfer: 08/17/23  Discharge Plan and Services Additional resources added to the After Visit Summary for   In-house Referral: Clinical Social Work Discharge Planning Services: CM Consult Post Acute Care Choice: Home Health            DME Agency: NA       HH Arranged: PT, OT HH Agency: [facility] Date [facility] Agency Contacted: 08/12/23 Time HH Agency Contacted: 1118 Representative spoke with at [facility] Agency: Amy Hyatt  Social Drivers of Health (SDOH) Interventions SDOH Screenings   Food Insecurity: No Food Insecurity (08/08/2023)  Housing: Low Risk  (08/08/2023)  Transportation Needs: No Transportation Needs (08/08/2023)  Utilities: Not At Risk (08/08/2023)  Depression (PHQ2-9): Low Risk  (03/28/2021)  Financial Resource Strain: Low Risk  (04/02/2021)   Received from [facility]  Social Connections: Socially Isolated  (08/08/2023)  Tobacco Use: Low Risk  (08/13/2023)     Readmission Risk Interventions     No data to display

## 2023-08-17 NOTE — Discharge Summary (Addendum)
 Physician Discharge Summary   Patient: Chad Grant MRN: [id number] DOB: [birth year]  Admit date:     08/07/2023  Discharge date: 08/17/23  Discharge Physician: AIDA CHO   PCP: Maree Isles, MD   Recommendations at discharge:   Follow-up with Dr. Harden, orthopedic surgeon, in 1 week  Discharge Diagnoses: Principal Problem:   Osteomyelitis of fifth toe of right foot (HCC) Active Problems:   HTN (hypertension)   Diabetes type 2 with atherosclerosis of arteries of extremities (HCC)   PAD (peripheral artery disease) (HCC)  Resolved Problems:   * No resolved hospital problems. [facility] Course:  Chad Grant is a 73 y.o. male with past medical history of peripheral vascular disease status post left BKA 2010 in [place], 3rd and 4th right toe amputation 2014 by Dr. Serene, history of CVA, with residual left-sided weakness, vascular dementia, diabetes mellitus, hyperlipidemia presented for evaluation of progressive right fifth great toe wound over the last few weeks prior to admission.  He noticed significant discoloration and loss of sensation in this in the 2 weeks preceding admission.  He usually ambulates slowly with walker and left leg prosthesis at baseline.   He was admitted to the hospital for right fifth toe gangrene.  He underwent right 4th and 5th ray amputation on 08/13/2023.    Assessment and Plan:   Right fifth toe gangrene: Seen by vascular surgery and orthopedic surgery. S/p right foot fifth ray amputation, fourth ray revision. Analgesics as needed for pain. Follow-up with Dr. Harden, orthopedic surgeon, in 1 week. Postop shoe as needed. PT and OT recommended discharge to SNF. Continue aspirin  and Plavix   Vascular surgeon also performed third order cannulation of right peroneal artery. Intravascular lithotripsy of the right peroneal and TP trunk, 4 mm E8 shockwave on 08/11/2023     Acute blood loss anemia: H&H stable.  Hemoglobin trending  12-9.7-10.0.  No indication for blood transfusion at this time.     History of CVA with residual left-sided weakness: Continue Plavix  and statin     Type II diabetes mellitus: Resume glipizide at discharge A1c 6.2.       Comorbidities include hypertension, vascular dementia                  Consultants: Orthopedic surgeon, vascular surgeon Procedures performed:  Right 4th and 5th ray amputation on 08/13/2023 Third order cannulation of right peroneal artery Intravascular lithotripsy of the right peroneal and TP trunk, 4 mm E8 shockwave by vascular surgeon on 08/11/2023 Disposition: Skilled nursing facility Diet recommendation:  Cardiac and Carb modified diet DISCHARGE MEDICATION: Allergies as of 08/17/2023   No Known Allergies      Medication List     TAKE these medications    acetaminophen  500 MG tablet Commonly known as: TYLENOL  Take 500 mg by mouth every 6 (six) hours as needed for mild pain (pain score 1-3).   aspirin  EC 81 MG tablet Take 1 tablet (81 mg total) by mouth daily. Swallow whole. Start taking on: August 18, 2023   clopidogrel  75 MG tablet Commonly known as: PLAVIX  Take 1 tablet (75 mg total) by mouth daily.   FLUoxetine  20 MG capsule Commonly known as: PROZAC  Take 1 capsule (20 mg total) by mouth at bedtime.   gabapentin  100 MG capsule Commonly known as: NEURONTIN  Take 100 mg by mouth at bedtime.   glipiZIDE 5 MG tablet Commonly known as: GLUCOTROL Take 5 mg by mouth daily.   loperamide 2 MG capsule Commonly known as:  IMODIUM Take 2 mg by mouth as needed for diarrhea or loose stools.   Melatonin 5 MG Caps Take 10 mg by mouth at bedtime as needed (sleep).   metoprolol  tartrate 25 MG tablet Commonly known as: LOPRESSOR  Take 25 mg by mouth daily.   pantoprazole  20 MG tablet Commonly known as: PROTONIX  Take 1 tablet by mouth daily as needed for heartburn or indigestion.   rosuvastatin  5 MG tablet Commonly known as: CRESTOR  Take  5 mg by mouth every other day.               Discharge Care Instructions  (From admission, onward)           Start     Ordered   08/17/23 0000  Change dressing       Comments: Apply dry dressing right foot change daily.   08/17/23 1132   08/16/23 0000  Non weight bearing       Question Answer Comment  Laterality right   Extremity Lower      08/16/23 1020            Contact information for follow-up providers     Maree Isles, MD Follow up.   Specialty: Internal Medicine Contact information: [address] [place] [address] [phone]         [facility] Follow up.   Why: Someone from [facility] will contact you to arrange start date and time for your Home Health therapy. Contact information: [address] [place] [address] [phone]         Harden Jerona GAILS, MD Follow up in 1 week(s).   Specialty: Orthopedic Surgery Contact information: [address] [place] [address] [phone]              Contact information for after-discharge care     Destination     [facility] .   Service: Skilled Nursing Contact information: [address] [facility] [place]  [address] [phone]                    Discharge Exam: Chad Grant   08/07/23 1337 08/13/23 1237  Weight: 68.6 kg 68.6 kg   GEN: NAD SKIN: Warm and dry EYES: No pallor or icterus ENT: MMM CV: RRR PULM: CTA B ABD: soft, ND, NT, +BS CNS: AAO x 3, non focal EXT: Left BKA.  Right foot surgical wound with wound VAC in place     Condition at discharge: good  The results of significant diagnostics from this hospitalization (including imaging, microbiology, ancillary and laboratory) are listed below for reference.   Imaging Studies: PERIPHERAL VASCULAR CATHETERIZATION Result Date: 08/11/2023 Images from the original result were not included. Patient name: Chad Grant MRN: [id number] DOB: [birth year]  Sex: male 08/11/2023 Pre-operative Diagnosis: CLTI with osteomyelitis of right 5th toe wound Post-operative diagnosis:  Same Surgeon:  Norman GORMAN Serve, MD Procedure Performed: Ultrasound-guided access of the left common femoral artery Aortogram and right lower extremity angiogram Third order cannulation of right peroneal artery Intravascular lithotripsy of the right peroneal and TP trunk, 4 mm E8 shockwave Mynx closure of the left common femoral artery Indications: 73 year old male with CL TI and osteomyelitis of the right fifth toe.  He has a significant history of PAD and has a left BKA.  Recent benefits of angiogram with intervention were reviewed, he expressed understanding and elected to proceed. Findings: Widely patent aorta and bilateral iliac systems. Right common femoral,  SFA and profunda are patent although there is evidence of medial calcinosis.  The popliteal artery is also calcified although patent throughout its course.  The TP trunk has an approximate 70% calcific stenosis.  There is one-vessel runoff via the peroneal which has a severe, 90% calcific stenosis in the midsegment.  Severe pedal disease.  Procedure:  The patient was identified in the holding area and taken to the cath lab  The patient was then placed supine on the table and prepped and draped in the usual sterile fashion.  A time out was called.  Ultrasound was used to evaluate the left common femoral artery.  It was patent .  A digital ultrasound image was acquired.  A micropuncture needle was used to access the left common femoral artery under ultrasound guidance.  An 018 wire was advanced without resistance and a micropuncture sheath was placed.  The 018 wire was removed and a benson wire was placed.  The micropuncture sheath was exchanged for a 5 french sheath.  An omniflush catheter was advanced over the wire to the level of L-1.  An abdominal angiogram was obtained.  Next, using the omniflush catheter and a Bentson wire, the aortic  bifurcation was crossed and the catheter was placed into theright external iliac artery and right runoff was obtained. This demonstrated the above findings.  The Bentson wire was then replaced through the catheter and into the right SFA.  The patient was systemically heparinized and the short 5 French sheath was exchanged for a 5 [place] by 45 cm catapult sheath.  Using an 014 command wire and a CXI catheter I was able to navigate across the TP trunk and peroneal lesions.  An angiogram via the catheter demonstrated true lumen access distally.  The wire was replaced to the catheter and the lesion was treated with the 4 mm E8 shockwave balloon.  4 treatments were delivered to the peroneal stenosis and 4 treatments were delivered to the TP trunk stenosis.  Completion angiogram demonstrated an adequate result with an approximate 30% residual stenosis in the TP trunk and a less than 20% residual stenosis in the peroneal with some nonflow limiting dissection.  It should be noted that he has severe pedal disease and this high risk for amputation.  The Bentson wire was placed through the sheath.  The long 5 French sheath was exchanged for a short 5 [place] sheath and a minx closure device was deployed with excellent hemostasis. Contrast: 55 cc Impression: Adequate response to intravascular lithotripsy of the peroneal and TP trunk lesions, one-vessel runoff via the peroneal.  Severe pedal disease with no visualization of the PT and a minimal dorsalis pedis in the foot. Continues to be high risk for major amputation. Norman GORMAN Serve MD [facility] Office: [phone]   US  ARTERIAL ABI (SCREENING LOWER EXTREMITY) Result Date: 08/07/2023 CLINICAL DATA:  Right foot infection EXAM: NONINVASIVE PHYSIOLOGIC VASCULAR STUDY OF BILATERAL LOWER EXTREMITIES TECHNIQUE: Evaluation of both lower extremities were performed at rest, including calculation of ankle-brachial indices with single level Doppler,  pressure and pulse volume recording. COMPARISON:  05/11/2018, report 04/06/2012, 02/28/2010, 11/06/2009 FINDINGS: Right ABI:  1.08 Left ABI:  Not evaluated due to amputation Right Lower Extremity: Abnormal monophasic appearing waveforms at the ankle and foot Left Lower Extremity:  Not evaluated due to amputation 1.0-1.4 Normal IMPRESSION: 1. ABI value is within the normal range, however dampened monophasic appearing waveforms at the right ankle and foot, findings could be secondary to  false normal ABI secondary to diffuse calcific disease the foot and ankle. If further imaging is required, CT angiography with runoff would be advised Electronically Signed   By: Luke Bun M.D.   On: 08/07/2023 18:51   DG Toe 5th Right Result Date: 08/07/2023 CLINICAL DATA:  Infection.  Toe contractures. EXAM: RIGHT FIFTH TOE COMPARISON:  Radiographs 05/15/2018 and 05/13/2018. FINDINGS: There is new osteolysis and fragmentation of the head of the 5th proximal phalanx, suspicious for osteomyelitis in the appropriate clinical context. The 5th toe otherwise appears intact with a stable clawtoe deformity. There are stable postsurgical changes from previous amputation of the 4th toe and head of the 4th metatarsal. No other changes are identified. IMPRESSION: New osteolysis and fragmentation of the head of the 5th proximal phalanx, suspicious for osteomyelitis in the appropriate clinical context. Electronically Signed   By: Elsie Perone M.D.   On: 08/07/2023 14:39    Microbiology: Results for orders placed or performed during the hospital encounter of 08/07/23  Blood culture (routine x 2)     Status: None   Collection Time: 08/07/23  3:35 PM   Specimen: BLOOD LEFT FOREARM  Result Value Ref Range Status   Specimen Description BLOOD LEFT FOREARM  Final   Special Requests   Final    BOTTLES DRAWN AEROBIC AND ANAEROBIC Blood Culture adequate volume   Culture   Final    NO GROWTH 5 DAYS Performed at [facility],  [address] [place], [place] [address]    Report Status 08/12/2023 FINAL  Final  Blood culture (routine x 2)     Status: None   Collection Time: 08/07/23  3:40 PM   Specimen: BLOOD LEFT ARM  Result Value Ref Range Status   Specimen Description BLOOD LEFT ARM  Final   Special Requests   Final    BOTTLES DRAWN AEROBIC AND ANAEROBIC Blood Culture results may not be optimal due to an inadequate volume of blood received in culture bottles   Culture   Final    NO GROWTH 5 DAYS Performed at [facility], [address], [place], [place] [address]    Report Status 08/12/2023 FINAL  Final  Surgical pcr screen     Status: None   Collection Time: 08/10/23  5:39 PM   Specimen: Nasal Mucosa; Nasal Swab  Result Value Ref Range Status   MRSA, PCR NEGATIVE NEGATIVE Final   Staphylococcus aureus NEGATIVE NEGATIVE Final    Comment: (NOTE) The Xpert SA Assay (FDA approved for NASAL specimens in patients 48 years of age and older), is one component of a comprehensive surveillance program. It is not intended to diagnose infection nor to guide or monitor treatment. Performed at [facility], [address], [place], [place] [address]     Labs: CBC: Recent Labs  Lab 08/13/23 [phone] 08/14/23 0344 08/15/23 0332 08/16/23 0209 08/17/23 0419  WBC 7.4 6.4 6.9 6.4 5.0  HGB 11.6* 10.3* 10.0* 9.7* 10.0*  HCT 34.7* 31.1* 30.7* 28.8* 32.7*  MCV 88.1 88.4 89.2 88.3 96.7  PLT 139* 132* 125* 116* 83*   Basic Metabolic Panel: Recent Labs  Lab 08/12/23 0838 08/13/23 0333 08/14/23 0344  NA 135 139 138  K 4.0 3.8 4.1  CL 99 104 102  CO2 28 27 29   GLUCOSE 128* 171* 146*  BUN 9 11 10   CREATININE 0.69 0.64 0.80  CALCIUM  9.1 8.8* 8.8*   Liver Function Tests: Recent Labs  Lab 08/12/23 0838  AST 19  ALT  7  ALKPHOS 81  BILITOT 1.0  PROT 6.6  ALBUMIN 3.2*   CBG: Recent Labs  Lab 08/16/23 1224 08/16/23 1609 08/16/23 2124 08/17/23 0608 08/17/23 1202  GLUCAP 141* 157* 122* 128* 126*     Discharge time spent: greater than 30 minutes.  Signed: AIDA CHO, MD [facility] 08/17/2023

## 2023-08-17 NOTE — TOC Progression Note (Signed)
 Transition of Care [facility]) - Progression Note    Patient Details  Name: Chad Grant MRN: [id number] Date of Birth: 09-Jun-1950  Transition of [facility] CM/SW Contact  Olam FORBES Ally, LCSW Phone Number: 08/17/2023, 9:53 AM  Clinical Narrative:     CSW received a call from Tammy at [facility] and was notified that pt's SNF and PTAR authorizations hae been approved.  SNF#- [id number] PTAR#- [id number]  MD alerted.  TOC team will continue to assist with discharge planning needs.  Expected Discharge Plan: Skilled Nursing Facility Barriers to Discharge: Continued Medical Work up               Expected Discharge Plan and Services In-house Referral: Clinical Social Work Discharge Planning Services: CM Consult Post Acute Care Choice: Home Health Living arrangements for the past 2 months: Single Family Home                   DME Agency: NA       HH Arranged: PT, OT HH Agency: [facility] Date HH Agency Contacted: 08/12/23 Time HH Agency Contacted: 1118 Representative spoke with at [facility] Agency: Amy Hyatt   Social Drivers of Health (SDOH) Interventions SDOH Screenings   Food Insecurity: No Food Insecurity (08/08/2023)  Housing: Low Risk  (08/08/2023)  Transportation Needs: No Transportation Needs (08/08/2023)  Utilities: Not At Risk (08/08/2023)  Depression (PHQ2-9): Low Risk  (03/28/2021)  Financial Resource Strain: Low Risk  (04/02/2021)   Received from [employer]  Social Connections: Socially Isolated (08/08/2023)  Tobacco Use: Low Risk  (08/13/2023)    Readmission Risk Interventions     No data to display

## 2023-08-18 DIAGNOSIS — I69354 Hemiplegia and hemiparesis following cerebral infarction affecting left non-dominant side: Secondary | ICD-10-CM | POA: Diagnosis not present

## 2023-08-18 DIAGNOSIS — Z4781 Encounter for orthopedic aftercare following surgical amputation: Secondary | ICD-10-CM | POA: Diagnosis not present

## 2023-08-19 DIAGNOSIS — I70209 Unspecified atherosclerosis of native arteries of extremities, unspecified extremity: Secondary | ICD-10-CM | POA: Diagnosis not present

## 2023-08-19 DIAGNOSIS — Z89421 Acquired absence of other right toe(s): Secondary | ICD-10-CM | POA: Diagnosis not present

## 2023-08-19 DIAGNOSIS — F339 Major depressive disorder, recurrent, unspecified: Secondary | ICD-10-CM | POA: Diagnosis not present

## 2023-08-19 DIAGNOSIS — Z4781 Encounter for orthopedic aftercare following surgical amputation: Secondary | ICD-10-CM | POA: Diagnosis not present

## 2023-08-19 DIAGNOSIS — I739 Peripheral vascular disease, unspecified: Secondary | ICD-10-CM | POA: Diagnosis not present

## 2023-08-19 DIAGNOSIS — E785 Hyperlipidemia, unspecified: Secondary | ICD-10-CM | POA: Diagnosis not present

## 2023-08-19 DIAGNOSIS — F0150 Vascular dementia without behavioral disturbance: Secondary | ICD-10-CM | POA: Diagnosis not present

## 2023-08-19 DIAGNOSIS — I10 Essential (primary) hypertension: Secondary | ICD-10-CM | POA: Diagnosis not present

## 2023-08-25 ENCOUNTER — Other Ambulatory Visit: Payer: Self-pay | Admitting: *Deleted

## 2023-08-25 DIAGNOSIS — I739 Peripheral vascular disease, unspecified: Secondary | ICD-10-CM

## 2023-08-25 DIAGNOSIS — E559 Vitamin D deficiency, unspecified: Secondary | ICD-10-CM | POA: Diagnosis not present

## 2023-08-25 DIAGNOSIS — I10 Essential (primary) hypertension: Secondary | ICD-10-CM | POA: Diagnosis not present

## 2023-08-25 DIAGNOSIS — E1151 Type 2 diabetes mellitus with diabetic peripheral angiopathy without gangrene: Secondary | ICD-10-CM | POA: Diagnosis not present

## 2023-08-25 DIAGNOSIS — D649 Anemia, unspecified: Secondary | ICD-10-CM | POA: Diagnosis not present

## 2023-09-01 ENCOUNTER — Ambulatory Visit (INDEPENDENT_AMBULATORY_CARE_PROVIDER_SITE_OTHER): Admitting: Orthopedic Surgery

## 2023-09-01 DIAGNOSIS — Z89421 Acquired absence of other right toe(s): Secondary | ICD-10-CM

## 2023-09-02 ENCOUNTER — Encounter: Payer: Self-pay | Admitting: Orthopedic Surgery

## 2023-09-02 NOTE — Progress Notes (Signed)
 Office Visit Note   Patient: Chad Grant           Date of Birth: 12-14-1950           MRN: [id number] Visit Date: 09/01/2023              Requested by: Maree Isles, MD [address] [place],  [place] [address] PCP: Maree Isles, MD  Chief Complaint  Patient presents with   Right Foot - Routine Post Op    08/13/2023 right 4th and 5th ray amputation       HPI: Patient is a 73 year old gentleman who presents 2 weeks status post right foot fifth ray amputation.  He has been nonweightbearing with dry dressing changes.  He states he feels well without concerns.  Assessment & Plan: Visit Diagnoses:  1. History of partial ray amputation of fifth toe of right foot (HCC)     Plan: The patient is showing excellent healing with flat granulation tissue.  We will have him start with a nitroglycerin patch and a prescription was provided for skilled nursing to change this daily.  Follow-Up Instructions: Return in about 2 weeks (around 09/15/2023).   Ortho Exam  Patient is alert, oriented, no adenopathy, well-dressed, normal affect, normal respiratory effort.  Examination of the right foot the wound is healing well there is flat granulation tissue that measures 1 x 2 cm in maximal dimension.  Will start patient on nitroglycerin patch to help with his microcirculation.     Imaging: No results found. No images are attached to the encounter.  Labs: Lab Results  Component Value Date   HGBA1C 6.2 (H) 08/07/2023   HGBA1C 7.2 (H) 05/13/2018   HGBA1C (H) 08/14/2007    6.3 (NOTE)   The ADA recommends the following therapeutic goal for glycemic   control related to Hgb A1C measurement:   Goal of Therapy:   < 7.0% Hgb A1C   Reference: American Diabetes Association: Clinical Practice   Recommendations 2008, Diabetes Care,  2008, 31:(Suppl 1).   REPTSTATUS 08/12/2023 FINAL 08/07/2023   CULT  08/07/2023    NO GROWTH 5 DAYS Performed at [facility], [address] [place], [place] [address]       Lab Results  Component Value Date   ALBUMIN 3.2 (L) 08/12/2023   ALBUMIN 4.1 07/02/2012    No results found for: MG No results found for: VD25OH  No results found for: PREALBUMIN    Latest Ref Rng & Units 08/17/2023    4:19 AM 08/16/2023    2:09 AM 08/15/2023    3:32 AM  CBC EXTENDED  WBC 4.0 - 10.5 K/uL 5.0  6.4  6.9   RBC 4.22 - 5.81 MIL/uL 3.38  3.26  3.44   Hemoglobin 13.0 - 17.0 g/dL 89.9  9.7  89.9   HCT 60.9 - 52.0 % 32.7  28.8  30.7   Platelets 150 - 400 K/uL 83  116  125      There is no height or weight on file to calculate BMI.  Orders:  No orders of the defined types were placed in this encounter.  No orders of the defined types were placed in this encounter.    Procedures: No procedures performed  Clinical Data: No additional findings.  ROS:  All other systems negative, except as noted in the HPI. Review of Systems  Objective: Vital Signs: There were no vitals taken for this visit.  Specialty Comments:  No specialty comments available.  PMFS History:  Patient Active Problem List   Diagnosis Date Noted   Osteomyelitis (neonatal) of jaw (suppurative) 08/07/2023   Osteomyelitis of fifth toe of right foot (HCC) 08/07/2023   Loss of weight 12/15/2018   Change in bowel habits 12/15/2018   PAD (peripheral artery disease) (HCC) 04/20/2012   Occlusion and stenosis of carotid artery without mention of cerebral infarction 04/20/2012   Depression 02/11/2012   Diabetes type 2 with atherosclerosis of arteries of extremities (HCC) 02/11/2012   HTN (hypertension) 03/19/2011   Past Medical History:  Diagnosis Date   Dementia (HCC)    Depression    Diabetes mellitus type II    Hypercholesteremia    Hypertension    S/P BKA (below knee amputation) unilateral (HCC)    Stroke (HCC) 2013   left sided weakness   TIA (transient ischemic attack)     Family History  Problem Relation Age of Onset   Heart disease Mother    Diabetes Father     Alcohol  abuse Brother    Diabetes Brother    Diabetes Sister    Peripheral vascular disease Sister        amputation   Diabetes Daughter    Anxiety disorder Neg Hx    Bipolar disorder Neg Hx    Dementia Neg Hx    Depression Neg Hx    Drug abuse Neg Hx    Paranoid behavior Neg Hx    Schizophrenia Neg Hx    OCD Neg Hx    Seizures Neg Hx    Sexual abuse Neg Hx    Physical abuse Neg Hx    ADD / ADHD Neg Hx    Colon cancer Neg Hx    Colon polyps Neg Hx     Past Surgical History:  Procedure Laterality Date   ABDOMINAL AORTAGRAM N/A 04/29/2012   Procedure: ABDOMINAL AORTAGRAM;  Surgeon: Gaile LELON New, MD;  Location: [facility] CATH LAB;  Service: Cardiovascular;  Laterality: N/A;   ABDOMINAL AORTOGRAM W/LOWER EXTREMITY N/A 08/11/2023   Procedure: ABDOMINAL AORTOGRAM W/LOWER EXTREMITY;  Surgeon: Pearline Norman RAMAN, MD;  Location: [facility] INVASIVE CV LAB;  Service: Cardiovascular;  Laterality: N/A;   AMPUTATION Right 05/15/2018   Procedure: AMPUTATION RIGHT THIRD TOE;  Surgeon: Blinda Katz, DPM;  Location: [facility] ORS;  Service: Podiatry;  Laterality: Right;   AMPUTATION Right 08/13/2023   Procedure: AMPUTATION, FOOT, RAY;  Surgeon: Harden Jerona GAILS, MD;  Location: [facility] OR;  Service: Orthopedics;  Laterality: Right;  RIGHT  FOOT 5TH RAY AMPUTATION   CATARACT EXTRACTION W/PHACO Left 12/04/2015   Procedure: CATARACT EXTRACTION PHACO AND INTRAOCULAR LENS PLACEMENT LEFT EYE CDE=19.92;  Surgeon: Cherene Mania, MD;  Location: [facility] ORS;  Service: Ophthalmology;  Laterality: Left;  left   cataract removed Bilateral    left leg amputa     BKA   left leg amputated     LOWER EXTREMITY INTERVENTION  08/11/2023   Procedure: LOWER EXTREMITY INTERVENTION;  Surgeon: Pearline Norman RAMAN, MD;  Location: [facility] INVASIVE CV LAB;  Service: Cardiovascular;;   SHOULDER SURGERY Right    rotator cuff   Social History   Occupational History   Not on file  Tobacco Use   Smoking status: Never    Passive exposure: Never   Smokeless  tobacco: Never  Vaping Use   Vaping status: Never Used  Substance and Sexual Activity   Alcohol  use: No    Comment: in the past but not since amputation in 2009, drank alcohol  on weekends    Drug use:  No   Sexual activity: Not Currently

## 2023-09-03 DIAGNOSIS — Z4781 Encounter for orthopedic aftercare following surgical amputation: Secondary | ICD-10-CM | POA: Diagnosis not present

## 2023-09-03 DIAGNOSIS — E1151 Type 2 diabetes mellitus with diabetic peripheral angiopathy without gangrene: Secondary | ICD-10-CM | POA: Diagnosis not present

## 2023-09-03 DIAGNOSIS — I10 Essential (primary) hypertension: Secondary | ICD-10-CM | POA: Diagnosis not present

## 2023-09-03 DIAGNOSIS — Z89421 Acquired absence of other right toe(s): Secondary | ICD-10-CM | POA: Diagnosis not present

## 2023-09-03 DIAGNOSIS — D649 Anemia, unspecified: Secondary | ICD-10-CM | POA: Diagnosis not present

## 2023-09-04 ENCOUNTER — Telehealth: Payer: Self-pay | Admitting: Orthopedic Surgery

## 2023-09-04 NOTE — Telephone Encounter (Signed)
 Kristin from [facility] called. While on the phone, she found the answer to her question.

## 2023-09-10 DIAGNOSIS — Z89421 Acquired absence of other right toe(s): Secondary | ICD-10-CM | POA: Diagnosis not present

## 2023-09-10 DIAGNOSIS — Z4781 Encounter for orthopedic aftercare following surgical amputation: Secondary | ICD-10-CM | POA: Diagnosis not present

## 2023-09-10 DIAGNOSIS — I10 Essential (primary) hypertension: Secondary | ICD-10-CM | POA: Diagnosis not present

## 2023-09-10 DIAGNOSIS — E1151 Type 2 diabetes mellitus with diabetic peripheral angiopathy without gangrene: Secondary | ICD-10-CM | POA: Diagnosis not present

## 2023-09-18 ENCOUNTER — Ambulatory Visit (INDEPENDENT_AMBULATORY_CARE_PROVIDER_SITE_OTHER): Admitting: Orthopedic Surgery

## 2023-09-18 DIAGNOSIS — Z89421 Acquired absence of other right toe(s): Secondary | ICD-10-CM

## 2023-09-19 ENCOUNTER — Ambulatory Visit (HOSPITAL_COMMUNITY)
Admission: RE | Admit: 2023-09-19 | Discharge: 2023-09-19 | Disposition: A | Discharge status: Home / Self Care | Source: Ambulatory Visit | Attending: Vascular Surgery | Admitting: Vascular Surgery

## 2023-09-19 ENCOUNTER — Encounter: Payer: Self-pay | Admitting: Orthopedic Surgery

## 2023-09-19 ENCOUNTER — Ambulatory Visit: Attending: Vascular Surgery | Admitting: Physician Assistant

## 2023-09-19 ENCOUNTER — Ambulatory Visit (HOSPITAL_BASED_OUTPATIENT_CLINIC_OR_DEPARTMENT_OTHER)
Admission: RE | Admit: 2023-09-19 | Discharge: 2023-09-19 | Disposition: A | Discharge status: Home / Self Care | Source: Ambulatory Visit | Attending: Vascular Surgery | Admitting: Vascular Surgery

## 2023-09-19 VITALS — BP 99/55 | HR 62 | Temp 97.9°F | Wt 151.0 lb

## 2023-09-19 DIAGNOSIS — I739 Peripheral vascular disease, unspecified: Secondary | ICD-10-CM | POA: Insufficient documentation

## 2023-09-19 NOTE — Progress Notes (Signed)
 Office Visit Note   Patient: Chad Grant           Date of Birth: April 02, 1950           MRN: [id number] Visit Date: 09/18/2023              Requested by: Maree Isles, MD [address] [place],  [place] [address] PCP: Maree Isles, MD  Chief Complaint  Patient presents with   Right Foot - Routine Post Op    08/13/2023 right 4th and 5th ray amputation       HPI: Discussed the use of AI scribe software for clinical note transcription with the patient, who gave verbal consent to proceed.  History of Present Illness Chad Grant is a 73 year old male who presents for follow-up of a surgical incision.  He is approximately five weeks post-surgery and is currently residing in a nursing home. The surgical incision is healing well with the use of a natural glycerin patch, which he applies daily. The patch has been beneficial in the healing process.       Assessment & Plan: Visit Diagnoses:  1. History of partial ray amputation of fifth toe of right foot (HCC)     Plan: Assessment and Plan Assessment & Plan Postoperative wound care, healing well Five weeks postoperative. Incision clean, dry, well approximated. No drainage, cellulitis, or ischemic changes. Healing progressing well with nitroglycerin patch - Continue nitroglycerin patch daily. - Remove stitches today. - Apply dry dressing. - Allow wearing regular [employer] sneakers. - Schedule follow-up in four weeks.      Follow-Up Instructions: Return in about 4 weeks (around 10/16/2023).   Ortho Exam  Patient is alert, oriented, no adenopathy, well-dressed, normal affect, normal respiratory effort. Physical Exam SKIN: Incision clean, dry, well approximated, no drainage, no cellulitis, no ischemic changes.   The incision is healing well.   Imaging: VAS US  ABI WITH/WO TBI Result Date: 09/19/2023  LOWER EXTREMITY DOPPLER STUDY Patient Name:  Chad Grant [facility]  Date of Exam:   09/19/2023 Medical Rec #: [id number]          Accession #:    [id number] Date of Birth: 07-Feb-1950         Patient Gender: M Patient Age:   54 years Exam Location:  [address] Procedure:      VAS US  ABI WITH/WO TBI Referring Phys: NORMAN SERVE --------------------------------------------------------------------------------  Indications: Peripheral artery disease. left AKA High Risk Factors: Hypertension, hyperlipidemia, Diabetes, no history of                    smoking.  Vascular Interventions: Lithotripsy right peroneal artery and right TPT on                         08/11/23. I vessel run off with right peroneal artrery.                         Severe pedal disease. Comparison Study: 4/320/20 right ABI was 1.12. Left lower leg amputation Performing Technologist: Alan Greenhouse RDMS, RVT, RDCS  Examination Guidelines: A complete evaluation includes at minimum, Doppler waveform signals and systolic blood pressure reading at the level of bilateral brachial, anterior tibial, and posterior tibial arteries, when vessel segments are accessible. Bilateral testing is considered an integral part of a complete examination. Photoelectric Plethysmograph (PPG) waveforms and toe systolic pressure readings are included as required and additional duplex  testing as needed. Limited examinations for reoccurring indications may be performed as noted.  ABI Findings: +---------+------------------+-----+-------------------+--------+ Right    Rt Pressure (mmHg)IndexWaveform           Comment  +---------+------------------+-----+-------------------+--------+ Brachial 118                                                +---------+------------------+-----+-------------------+--------+ ATA      146               1.17 monophasic                  +---------+------------------+-----+-------------------+--------+ PTA      135               1.08 dampened monophasic         +---------+------------------+-----+-------------------+--------+ PERO     118                0.94 biphasic                    +---------+------------------+-----+-------------------+--------+ Great Toe32                0.26 Abnormal                    +---------+------------------+-----+-------------------+--------+ +--------+------------------+-----+--------+-------+ Left    Lt Pressure (mmHg)IndexWaveformComment +--------+------------------+-----+--------+-------+ [id number]                                    +--------+------------------+-----+--------+-------+ +-------+-----------+-----------+------------+------------+ ABI/TBIToday's ABIToday's TBIPrevious ABIPrevious TBI +-------+-----------+-----------+------------+------------+ Right  1.17       0.26       1.12                     +-------+-----------+-----------+------------+------------+  Summary: Right: Although ankle brachial indices are within normal limits (0.95-1.29), arterial Doppler waveforms at the ankle suggest some component of arterial occlusive disease. *See table(s) above for measurements and observations.     Preliminary    VAS US  LOWER EXTREMITY ARTERIAL DUPLEX Result Date: 09/19/2023 LOWER EXTREMITY ARTERIAL DUPLEX STUDY Patient Name:  Chad Grant [facility]  Date of Exam:   09/19/2023 Medical Rec #: [id number]         Accession #:    [id number] Date of Birth: [birth year]         Patient Gender: M Patient Age:   65 years Exam Location:  [address] Procedure:      VAS US  LOWER EXTREMITY ARTERIAL DUPLEX Referring Phys: NORMAN SERVE --------------------------------------------------------------------------------  Indications: Peripheral artery disease. High Risk Factors: Hypertension, hyperlipidemia, Diabetes, no history of                    smoking.  Vascular Interventions: Lithotripsy right peroneal artery and right TPT on                         08/11/23. I vessel run off with right peroneal artrery.                         Severe pedal disease. Current ABI:            Right 1.17 Performing  Technologist: Alan Greenhouse RDMS, RVT, RDCS  Examination Guidelines: A complete evaluation includes B-mode imaging, spectral  Doppler, color Doppler, and power Doppler as needed of all accessible portions of each vessel. Bilateral testing is considered an integral part of a complete examination. Limited examinations for reoccurring indications may be performed as noted.  +-----------+--------+-----+---------------+----------+--------+ RIGHT      PSV cm/sRatioStenosis       Waveform  Comments +-----------+--------+-----+---------------+----------+--------+ CFA Distal 58                          biphasic           +-----------+--------+-----+---------------+----------+--------+ DFA        70                          biphasic           +-----------+--------+-----+---------------+----------+--------+ SFA Prox   65                          biphasic           +-----------+--------+-----+---------------+----------+--------+ SFA Mid    71                          biphasic           +-----------+--------+-----+---------------+----------+--------+ SFA Distal 67                          biphasic           +-----------+--------+-----+---------------+----------+--------+ POP Prox   60                          biphasic           +-----------+--------+-----+---------------+----------+--------+ POP Distal 49                          biphasic           +-----------+--------+-----+---------------+----------+--------+ TP Trunk   65                          biphasic           +-----------+--------+-----+---------------+----------+--------+ ATA Prox   64                          biphasic           +-----------+--------+-----+---------------+----------+--------+ ATA Mid    42                          monophasic         +-----------+--------+-----+---------------+----------+--------+ ATA Distal              occluded                           +-----------+--------+-----+---------------+----------+--------+ PTA Prox   40                          biphasic           +-----------+--------+-----+---------------+----------+--------+ PTA Mid                 occluded                          +-----------+--------+-----+---------------+----------+--------+  PTA Distal              occluded                          +-----------+--------+-----+---------------+----------+--------+ PERO Prox  36                          biphasic           +-----------+--------+-----+---------------+----------+--------+ PERO Mid   215          50-74% stenosisbiphasic           +-----------+--------+-----+---------------+----------+--------+ PERO Distal106                         biphasic           +-----------+--------+-----+---------------+----------+--------+ A focal velocity elevation of 215 cm/s was obtained at mid peroneal artery with a VR of 2.2. Findings are characteristic of 50-74% stenosis.  Summary: Right: Total occlusion noted in the anterior tibial artery. Total occlusion noted in the posterior tibial artery. 50-74% stenosis noted in the peroneal artery. Heavy calcification throughout right leg with focal stenosis seen in the mid right peroneal artery.  See table(s) above for measurements and observations.    Preliminary    No images are attached to the encounter.  Labs: Lab Results  Component Value Date   HGBA1C 6.2 (H) 08/07/2023   HGBA1C 7.2 (H) 05/13/2018   HGBA1C (H) 08/14/2007    6.3 (NOTE)   The ADA recommends the following therapeutic goal for glycemic   control related to Hgb A1C measurement:   Goal of Therapy:   < 7.0% Hgb A1C   Reference: American Diabetes Association: Clinical Practice   Recommendations 2008, Diabetes Care,  2008, 31:(Suppl 1).   REPTSTATUS 08/12/2023 FINAL 08/07/2023   CULT  08/07/2023    NO GROWTH 5 DAYS Performed at [facility], [address] [place], [place] [address]      Lab  Results  Component Value Date   ALBUMIN 3.2 (L) 08/12/2023   ALBUMIN 4.1 07/02/2012    No results found for: MG No results found for: VD25OH  No results found for: PREALBUMIN    Latest Ref Rng & Units 08/17/2023    4:19 AM 08/16/2023    2:09 AM 08/15/2023    3:32 AM  CBC EXTENDED  WBC 4.0 - 10.5 K/uL 5.0  6.4  6.9   RBC 4.22 - 5.81 MIL/uL 3.38  3.26  3.44   Hemoglobin 13.0 - 17.0 g/dL 89.9  9.7  89.9   HCT 60.9 - 52.0 % 32.7  28.8  30.7   Platelets 150 - 400 K/uL 83  116  125      There is no height or weight on file to calculate BMI.  Orders:  No orders of the defined types were placed in this encounter.  No orders of the defined types were placed in this encounter.    Procedures: No procedures performed  Clinical Data: No additional findings.  ROS:  All other systems negative, except as noted in the HPI. Review of Systems  Objective: Vital Signs: There were no vitals taken for this visit.  Specialty Comments:  No specialty comments available.  PMFS History: Patient Active Problem List   Diagnosis Date Noted   Osteomyelitis (neonatal) of jaw (suppurative) 08/07/2023   Osteomyelitis of fifth toe of right foot (HCC) 08/07/2023   Loss of weight 12/15/2018  Change in bowel habits 12/15/2018   PAD (peripheral artery disease) (HCC) 04/20/2012   Occlusion and stenosis of carotid artery without mention of cerebral infarction 04/20/2012   Depression 02/11/2012   Diabetes type 2 with atherosclerosis of arteries of extremities (HCC) 02/11/2012   HTN (hypertension) 03/19/2011   Past Medical History:  Diagnosis Date   Dementia (HCC)    Depression    Diabetes mellitus type II    Hypercholesteremia    Hypertension    S/P BKA (below knee amputation) unilateral (HCC)    Stroke (HCC) 2013   left sided weakness   TIA (transient ischemic attack)     Family History  Problem Relation Age of Onset   Heart disease Mother    Diabetes Father    Alcohol  abuse  Brother    Diabetes Brother    Diabetes Sister    Peripheral vascular disease Sister        amputation   Diabetes Daughter    Anxiety disorder Neg Hx    Bipolar disorder Neg Hx    Dementia Neg Hx    Depression Neg Hx    Drug abuse Neg Hx    Paranoid behavior Neg Hx    Schizophrenia Neg Hx    OCD Neg Hx    Seizures Neg Hx    Sexual abuse Neg Hx    Physical abuse Neg Hx    ADD / ADHD Neg Hx    Colon cancer Neg Hx    Colon polyps Neg Hx     Past Surgical History:  Procedure Laterality Date   ABDOMINAL AORTAGRAM N/A 04/29/2012   Procedure: ABDOMINAL AORTAGRAM;  Surgeon: Gaile LELON New, MD;  Location: [facility] CATH LAB;  Service: Cardiovascular;  Laterality: N/A;   ABDOMINAL AORTOGRAM W/LOWER EXTREMITY N/A 08/11/2023   Procedure: ABDOMINAL AORTOGRAM W/LOWER EXTREMITY;  Surgeon: Pearline Norman RAMAN, MD;  Location: [facility] INVASIVE CV LAB;  Service: Cardiovascular;  Laterality: N/A;   AMPUTATION Right 05/15/2018   Procedure: AMPUTATION RIGHT THIRD TOE;  Surgeon: Blinda Katz, DPM;  Location: [facility] ORS;  Service: Podiatry;  Laterality: Right;   AMPUTATION Right 08/13/2023   Procedure: AMPUTATION, FOOT, RAY;  Surgeon: Harden Jerona GAILS, MD;  Location: [facility] OR;  Service: Orthopedics;  Laterality: Right;  RIGHT  FOOT 5TH RAY AMPUTATION   CATARACT EXTRACTION W/PHACO Left 12/04/2015   Procedure: CATARACT EXTRACTION PHACO AND INTRAOCULAR LENS PLACEMENT LEFT EYE CDE=19.92;  Surgeon: Cherene Mania, MD;  Location: [facility] ORS;  Service: Ophthalmology;  Laterality: Left;  left   cataract removed Bilateral    left leg amputa     BKA   left leg amputated     LOWER EXTREMITY INTERVENTION  08/11/2023   Procedure: LOWER EXTREMITY INTERVENTION;  Surgeon: Pearline Norman RAMAN, MD;  Location: [facility] INVASIVE CV LAB;  Service: Cardiovascular;;   SHOULDER SURGERY Right    rotator cuff   Social History   Occupational History   Not on file  Tobacco Use   Smoking status: Never    Passive exposure: Never   Smokeless tobacco: Never   Vaping Use   Vaping status: Never Used  Substance and Sexual Activity   Alcohol  use: No    Comment: in the past but not since amputation in 2009, drank alcohol  on weekends    Drug use: No   Sexual activity: Not Currently

## 2023-09-19 NOTE — Progress Notes (Signed)
 Office Note   History of Present Illness   Chad Grant is a 73 y.o. (1950-08-16) male who presents for post agio visit.  He recently underwent right lower extremity angiogram with intravascular lithotripsy of the TP trunk and peroneal artery on 08/11/2023 by Dr. Pearline.  He was considered maximally revascularized after this procedure with one-vessel runoff via peroneal artery and severe pedal disease.  He subsequently underwent amputation of the right 4th and 5th toes on 08/13/2023 by Dr. Harden.  He returns today for follow up. He has no complaints at today's visit. He denies any rest pain or new tissue loss. His amputation sites on the right foot are healing well without signs of infection.  Current Outpatient Medications  Medication Sig Dispense Refill   acetaminophen  (TYLENOL ) 500 MG tablet Take 500 mg by mouth every 6 (six) hours as needed for mild pain (pain score 1-3).     aspirin  EC 81 MG tablet Take 1 tablet (81 mg total) by mouth daily. Swallow whole.     clopidogrel  (PLAVIX ) 75 MG tablet Take 1 tablet (75 mg total) by mouth daily.     FLUoxetine  (PROZAC ) 20 MG capsule Take 1 capsule (20 mg total) by mouth at bedtime.     gabapentin  (NEURONTIN ) 100 MG capsule Take 100 mg by mouth at bedtime.     glipiZIDE (GLUCOTROL) 5 MG tablet Take 5 mg by mouth daily.     loperamide (IMODIUM) 2 MG capsule Take 2 mg by mouth as needed for diarrhea or loose stools.     Melatonin 5 MG CAPS Take 10 mg by mouth at bedtime as needed (sleep).     metoprolol  tartrate (LOPRESSOR ) 25 MG tablet Take 25 mg by mouth daily.     pantoprazole  (PROTONIX ) 20 MG tablet Take 1 tablet by mouth daily as needed for heartburn or indigestion.     rosuvastatin  (CRESTOR ) 5 MG tablet Take 5 mg by mouth every other day.     No current facility-administered medications for this visit.    REVIEW OF SYSTEMS (negative unless checked):   Cardiac:  []  Chest pain or chest pressure? []  Shortness of breath upon  activity? []  Shortness of breath when lying flat? []  Irregular heart rhythm?  Vascular:  []  Pain in calf, thigh, or hip brought on by walking? []  Pain in feet at night that wakes you up from your sleep? []  Blood clot in your veins? []  Leg swelling?  Pulmonary:  []  Oxygen at home? []  Productive cough? []  Wheezing?  Neurologic:  []  Sudden weakness in arms or legs? []  Sudden numbness in arms or legs? []  Sudden onset of difficult speaking or slurred speech? []  Temporary loss of vision in one eye? []  Problems with dizziness?  Gastrointestinal:  []  Blood in stool? []  Vomited blood?  Genitourinary:  []  Burning when urinating? []  Blood in urine?  Psychiatric:  []  Major depression  Hematologic:  []  Bleeding problems? []  Problems with blood clotting?  Dermatologic:  []  Rashes or ulcers?  Constitutional:  []  Fever or chills?  Ear/Nose/Throat:  []  Change in hearing? []  Nose bleeds? []  Sore throat?  Musculoskeletal:  []  Back pain? []  Joint pain? []  Muscle pain?   Physical Examination   Vitals:   09/19/23 1211  BP: (!) 99/55  Pulse: 62  Temp: 97.9 F (36.6 C)  TempSrc: Temporal  Weight: 151 lb (68.5 kg)   Body mass index is 21.98 kg/m.  General:  WDWN in NAD; vital signs documented above Gait:  Not observed HENT: WNL, normocephalic Pulmonary: normal non-labored breathing , without rales, rhonchi,  wheezing Cardiac: regular Abdomen: soft, NT, no masses Skin: without rashes Vascular Exam/Pulses: brisk right peroneal doppler signal Extremities: well healed left BKA. Appropriately healing right 4th and 5th toe amputation without necrotic tissue or signs of infection Musculoskeletal: no muscle wasting or atrophy  Neurologic: A&O X 3;  No focal weakness or paresthesias are detected Psychiatric:  The pt has Normal affect.  Non-Invasive Vascular imaging   ABI (09/19/2023) +---------+------------------+-----+-------------------+--------+  Right   Rt  Pressure (mmHg)IndexWaveform           Comment   +---------+------------------+-----+-------------------+--------+  Brachial 118                                                 +---------+------------------+-----+-------------------+--------+  ATA     146               1.17 monophasic                   +---------+------------------+-----+-------------------+--------+  PTA     135               1.08 dampened monophasic          +---------+------------------+-----+-------------------+--------+  PERO    118               0.94 biphasic                     +---------+------------------+-----+-------------------+--------+  Great Toe32                0.26 Abnormal                     +---------+------------------+-----+-------------------+--------+   +--------+------------------+-----+--------+-------+  Left   Lt Pressure (mmHg)IndexWaveformComment  +--------+------------------+-----+--------+-------+  [id number]                                    +--------+------------------+-----+--------+-------+   +-------+-----------+-----------+------------+------------+  ABI/TBIToday's ABIToday's TBIPrevious ABIPrevious TBI  +-------+-----------+-----------+------------+------------+  Right 1.17       0.26       1.12                      +-------+-----------+-----------+------------+------------+     RLE Arterial Duplex (09/19/2023) +-----------+--------+-----+---------------+----------+--------+  RIGHT     PSV cm/sRatioStenosis       Waveform  Comments  +-----------+--------+-----+---------------+----------+--------+  CFA Distal 58                          biphasic            +-----------+--------+-----+---------------+----------+--------+  DFA       70                          biphasic            +-----------+--------+-----+---------------+----------+--------+  SFA Prox   65                          biphasic             +-----------+--------+-----+---------------+----------+--------+  SFA Mid    71  biphasic            +-----------+--------+-----+---------------+----------+--------+  SFA Distal 67                          biphasic            +-----------+--------+-----+---------------+----------+--------+  POP Prox   60                          biphasic            +-----------+--------+-----+---------------+----------+--------+  POP Distal 49                          biphasic            +-----------+--------+-----+---------------+----------+--------+  TP Trunk   65                          biphasic            +-----------+--------+-----+---------------+----------+--------+  ATA Prox   64                          biphasic            +-----------+--------+-----+---------------+----------+--------+  ATA Mid    42                          monophasic          +-----------+--------+-----+---------------+----------+--------+  ATA Distal              occluded                           +-----------+--------+-----+---------------+----------+--------+  PTA Prox   40                          biphasic            +-----------+--------+-----+---------------+----------+--------+  PTA Mid                 occluded                           +-----------+--------+-----+---------------+----------+--------+  PTA Distal              occluded                           +-----------+--------+-----+---------------+----------+--------+  PERO Prox  36                          biphasic            +-----------+--------+-----+---------------+----------+--------+  PERO Mid   215          50-74% stenosisbiphasic            +-----------+--------+-----+---------------+----------+--------+  PERO Distal106                         biphasic            +-----------+--------+-----+---------------+----------+--------+    Medical Decision Making   Gerald Kuehl is a 73 y.o. male who presents for post angio follow up  The patient recently underwent right lower extremity revascularization for critical limb ischemia with  tissue loss. He was considered maximally revascularized after the angiogram with one vessel peroneal runoff. He subsequently under went right 4th and 5th toe amputation by Dr.Duda His ABI on the right appears slightly improved at 1.17. He has biphasic peroneal waveforms Arterial duplex demonstrates patent arterial flow in the RLE without hemodynamically significant stenosis. There is 50% stenosis at the mid peroneal artery with a PSV of 215 cm/s He denies any rest pain or worsening tissue loss. His amputations on the right foot are healing well. He has a brisk right peroneal doppler signal Given that his amputation site is healing, he does not require any further vascular intervention or more proximal amputation. He can follow up with our office in 6 months with RLE arterial duplex and ABIs   Ahmed Holster PA-C [facility] Office: [phone]  Clinic MD: Pearline

## 2023-09-20 LAB — VAS US ABI WITH/WO TBI: Right ABI: 1.17

## 2023-09-22 DIAGNOSIS — I10 Essential (primary) hypertension: Secondary | ICD-10-CM | POA: Diagnosis not present

## 2023-09-22 DIAGNOSIS — I739 Peripheral vascular disease, unspecified: Secondary | ICD-10-CM | POA: Diagnosis not present

## 2023-09-22 DIAGNOSIS — Z89421 Acquired absence of other right toe(s): Secondary | ICD-10-CM | POA: Diagnosis not present

## 2023-09-22 DIAGNOSIS — Z4781 Encounter for orthopedic aftercare following surgical amputation: Secondary | ICD-10-CM | POA: Diagnosis not present

## 2023-09-25 ENCOUNTER — Other Ambulatory Visit: Payer: Self-pay | Admitting: *Deleted

## 2023-09-25 DIAGNOSIS — I739 Peripheral vascular disease, unspecified: Secondary | ICD-10-CM

## 2023-09-26 DIAGNOSIS — I739 Peripheral vascular disease, unspecified: Secondary | ICD-10-CM | POA: Diagnosis not present

## 2023-09-26 DIAGNOSIS — I10 Essential (primary) hypertension: Secondary | ICD-10-CM | POA: Diagnosis not present

## 2023-09-26 DIAGNOSIS — D649 Anemia, unspecified: Secondary | ICD-10-CM | POA: Diagnosis not present

## 2023-09-26 DIAGNOSIS — Z89421 Acquired absence of other right toe(s): Secondary | ICD-10-CM | POA: Diagnosis not present

## 2023-09-29 DIAGNOSIS — Z89421 Acquired absence of other right toe(s): Secondary | ICD-10-CM | POA: Diagnosis not present

## 2023-09-29 DIAGNOSIS — I739 Peripheral vascular disease, unspecified: Secondary | ICD-10-CM | POA: Diagnosis not present

## 2023-09-29 DIAGNOSIS — D649 Anemia, unspecified: Secondary | ICD-10-CM | POA: Diagnosis not present

## 2023-09-29 DIAGNOSIS — I10 Essential (primary) hypertension: Secondary | ICD-10-CM | POA: Diagnosis not present

## 2023-10-03 DIAGNOSIS — D649 Anemia, unspecified: Secondary | ICD-10-CM | POA: Diagnosis not present

## 2023-10-03 DIAGNOSIS — F0150 Vascular dementia without behavioral disturbance: Secondary | ICD-10-CM | POA: Diagnosis not present

## 2023-10-03 DIAGNOSIS — F339 Major depressive disorder, recurrent, unspecified: Secondary | ICD-10-CM | POA: Diagnosis not present

## 2023-10-03 DIAGNOSIS — E785 Hyperlipidemia, unspecified: Secondary | ICD-10-CM | POA: Diagnosis not present

## 2023-10-03 DIAGNOSIS — I739 Peripheral vascular disease, unspecified: Secondary | ICD-10-CM | POA: Diagnosis not present

## 2023-10-03 DIAGNOSIS — I10 Essential (primary) hypertension: Secondary | ICD-10-CM | POA: Diagnosis not present

## 2023-10-03 DIAGNOSIS — Z89421 Acquired absence of other right toe(s): Secondary | ICD-10-CM | POA: Diagnosis not present

## 2023-10-16 DIAGNOSIS — E1151 Type 2 diabetes mellitus with diabetic peripheral angiopathy without gangrene: Secondary | ICD-10-CM | POA: Diagnosis not present

## 2023-10-16 DIAGNOSIS — I69354 Hemiplegia and hemiparesis following cerebral infarction affecting left non-dominant side: Secondary | ICD-10-CM | POA: Diagnosis not present

## 2023-10-20 ENCOUNTER — Ambulatory Visit (INDEPENDENT_AMBULATORY_CARE_PROVIDER_SITE_OTHER): Admitting: Orthopedic Surgery

## 2023-10-20 DIAGNOSIS — Z89421 Acquired absence of other right toe(s): Secondary | ICD-10-CM

## 2023-10-21 ENCOUNTER — Encounter: Payer: Self-pay | Admitting: Orthopedic Surgery

## 2023-10-21 NOTE — Progress Notes (Signed)
 Office Visit Note   Patient: Chad Grant           Date of Birth: Dec 20, 1950           MRN: [id number] Visit Date: 10/20/2023              Requested by: Maree Isles, MD [address] [place],  [place] [address] PCP: Maree Isles, MD  Chief Complaint  Patient presents with   Left Foot - Routine Post Op    08/13/2023 right 4th and 5th ray amputation       HPI: Discussed the use of AI scribe software for clinical note transcription with the patient, who gave verbal consent to proceed.  History of Present Illness Chad Grant is a 73 year old male who presents for follow-up of a healed wound on the right foot.  He has a stable left transtibial amputation and was previously using a nitroglycerin patch for a wound on the right foot. The wound has healed, and he will discontinue the use of the nitroglycerin patch.     Assessment & Plan: Visit Diagnoses:  1. History of partial ray amputation of fifth toe of right foot     Plan: Assessment and Plan Assessment & Plan Healed right foot wound The wound healed with nitroglycerin patch. Skin calloused but stable. - Discontinue nitroglycerin patch. - Apply moisturizing lotion to soften calloused skin. - Continue wearing extra depth New Balance shoes for support.  Left transtibial amputation Amputation well-managed, no complications. Weight bearing tolerated in current footwear. - Continue current management and follow up as needed.      Follow-Up Instructions: Return if symptoms worsen or fail to improve.   Ortho Exam  Patient is alert, oriented, no adenopathy, well-dressed, normal affect, normal respiratory effort. Physical Exam MUSCULOSKELETAL: Stable left transtibular amputation, right foot wound well healed.      Imaging: No results found. No images are attached to the encounter.  Labs: Lab Results  Component Value Date   HGBA1C 6.2 (H) 08/07/2023   HGBA1C 7.2 (H) 05/13/2018   HGBA1C (H) 08/14/2007     6.3 (NOTE)   The ADA recommends the following therapeutic goal for glycemic   control related to Hgb A1C measurement:   Goal of Therapy:   < 7.0% Hgb A1C   Reference: American Diabetes Association: Clinical Practice   Recommendations 2008, Diabetes Care,  2008, 31:(Suppl 1).   REPTSTATUS 08/12/2023 FINAL 08/07/2023   CULT  08/07/2023    NO GROWTH 5 DAYS Performed at [facility], [address] [place], [place] [address]      Lab Results  Component Value Date   ALBUMIN 3.2 (L) 08/12/2023   ALBUMIN 4.1 07/02/2012    No results found for: MG No results found for: VD25OH  No results found for: PREALBUMIN    Latest Ref Rng & Units 08/17/2023    4:19 AM 08/16/2023    2:09 AM 08/15/2023    3:32 AM  CBC EXTENDED  WBC 4.0 - 10.5 K/uL 5.0  6.4  6.9   RBC 4.22 - 5.81 MIL/uL 3.38  3.26  3.44   Hemoglobin 13.0 - 17.0 g/dL 89.9  9.7  89.9   HCT 60.9 - 52.0 % 32.7  28.8  30.7   Platelets 150 - 400 K/uL 83  116  125      There is no height or weight on file to calculate BMI.  Orders:  No orders of the defined types were placed in this  encounter.  No orders of the defined types were placed in this encounter.    Procedures: No procedures performed  Clinical Data: No additional findings.  ROS:  All other systems negative, except as noted in the HPI. Review of Systems  Objective: Vital Signs: There were no vitals taken for this visit.  Specialty Comments:  No specialty comments available.  PMFS History: Patient Active Problem List   Diagnosis Date Noted   Osteomyelitis (neonatal) of jaw (suppurative) 08/07/2023   Osteomyelitis of fifth toe of right foot (HCC) 08/07/2023   Loss of weight 12/15/2018   Change in bowel habits 12/15/2018   PAD (peripheral artery disease) 04/20/2012   Occlusion and stenosis of carotid artery without mention of cerebral infarction 04/20/2012   Depression 02/11/2012   Diabetes type 2 with atherosclerosis of arteries of extremities (HCC)  02/11/2012   HTN (hypertension) 03/19/2011   Past Medical History:  Diagnosis Date   Dementia (HCC)    Depression    Diabetes mellitus type II    Hypercholesteremia    Hypertension    S/P BKA (below knee amputation) unilateral (HCC)    Stroke (HCC) 2013   left sided weakness   TIA (transient ischemic attack)     Family History  Problem Relation Age of Onset   Heart disease Mother    Diabetes Father    Alcohol  abuse Brother    Diabetes Brother    Diabetes Sister    Peripheral vascular disease Sister        amputation   Diabetes Daughter    Anxiety disorder Neg Hx    Bipolar disorder Neg Hx    Dementia Neg Hx    Depression Neg Hx    Drug abuse Neg Hx    Paranoid behavior Neg Hx    Schizophrenia Neg Hx    OCD Neg Hx    Seizures Neg Hx    Sexual abuse Neg Hx    Physical abuse Neg Hx    ADD / ADHD Neg Hx    Colon cancer Neg Hx    Colon polyps Neg Hx     Past Surgical History:  Procedure Laterality Date   ABDOMINAL AORTAGRAM N/A 04/29/2012   Procedure: ABDOMINAL AORTAGRAM;  Surgeon: Gaile LELON New, MD;  Location: [facility] CATH LAB;  Service: Cardiovascular;  Laterality: N/A;   ABDOMINAL AORTOGRAM W/LOWER EXTREMITY N/A 08/11/2023   Procedure: ABDOMINAL AORTOGRAM W/LOWER EXTREMITY;  Surgeon: Pearline Norman RAMAN, MD;  Location: [facility] INVASIVE CV LAB;  Service: Cardiovascular;  Laterality: N/A;   AMPUTATION Right 05/15/2018   Procedure: AMPUTATION RIGHT THIRD TOE;  Surgeon: Blinda Katz, DPM;  Location: [facility] ORS;  Service: Podiatry;  Laterality: Right;   AMPUTATION Right 08/13/2023   Procedure: AMPUTATION, FOOT, RAY;  Surgeon: Harden Jerona GAILS, MD;  Location: [facility] OR;  Service: Orthopedics;  Laterality: Right;  RIGHT  FOOT 5TH RAY AMPUTATION   CATARACT EXTRACTION W/PHACO Left 12/04/2015   Procedure: CATARACT EXTRACTION PHACO AND INTRAOCULAR LENS PLACEMENT LEFT EYE CDE=19.92;  Surgeon: Cherene Mania, MD;  Location: [facility] ORS;  Service: Ophthalmology;  Laterality: Left;  left   cataract removed  Bilateral    left leg amputa     BKA   left leg amputated     LOWER EXTREMITY INTERVENTION  08/11/2023   Procedure: LOWER EXTREMITY INTERVENTION;  Surgeon: Pearline Norman RAMAN, MD;  Location: [facility] INVASIVE CV LAB;  Service: Cardiovascular;;   SHOULDER SURGERY Right    rotator cuff   Social History   Occupational History  Not on file  Tobacco Use   Smoking status: Never    Passive exposure: Never   Smokeless tobacco: Never  Vaping Use   Vaping status: Never Used  Substance and Sexual Activity   Alcohol  use: No    Comment: in the past but not since amputation in 2009, drank alcohol  on weekends    Drug use: No   Sexual activity: Not Currently

## 2023-10-24 DIAGNOSIS — Z89421 Acquired absence of other right toe(s): Secondary | ICD-10-CM | POA: Diagnosis not present

## 2023-10-24 DIAGNOSIS — I69354 Hemiplegia and hemiparesis following cerebral infarction affecting left non-dominant side: Secondary | ICD-10-CM | POA: Diagnosis not present

## 2023-10-24 DIAGNOSIS — I70209 Unspecified atherosclerosis of native arteries of extremities, unspecified extremity: Secondary | ICD-10-CM | POA: Diagnosis not present

## 2023-10-24 DIAGNOSIS — I739 Peripheral vascular disease, unspecified: Secondary | ICD-10-CM | POA: Diagnosis not present

## 2023-10-24 DIAGNOSIS — F339 Major depressive disorder, recurrent, unspecified: Secondary | ICD-10-CM | POA: Diagnosis not present

## 2023-10-24 DIAGNOSIS — F0150 Vascular dementia without behavioral disturbance: Secondary | ICD-10-CM | POA: Diagnosis not present

## 2023-10-24 DIAGNOSIS — E785 Hyperlipidemia, unspecified: Secondary | ICD-10-CM | POA: Diagnosis not present

## 2023-10-24 DIAGNOSIS — D649 Anemia, unspecified: Secondary | ICD-10-CM | POA: Diagnosis not present

## 2023-10-31 DIAGNOSIS — Z89421 Acquired absence of other right toe(s): Secondary | ICD-10-CM | POA: Diagnosis not present

## 2023-10-31 DIAGNOSIS — D649 Anemia, unspecified: Secondary | ICD-10-CM | POA: Diagnosis not present

## 2023-10-31 DIAGNOSIS — E785 Hyperlipidemia, unspecified: Secondary | ICD-10-CM | POA: Diagnosis not present

## 2023-10-31 DIAGNOSIS — I10 Essential (primary) hypertension: Secondary | ICD-10-CM | POA: Diagnosis not present

## 2023-10-31 DIAGNOSIS — F339 Major depressive disorder, recurrent, unspecified: Secondary | ICD-10-CM | POA: Diagnosis not present

## 2023-11-24 ENCOUNTER — Encounter: Payer: Self-pay | Admitting: Radiology

## 2023-11-24 DIAGNOSIS — E559 Vitamin D deficiency, unspecified: Secondary | ICD-10-CM | POA: Diagnosis not present

## 2023-11-24 DIAGNOSIS — E119 Type 2 diabetes mellitus without complications: Secondary | ICD-10-CM | POA: Diagnosis not present

## 2024-01-21 ENCOUNTER — Telehealth: Payer: Self-pay

## 2024-01-21 NOTE — Telephone Encounter (Signed)
 Allean Carls, RN at [facility] called to report patient has new wounds to right heel and great toe.  Appts made for RT ABI, LE Art and Dr. Serene

## 2024-01-23 ENCOUNTER — Other Ambulatory Visit: Payer: Self-pay | Admitting: Vascular Surgery

## 2024-01-23 DIAGNOSIS — I739 Peripheral vascular disease, unspecified: Secondary | ICD-10-CM

## 2024-01-26 ENCOUNTER — Ambulatory Visit: Admitting: Surgery

## 2024-01-26 ENCOUNTER — Ambulatory Visit (HOSPITAL_COMMUNITY)
Admission: RE | Admit: 2024-01-26 | Discharge: 2024-01-26 | Disposition: A | Discharge status: Home / Self Care | Source: Ambulatory Visit | Attending: Physician Assistant | Admitting: Physician Assistant

## 2024-01-26 ENCOUNTER — Ambulatory Visit (HOSPITAL_COMMUNITY): Admission: RE | Admit: 2024-01-26 | Discharge: 2024-01-26 | Attending: Physician Assistant

## 2024-01-26 ENCOUNTER — Encounter: Payer: Self-pay | Admitting: Surgery

## 2024-01-26 ENCOUNTER — Other Ambulatory Visit: Payer: Self-pay

## 2024-01-26 VITALS — BP 128/73 | HR 76 | Temp 97.9°F

## 2024-01-26 DIAGNOSIS — I70238 Atherosclerosis of native arteries of right leg with ulceration of other part of lower right leg: Secondary | ICD-10-CM

## 2024-01-26 DIAGNOSIS — I739 Peripheral vascular disease, unspecified: Secondary | ICD-10-CM

## 2024-01-26 LAB — VAS US ABI WITH/WO TBI: Right ABI: 0.95

## 2024-01-26 NOTE — H&P (View-Only) (Signed)
 "                                    [facility] [place]  Patient name: Chad Grant MRN: [id number] DOB: 1950/10/07 Sex: male   REASON FOR VISIT:    Triage visit for right heel wound  HISOTRY OF PRESENT ILLNESS:    Chad Grant is a 74 y.o. male who is status post shockwave lithotripsy to the tibioperoneal trunk and peroneal artery on 08/11/2023 by Dr. Pearline for osteomyelitis of the right fifth toe.  He subsequently underwent right 4th and 5th toe amputation by Dr. Harden.  He has a history of left below-knee amputation.  He is sent over from his nursing facility because of a right heel wound  Patient does suffer from dementia.  He is a type II diabetic.  He is medically managed for hypertension.  He has a stroke with left-sided weakness.  He is on a statin for hypercholesterolemia.  He is on dual antiplatelet therapy   PAST MEDICAL HISTORY:   Past Medical History:  Diagnosis Date   Dementia (HCC)    Depression    Diabetes mellitus type II    Hypercholesteremia    Hypertension    S/P BKA (below knee amputation) unilateral (HCC)    Stroke (HCC) 2013   left sided weakness   TIA (transient ischemic attack)      FAMILY HISTORY:   Family History  Problem Relation Age of Onset   Heart disease Mother    Diabetes Father    Alcohol  abuse Brother    Diabetes Brother    Diabetes Sister    Peripheral vascular disease Sister        amputation   Diabetes Daughter    Anxiety disorder Neg Hx    Bipolar disorder Neg Hx    Dementia Neg Hx    Depression Neg Hx    Drug abuse Neg Hx    Paranoid behavior Neg Hx    Schizophrenia Neg Hx    OCD Neg Hx    Seizures Neg Hx    Sexual abuse Neg Hx    Physical abuse Neg Hx    ADD / ADHD Neg Hx    Colon cancer Neg Hx    Colon polyps Neg Hx     SOCIAL HISTORY:   Social History   Tobacco Use   Smoking status: Never    Passive exposure: Never   Smokeless tobacco: Never  Substance Use Topics   Alcohol   use: No    Comment: in the past but not since amputation in 2009, drank alcohol  on weekends      ALLERGIES:   Allergies[1]   CURRENT MEDICATIONS:   Current Outpatient Medications  Medication Sig Dispense Refill   acetaminophen  (TYLENOL ) 500 MG tablet Take 500 mg by mouth every 6 (six) hours as needed for mild pain (pain score 1-3).     aspirin  EC 81 MG tablet Take 1 tablet (81 mg total) by mouth daily. Swallow whole.     clopidogrel  (PLAVIX ) 75 MG tablet Take 1 tablet (75 mg total) by mouth daily.     FLUoxetine  (PROZAC ) 20 MG capsule Take 1 capsule (20 mg total) by mouth at bedtime.     gabapentin  (NEURONTIN ) 100 MG capsule Take 100 mg by mouth at bedtime.     glipiZIDE (GLUCOTROL) 5 MG tablet Take 5 mg by mouth daily.  loperamide (IMODIUM) 2 MG capsule Take 2 mg by mouth as needed for diarrhea or loose stools.     Melatonin 5 MG CAPS Take 10 mg by mouth at bedtime as needed (sleep).     metoprolol  tartrate (LOPRESSOR ) 25 MG tablet Take 25 mg by mouth daily.     pantoprazole  (PROTONIX ) 20 MG tablet Take 1 tablet by mouth daily as needed for heartburn or indigestion.     rosuvastatin  (CRESTOR ) 5 MG tablet Take 5 mg by mouth every other day.     No current facility-administered medications for this visit.    REVIEW OF SYSTEMS:   [X]  denotes positive finding, [ ]  denotes negative finding Cardiac  Comments:  Chest pain or chest pressure:    Shortness of breath upon exertion:    Short of breath when lying flat:    Irregular heart rhythm:        Vascular    Pain in calf, thigh, or hip brought on by ambulation:    Pain in feet at night that wakes you up from your sleep:     Blood clot in your veins:    Leg swelling:         Pulmonary    Oxygen at home:    Productive cough:     Wheezing:         Neurologic    Sudden weakness in arms or legs:     Sudden numbness in arms or legs:     Sudden onset of difficulty speaking or slurred speech:    Temporary loss of vision in  one eye:     Problems with dizziness:         Gastrointestinal    Blood in stool:     Vomited blood:         Genitourinary    Burning when urinating:     Blood in urine:        Psychiatric    Major depression:         Hematologic    Bleeding problems:    Problems with blood clotting too easily:        Skin    Rashes or ulcers:        Constitutional    Fever or chills:      PHYSICAL EXAM:   Vitals:   01/26/24 1511  BP: 128/73  Pulse: 76  Temp: 97.9 F (36.6 C)  SpO2: 91%    GENERAL: The patient is a well-nourished male, in no acute distress. The vital signs are documented above. CARDIAC: There is a regular rate and rhythm.  VASCULAR: Nonpalpable pedal pulse PULMONARY: Non-labored respirations ABDOMEN: Soft and non-tender with normal pitched bowel sounds.  MUSCULOSKELETAL: Left leg amputation NEUROLOGIC: No focal weakness or paresthesias are detected. PSYCHIATRIC: The patient has a normal affect.     STUDIES:   I have reviewed the following: Lower extremity: Right: 50-74% stenosis is seen in the peroneal artery. The calf vessels  are not well visualized. Multiphasic flow is seen from the groin to knee.  ABI/TBIToday's ABIToday's TBI     Previous ABIPrevious TBI  +-------+-----------+----------------+------------+------------+  Right 0.95       unable to obtain1.17        0.26          +-------+-----------+----------------+------------+------------+  Left  AKA        AKA             AKA         AKA           +-------+-----------+----------------+------------+------------+  MEDICAL ISSUES:   PAD with ulcer, right leg: The patient has a limb threatening situation which I discussed with him given his heel wound which is likely pressure related.  He also has issues with his 1st and 2nd toe.  On ultrasound today he has triphasic waveforms through the popliteal artery on the right however there is a stenosis in the peroneal artery which is his  dominant runoff.  The TP trunk is monophasic.  Given that this is a limb threatening situation, I think he needs to undergo repeat angiography to make sure that he is maximally revascularized.  I am getting this scheduled in the immediate future.  I am also referring him to see Dr. Harden for further management of the heel wound and possible further digital amputation.    Malvina Serene CLORE, MD, FACS [facility] [phone] Pager [phone]     [1] No Known Allergies  "

## 2024-01-26 NOTE — Progress Notes (Signed)
 "                                    [facility] [place]  Patient name: Chad Grant MRN: [id number] DOB: 1950/10/07 Sex: male   REASON FOR VISIT:    Triage visit for right heel wound  HISOTRY OF PRESENT ILLNESS:    Chad Grant is a 74 y.o. male who is status post shockwave lithotripsy to the tibioperoneal trunk and peroneal artery on 08/11/2023 by Dr. Pearline for osteomyelitis of the right fifth toe.  He subsequently underwent right 4th and 5th toe amputation by Dr. Harden.  He has a history of left below-knee amputation.  He is sent over from his nursing facility because of a right heel wound  Patient does suffer from dementia.  He is a type II diabetic.  He is medically managed for hypertension.  He has a stroke with left-sided weakness.  He is on a statin for hypercholesterolemia.  He is on dual antiplatelet therapy   PAST MEDICAL HISTORY:   Past Medical History:  Diagnosis Date   Dementia (HCC)    Depression    Diabetes mellitus type II    Hypercholesteremia    Hypertension    S/P BKA (below knee amputation) unilateral (HCC)    Stroke (HCC) 2013   left sided weakness   TIA (transient ischemic attack)      FAMILY HISTORY:   Family History  Problem Relation Age of Onset   Heart disease Mother    Diabetes Father    Alcohol  abuse Brother    Diabetes Brother    Diabetes Sister    Peripheral vascular disease Sister        amputation   Diabetes Daughter    Anxiety disorder Neg Hx    Bipolar disorder Neg Hx    Dementia Neg Hx    Depression Neg Hx    Drug abuse Neg Hx    Paranoid behavior Neg Hx    Schizophrenia Neg Hx    OCD Neg Hx    Seizures Neg Hx    Sexual abuse Neg Hx    Physical abuse Neg Hx    ADD / ADHD Neg Hx    Colon cancer Neg Hx    Colon polyps Neg Hx     SOCIAL HISTORY:   Social History   Tobacco Use   Smoking status: Never    Passive exposure: Never   Smokeless tobacco: Never  Substance Use Topics   Alcohol   use: No    Comment: in the past but not since amputation in 2009, drank alcohol  on weekends      ALLERGIES:   Allergies[1]   CURRENT MEDICATIONS:   Current Outpatient Medications  Medication Sig Dispense Refill   acetaminophen  (TYLENOL ) 500 MG tablet Take 500 mg by mouth every 6 (six) hours as needed for mild pain (pain score 1-3).     aspirin  EC 81 MG tablet Take 1 tablet (81 mg total) by mouth daily. Swallow whole.     clopidogrel  (PLAVIX ) 75 MG tablet Take 1 tablet (75 mg total) by mouth daily.     FLUoxetine  (PROZAC ) 20 MG capsule Take 1 capsule (20 mg total) by mouth at bedtime.     gabapentin  (NEURONTIN ) 100 MG capsule Take 100 mg by mouth at bedtime.     glipiZIDE (GLUCOTROL) 5 MG tablet Take 5 mg by mouth daily.  loperamide (IMODIUM) 2 MG capsule Take 2 mg by mouth as needed for diarrhea or loose stools.     Melatonin 5 MG CAPS Take 10 mg by mouth at bedtime as needed (sleep).     metoprolol  tartrate (LOPRESSOR ) 25 MG tablet Take 25 mg by mouth daily.     pantoprazole  (PROTONIX ) 20 MG tablet Take 1 tablet by mouth daily as needed for heartburn or indigestion.     rosuvastatin  (CRESTOR ) 5 MG tablet Take 5 mg by mouth every other day.     No current facility-administered medications for this visit.    REVIEW OF SYSTEMS:   [X]  denotes positive finding, [ ]  denotes negative finding Cardiac  Comments:  Chest pain or chest pressure:    Shortness of breath upon exertion:    Short of breath when lying flat:    Irregular heart rhythm:        Vascular    Pain in calf, thigh, or hip brought on by ambulation:    Pain in feet at night that wakes you up from your sleep:     Blood clot in your veins:    Leg swelling:         Pulmonary    Oxygen at home:    Productive cough:     Wheezing:         Neurologic    Sudden weakness in arms or legs:     Sudden numbness in arms or legs:     Sudden onset of difficulty speaking or slurred speech:    Temporary loss of vision in  one eye:     Problems with dizziness:         Gastrointestinal    Blood in stool:     Vomited blood:         Genitourinary    Burning when urinating:     Blood in urine:        Psychiatric    Major depression:         Hematologic    Bleeding problems:    Problems with blood clotting too easily:        Skin    Rashes or ulcers:        Constitutional    Fever or chills:      PHYSICAL EXAM:   Vitals:   01/26/24 1511  BP: 128/73  Pulse: 76  Temp: 97.9 F (36.6 C)  SpO2: 91%    GENERAL: The patient is a well-nourished male, in no acute distress. The vital signs are documented above. CARDIAC: There is a regular rate and rhythm.  VASCULAR: Nonpalpable pedal pulse PULMONARY: Non-labored respirations ABDOMEN: Soft and non-tender with normal pitched bowel sounds.  MUSCULOSKELETAL: Left leg amputation NEUROLOGIC: No focal weakness or paresthesias are detected. PSYCHIATRIC: The patient has a normal affect.     STUDIES:   I have reviewed the following: Lower extremity: Right: 50-74% stenosis is seen in the peroneal artery. The calf vessels  are not well visualized. Multiphasic flow is seen from the groin to knee.  ABI/TBIToday's ABIToday's TBI     Previous ABIPrevious TBI  +-------+-----------+----------------+------------+------------+  Right 0.95       unable to obtain1.17        0.26          +-------+-----------+----------------+------------+------------+  Left  AKA        AKA             AKA         AKA           +-------+-----------+----------------+------------+------------+  MEDICAL ISSUES:   PAD with ulcer, right leg: The patient has a limb threatening situation which I discussed with him given his heel wound which is likely pressure related.  He also has issues with his 1st and 2nd toe.  On ultrasound today he has triphasic waveforms through the popliteal artery on the right however there is a stenosis in the peroneal artery which is his  dominant runoff.  The TP trunk is monophasic.  Given that this is a limb threatening situation, I think he needs to undergo repeat angiography to make sure that he is maximally revascularized.  I am getting this scheduled in the immediate future.  I am also referring him to see Dr. Harden for further management of the heel wound and possible further digital amputation.    Malvina Serene CLORE, MD, FACS [facility] [phone] Pager [phone]     [1] No Known Allergies  "

## 2024-02-02 ENCOUNTER — Encounter (HOSPITAL_COMMUNITY)
Admission: RE | Disposition: A | Discharge status: Home / Self Care | Payer: Self-pay | Source: Home / Self Care | Attending: Vascular Surgery

## 2024-02-02 ENCOUNTER — Other Ambulatory Visit: Payer: Self-pay

## 2024-02-02 ENCOUNTER — Ambulatory Visit (HOSPITAL_COMMUNITY)
Admission: RE | Admit: 2024-02-02 | Discharge: 2024-02-02 | Disposition: A | Attending: Vascular Surgery | Admitting: Vascular Surgery

## 2024-02-02 ENCOUNTER — Telehealth: Payer: Self-pay

## 2024-02-02 DIAGNOSIS — Z7984 Long term (current) use of oral hypoglycemic drugs: Secondary | ICD-10-CM | POA: Insufficient documentation

## 2024-02-02 DIAGNOSIS — F32A Depression, unspecified: Secondary | ICD-10-CM | POA: Diagnosis not present

## 2024-02-02 DIAGNOSIS — I70234 Atherosclerosis of native arteries of right leg with ulceration of heel and midfoot: Secondary | ICD-10-CM | POA: Insufficient documentation

## 2024-02-02 DIAGNOSIS — Z7902 Long term (current) use of antithrombotics/antiplatelets: Secondary | ICD-10-CM | POA: Diagnosis not present

## 2024-02-02 DIAGNOSIS — L97419 Non-pressure chronic ulcer of right heel and midfoot with unspecified severity: Secondary | ICD-10-CM | POA: Insufficient documentation

## 2024-02-02 DIAGNOSIS — E7800 Pure hypercholesterolemia, unspecified: Secondary | ICD-10-CM | POA: Insufficient documentation

## 2024-02-02 DIAGNOSIS — I70291 Other atherosclerosis of native arteries of extremities, right leg: Secondary | ICD-10-CM | POA: Diagnosis not present

## 2024-02-02 DIAGNOSIS — Z89512 Acquired absence of left leg below knee: Secondary | ICD-10-CM | POA: Diagnosis not present

## 2024-02-02 DIAGNOSIS — I70221 Atherosclerosis of native arteries of extremities with rest pain, right leg: Secondary | ICD-10-CM

## 2024-02-02 DIAGNOSIS — F0393 Unspecified dementia, unspecified severity, with mood disturbance: Secondary | ICD-10-CM | POA: Insufficient documentation

## 2024-02-02 DIAGNOSIS — E1151 Type 2 diabetes mellitus with diabetic peripheral angiopathy without gangrene: Secondary | ICD-10-CM | POA: Diagnosis present

## 2024-02-02 DIAGNOSIS — E11621 Type 2 diabetes mellitus with foot ulcer: Secondary | ICD-10-CM | POA: Insufficient documentation

## 2024-02-02 DIAGNOSIS — Z79899 Other long term (current) drug therapy: Secondary | ICD-10-CM | POA: Diagnosis not present

## 2024-02-02 DIAGNOSIS — S91301A Unspecified open wound, right foot, initial encounter: Secondary | ICD-10-CM

## 2024-02-02 DIAGNOSIS — I70238 Atherosclerosis of native arteries of right leg with ulceration of other part of lower right leg: Secondary | ICD-10-CM

## 2024-02-02 DIAGNOSIS — I70229 Atherosclerosis of native arteries of extremities with rest pain, unspecified extremity: Secondary | ICD-10-CM

## 2024-02-02 DIAGNOSIS — Z7982 Long term (current) use of aspirin: Secondary | ICD-10-CM | POA: Diagnosis not present

## 2024-02-02 DIAGNOSIS — I10 Essential (primary) hypertension: Secondary | ICD-10-CM | POA: Insufficient documentation

## 2024-02-02 DIAGNOSIS — G8194 Hemiplegia, unspecified affecting left nondominant side: Secondary | ICD-10-CM | POA: Diagnosis not present

## 2024-02-02 HISTORY — PX: LOWER EXTREMITY INTERVENTION: CATH118252

## 2024-02-02 HISTORY — PX: ABDOMINAL AORTOGRAM W/LOWER EXTREMITY: CATH118223

## 2024-02-02 LAB — GLUCOSE, CAPILLARY: Glucose-Capillary: 169 mg/dL — ABNORMAL HIGH (ref 70–99)

## 2024-02-02 MED ORDER — HEPARIN (PORCINE) IN NACL 1000-0.9 UT/500ML-% IV SOLN
INTRAVENOUS | Status: DC | PRN
  Administered 2024-02-02: 1000 mL via SURGICAL_CAVITY

## 2024-02-02 MED ORDER — HEPARIN SODIUM (PORCINE) 1000 UNIT/ML IJ SOLN
INTRAMUSCULAR | Status: DC | PRN
  Administered 2024-02-02: 6000 [IU] via INTRAVENOUS

## 2024-02-02 MED ORDER — HEPARIN SODIUM (PORCINE) 1000 UNIT/ML IJ SOLN
INTRAMUSCULAR | Status: AC
  Filled 2024-02-02: qty 10

## 2024-02-02 MED ORDER — LIDOCAINE HCL (PF) 1 % IJ SOLN
INTRAMUSCULAR | Status: DC | PRN
  Administered 2024-02-02: 10 mL

## 2024-02-02 MED ORDER — ONDANSETRON HCL 4 MG/2ML IJ SOLN: 4.0000 mg | Freq: Four times a day (QID) | INTRAMUSCULAR | Status: DC | PRN

## 2024-02-02 MED ORDER — IODIXANOL 320 MG/ML IV SOLN
INTRAVENOUS | Status: DC | PRN
  Administered 2024-02-02: 50 mL via INTRA_ARTERIAL

## 2024-02-02 MED ORDER — LIDOCAINE HCL (PF) 1 % IJ SOLN
INTRAMUSCULAR | Status: AC
  Filled 2024-02-02: qty 30

## 2024-02-02 MED ORDER — SODIUM CHLORIDE 0.9% FLUSH: 3.0000 mL | Freq: Two times a day (BID) | INTRAVENOUS | Status: DC

## 2024-02-02 MED ORDER — LABETALOL HCL 5 MG/ML IV SOLN: 10.0000 mg | INTRAVENOUS | Status: DC | PRN

## 2024-02-02 MED ORDER — ACETAMINOPHEN 325 MG PO TABS: 650.0000 mg | ORAL_TABLET | ORAL | Status: DC | PRN

## 2024-02-02 MED ORDER — HYDRALAZINE HCL 20 MG/ML IJ SOLN: 5.0000 mg | INTRAMUSCULAR | Status: DC | PRN

## 2024-02-02 MED ORDER — SODIUM CHLORIDE 0.9 % IV SOLN: 250.0000 mL | INTRAVENOUS | Status: DC | PRN

## 2024-02-02 MED ORDER — SODIUM CHLORIDE 0.9% FLUSH: 3.0000 mL | INTRAVENOUS | Status: DC | PRN

## 2024-02-02 MED ORDER — SODIUM CHLORIDE 0.9 % IV SOLN: INTRAVENOUS | Status: DC

## 2024-02-02 MED ORDER — SODIUM CHLORIDE 0.9 % WEIGHT BASED INFUSION: 1.0000 mL/kg/h | INTRAVENOUS | Status: DC

## 2024-02-02 NOTE — Telephone Encounter (Signed)
 Pt having procedure with vascular today. Will hold message and call to make an appt for follow up with Dr. Harden.

## 2024-02-02 NOTE — Progress Notes (Signed)
 Patient assisted with dressing for discharge and transferred to wheelchair. No bleeding or hematoma noted to left groin site. [facility] and was transferred to the nurses station with no answer. Notified the drivers that the nurse could call to the unit for report.

## 2024-02-02 NOTE — Telephone Encounter (Signed)
 I called and sw pt's wife to advise and appt sch for Thursday with Dr. Harden at 3:15. I also called the [facility] [phone] and sw Amy in scheduling and confirmed this appt as well.

## 2024-02-02 NOTE — Discharge Instructions (Signed)
 Femoral Site Care This sheet gives you information about how to care for yourself after your procedure. Your health care provider may also give you more specific instructions. If you have problems or questions, contact your health care provider. What can I expect after the procedure?  After the procedure, it is common to have: Bruising that usually fades within 1-2 weeks. Tenderness at the site. Follow these instructions at home: Wound care Follow instructions from your health care provider about how to take care of your insertion site. Make sure you: Wash your hands with soap and water  before you change your bandage (dressing). If soap and water  are not available, use hand sanitizer. Remove your dressing as told by your health care provider. 24 hours Do not take baths, swim, or use a hot tub until your health care provider approves. You may shower 24-48 hours after the procedure or as told by your health care provider. Gently wash the site with plain soap and water . Pat the area dry with a clean towel. Do not rub the site. This may cause bleeding. Do not apply powder or lotion to the site. Keep the site clean and dry. Check your femoral site every day for signs of infection. Check for: Redness, swelling, or pain. Fluid or blood. Warmth. Pus or a bad smell. Activity For the first 2-3 days after your procedure, or as long as directed: Avoid climbing stairs as much as possible. Do not squat. Do not lift anything that is heavier than 10 lb (4.5 kg), or the limit that you are told, until your health care provider says that it is safe. For 5 days Rest as directed. Avoid sitting for a long time without moving. Get up to take short walks every 1-2 hours. Do not drive for 24 hours if you were given a medicine to help you relax (sedative). General instructions Take over-the-counter and prescription medicines only as told by your health care provider. Keep all follow-up visits as told by your  health care provider. This is important. Contact a health care provider if you have: A fever or chills. You have redness, swelling, or pain around your insertion site. Get help right away if: The catheter insertion area swells very fast. You pass out. You suddenly start to sweat or your skin gets clammy. The catheter insertion area is bleeding, and the bleeding does not stop when you hold steady pressure on the area. The area near or just beyond the catheter insertion site becomes pale, cool, tingly, or numb. These symptoms may represent a serious problem that is an emergency. Do not wait to see if the symptoms will go away. Get medical help right away. Call your local emergency services (911 in the [place] Do not drive yourself to the hospital. Summary After the procedure, it is common to have bruising that usually fades within 1-2 weeks. Check your femoral site every day for signs of infection. Do not lift anything that is heavier than 10 lb (4.5 kg), or the limit that you are told, until your health care provider says that it is safe. This information is not intended to replace advice given to you by your health care provider. Make sure you discuss any questions you have with your health care provider. Document Revised: 01/20/2017 Document Reviewed: 01/20/2017 Elsevier Patient Education  2020 [employer].

## 2024-02-02 NOTE — Op Note (Signed)
" ° ° °  Patient name: Chad Grant MRN: [id number] DOB: 08-Feb-1950 Sex: male  02/02/2024 Pre-operative Diagnosis: CL TI with tissue loss, right foot wounds Post-operative diagnosis:  Same Surgeon:  Norman GORMAN Serve, MD Procedure Performed:  Ultrasound-guided access of left common femoral artery Aortogram and right lower extremity angiogram Third order cannulation of right peroneal Intravascular lithotripsy of right TP trunk and popliteal artery, 5 mm E8 shockwave Mynx closure of left common femoral artery  Indications: Mr. Chad Grant is a 74 year old male with PAD and CL TI.  He underwent angiogram with TP trunk and peroneal shockwave lithotripsy and balloon angioplasty in July.  He then presented to the office and was noted to have new foot wounds of the right great toe and healed.  Repeat angiogram was offered, risks and benefits were reviewed and they elected to proceed.  Findings:  Widely patent aorta and bilateral renal arteries. Widely patent iliac systems bilaterally.  Widely patent right common femoral, SFA and profunda with some medial calcinosis but no flow-limiting stenosis.  The right popliteal artery is patent without flow-limiting stenosis.  There is an exophytic calcific plaque in the TP trunk and approximately 70% stenosis.  The AT and PT are chronically occluded.  There is one-vessel runoff to the foot via the peroneal.  There is severe pedal disease   Procedure:  The patient was identified in the holding area and taken to the cath lab  The patient was then placed supine on the table and prepped and draped in the usual sterile fashion.  A time out was called.  Ultrasound was used to evaluate the left common femoral artery.  It was patent .  A digital ultrasound image was acquired.  A micropuncture needle was used to access the left common femoral artery under ultrasound guidance.  An 018 wire was advanced without resistance and a micropuncture sheath was placed.  The 018 wire was  removed and a benson wire was placed.  The micropuncture sheath was exchanged for a 5 french sheath.  An omniflush catheter was advanced over the wire to the level of L-1.  An abdominal angiogram was obtained.  Next, using the omniflush catheter and Bentson wire, the aortic bifurcation was crossed and the catheter was placed into theright external iliac artery and right runoff was obtained. This demonstrated the above findings.  A glide advantage wire was then placed through the catheter and into the right SFA.  The short 5 French sheath was then exchanged for a 6 French by 40 cm catapult sheath and patient was systemically heparinized.  Using a glide advantage wire and a quick cross catheter the TP trunk lesion was crossed and the wire and catheter were placed into the peroneal.  The glide of manage wire was then exchanged for an 014 command wire.  The TP trunk lesion and BK pop were treated with a 5 mm E8 shockwave balloon for total of 20 rounds and 400 pulses.  A completion angiogram demonstrated a less than 20% residual stenosis and preserved runoff through the peroneal.  The long 6 French sheath was then exchanged for a short 6 French sheath and a minx was device was deployed for hemostasis.  Contrast: 50 cc Sedation: None  Impression: Maximally revascularized with inline flow via the peroneal.  There is severe pedal disease.   Norman GORMAN Serve MD [facility] Office: [phone]  "

## 2024-02-02 NOTE — Interval H&P Note (Signed)
 History and Physical Interval Note:  02/02/2024 11:56 AM  Chad Grant  has presented today for surgery, with the diagnosis of Critical Limb Ischemia.  The various methods of treatment have been discussed with the patient and family. After consideration of risks, benefits and other options for treatment, the patient has consented to  Procedures: ABDOMINAL AORTOGRAM W/LOWER EXTREMITY (N/A) LOWER EXTREMITY INTERVENTION (N/A) as a surgical intervention.  The patient's history has been reviewed, patient examined, no change in status, stable for surgery.  I have reviewed the patient's chart and labs.  Questions were answered to the patient's satisfaction.     Norman GORMAN Serve

## 2024-02-03 ENCOUNTER — Encounter (HOSPITAL_COMMUNITY): Payer: Self-pay | Admitting: Vascular Surgery

## 2024-02-05 ENCOUNTER — Ambulatory Visit: Admitting: Physician Assistant

## 2024-02-05 DIAGNOSIS — L97519 Non-pressure chronic ulcer of other part of right foot with unspecified severity: Secondary | ICD-10-CM | POA: Diagnosis not present

## 2024-02-05 DIAGNOSIS — S91301A Unspecified open wound, right foot, initial encounter: Secondary | ICD-10-CM | POA: Diagnosis not present

## 2024-02-05 DIAGNOSIS — I739 Peripheral vascular disease, unspecified: Secondary | ICD-10-CM

## 2024-02-06 ENCOUNTER — Encounter: Payer: Self-pay | Admitting: Physician Assistant

## 2024-02-06 NOTE — Progress Notes (Signed)
 "  Office Visit Note   Patient: Chad Grant           Date of Birth: 12/10/1950           MRN: [id number] Visit Date: 02/05/2024              Requested by: Maree Isles, MD [address] [place],  [place] [address] PCP: Maree Isles, MD  Chief Complaint  Patient presents with   Right Foot - Wound Check      HPI: 74 y/o male with a history of left BKA and now has a right heel wound and GT ulcer.  He is non ambulatory and does not talk much.  He nods yes/no answers.  He has a care giver and near the end of the appointment  his wife was able to make it.    They state he is on increased protein intake, but does not et much.  He has a PREVALON  boot to protect the heel.  He was seen by Dr. Pearline on 02/02/24 for angiogram with lithotripsy of right TP trunk and popliteal artery, 5 mm E8 shockwave to improve wound healing.   There is one-vessel runoff to the foot via the peroneal. There is severe pedal disease.  He is maximally revascularized.     Assessment & Plan: Visit Diagnoses:  1. PAD (peripheral artery disease)   2. Non-healing open wound of right heel   3. Ulcer of toe of right foot, unspecified ulcer stage (HCC)     Plan: Chronic heel wound and GT toe ulcer CLI s/p angiogram with single vessel runoff.   I ordered nitrodur patch to help with inflow to heal the heel wound.  No sign of infection.    Clean wounds with Vashe and wet to dry dressing daily.  He is allowed to shower with soap and water.    Follow-Up Instructions: Return in about 3 weeks (around 02/26/2024).   Ortho Exam  Patient is alert, oriented, no adenopathy, well-dressed, normal affect, normal respiratory effort. The heel wound is superficial with evidence of granulation tissue.  Did not probe to bone.  No surrounding cellulitis.  No active drainage.   The dorsal GT ulcer is superficial without drainage or surrounding cellulitis.  Minimal edema.  Claw toe.    Imaging: No results found.     Labs: Lab Results   Component Value Date   HGBA1C 6.2 (H) 08/07/2023   HGBA1C 7.2 (H) 05/13/2018   HGBA1C (H) 08/14/2007    6.3 (NOTE)   The ADA recommends the following therapeutic goal for glycemic   control related to Hgb A1C measurement:   Goal of Therapy:   < 7.0% Hgb A1C   Reference: American Diabetes Association: Clinical Practice   Recommendations 2008, Diabetes Care,  2008, 31:(Suppl 1).   REPTSTATUS 08/12/2023 FINAL 08/07/2023   CULT  08/07/2023    NO GROWTH 5 DAYS Performed at [facility], [address], [place], [place] [address]      Lab Results  Component Value Date   ALBUMIN 3.2 (L) 08/12/2023   ALBUMIN 4.1 07/02/2012    No results found for: MG No results found for: VD25OH  No results found for: PREALBUMIN    Latest Ref Rng & Units 08/17/2023    4:19 AM 08/16/2023    2:09 AM 08/15/2023    3:32 AM  CBC EXTENDED  WBC 4.0 - 10.5 K/uL 5.0  6.4  6.9   RBC 4.22 - 5.81 MIL/uL 3.38  3.26  3.44   Hemoglobin 13.0 - 17.0 g/dL 89.9  9.7  89.9   HCT 60.9 - 52.0 % 32.7  28.8  30.7   Platelets 150 - 400 K/uL 83  116  125      There is no height or weight on file to calculate BMI.  Orders:  No orders of the defined types were placed in this encounter.  No orders of the defined types were placed in this encounter.    Procedures: No procedures performed  Clinical Data: No additional findings.  ROS:  All other systems negative, except as noted in the HPI. Review of Systems  Objective: Vital Signs: There were no vitals taken for this visit.  Specialty Comments:  No specialty comments available.  PMFS History: Patient Active Problem List   Diagnosis Date Noted   Osteomyelitis (neonatal) of jaw (suppurative) 08/07/2023   Osteomyelitis of fifth toe of right foot (HCC) 08/07/2023   Loss of weight 12/15/2018   Change in bowel habits 12/15/2018   PAD (peripheral artery disease) 04/20/2012   Occlusion and stenosis of carotid artery without mention of cerebral infarction  04/20/2012   Depression 02/11/2012   Diabetes type 2 with atherosclerosis of arteries of extremities (HCC) 02/11/2012   HTN (hypertension) 03/19/2011   Past Medical History:  Diagnosis Date   Dementia (HCC)    Depression    Diabetes mellitus type II    Hypercholesteremia    Hypertension    S/P BKA (below knee amputation) unilateral (HCC)    Stroke (HCC) 2013   left sided weakness   TIA (transient ischemic attack)     Family History  Problem Relation Age of Onset   Heart disease Mother    Diabetes Father    Alcohol  abuse Brother    Diabetes Brother    Diabetes Sister    Peripheral vascular disease Sister        amputation   Diabetes Daughter    Anxiety disorder Neg Hx    Bipolar disorder Neg Hx    Dementia Neg Hx    Depression Neg Hx    Drug abuse Neg Hx    Paranoid behavior Neg Hx    Schizophrenia Neg Hx    OCD Neg Hx    Seizures Neg Hx    Sexual abuse Neg Hx    Physical abuse Neg Hx    ADD / ADHD Neg Hx    Colon cancer Neg Hx    Colon polyps Neg Hx     Past Surgical History:  Procedure Laterality Date   ABDOMINAL AORTAGRAM N/A 04/29/2012   Procedure: ABDOMINAL AORTAGRAM;  Surgeon: Gaile LELON New, MD;  Location: [facility] CATH LAB;  Service: Cardiovascular;  Laterality: N/A;   ABDOMINAL AORTOGRAM W/LOWER EXTREMITY N/A 08/11/2023   Procedure: ABDOMINAL AORTOGRAM W/LOWER EXTREMITY;  Surgeon: Pearline Norman RAMAN, MD;  Location: [facility] INVASIVE CV LAB;  Service: Cardiovascular;  Laterality: N/A;   ABDOMINAL AORTOGRAM W/LOWER EXTREMITY N/A 02/02/2024   Procedure: ABDOMINAL AORTOGRAM W/LOWER EXTREMITY;  Surgeon: Pearline Norman RAMAN, MD;  Location: [facility] INVASIVE CV LAB;  Service: Cardiovascular;  Laterality: N/A;   AMPUTATION Right 05/15/2018   Procedure: AMPUTATION RIGHT THIRD TOE;  Surgeon: Blinda Katz, DPM;  Location: [facility] ORS;  Service: Podiatry;  Laterality: Right;   AMPUTATION Right 08/13/2023   Procedure: AMPUTATION, FOOT, RAY;  Surgeon: Harden Jerona GAILS, MD;  Location: [facility] OR;   Service: Orthopedics;  Laterality: Right;  RIGHT  FOOT 5TH RAY AMPUTATION   CATARACT EXTRACTION W/PHACO Left  12/04/2015   Procedure: CATARACT EXTRACTION PHACO AND INTRAOCULAR LENS PLACEMENT LEFT EYE CDE=19.92;  Surgeon: Cherene Mania, MD;  Location: [facility]S;  Service: Ophthalmology;  Laterality: Left;  left   cataract removed Bilateral    left leg amputa     BKA   left leg amputated     LOWER EXTREMITY INTERVENTION  08/11/2023   Procedure: LOWER EXTREMITY INTERVENTION;  Surgeon: Pearline Norman RAMAN, MD;  Location: [facility] INVASIVE CV LAB;  Service: Cardiovascular;;   LOWER EXTREMITY INTERVENTION N/A 02/02/2024   Procedure: LOWER EXTREMITY INTERVENTION;  Surgeon: Pearline Norman RAMAN, MD;  Location: [facility] INVASIVE CV LAB;  Service: Cardiovascular;  Laterality: N/A;   SHOULDER SURGERY Right    rotator cuff   Social History   Occupational History   Not on file  Tobacco Use   Smoking status: Never    Passive exposure: Never   Smokeless tobacco: Never  Vaping Use   Vaping status: Never Used  Substance and Sexual Activity   Alcohol  use: No    Comment: in the past but not since amputation in 2009, drank alcohol  on weekends    Drug use: No   Sexual activity: Not Currently       "

## 2024-02-17 ENCOUNTER — Other Ambulatory Visit: Payer: Self-pay

## 2024-02-17 DIAGNOSIS — I739 Peripheral vascular disease, unspecified: Secondary | ICD-10-CM

## 2024-02-26 ENCOUNTER — Ambulatory Visit: Admitting: Physician Assistant

## 2024-03-01 ENCOUNTER — Ambulatory Visit: Admitting: Physician Assistant

## 2024-03-12 ENCOUNTER — Ambulatory Visit (HOSPITAL_COMMUNITY)

## 2024-03-12 ENCOUNTER — Ambulatory Visit

## 2024-03-12 ENCOUNTER — Encounter (HOSPITAL_COMMUNITY)
